# Patient Record
Sex: Female | Born: 1950 | Race: White | Hispanic: No
Health system: Western US, Academic
[De-identification: ages and names within clinical notes are randomized; demographics above are authoritative.]

## PROBLEM LIST (undated history)

## (undated) DIAGNOSIS — G43909 Migraine, unspecified, not intractable, without status migrainosus: Secondary | ICD-10-CM

## (undated) DIAGNOSIS — M84374A Stress fracture, right foot, initial encounter for fracture: Secondary | ICD-10-CM

## (undated) DIAGNOSIS — E079 Disorder of thyroid, unspecified: Secondary | ICD-10-CM

## (undated) DIAGNOSIS — K219 Gastro-esophageal reflux disease without esophagitis: Secondary | ICD-10-CM

## (undated) DIAGNOSIS — D049 Carcinoma in situ of skin, unspecified: Secondary | ICD-10-CM

## (undated) DIAGNOSIS — F32A Depression, unspecified: Secondary | ICD-10-CM

## (undated) DIAGNOSIS — Z8719 Personal history of other diseases of the digestive system: Secondary | ICD-10-CM

## (undated) DIAGNOSIS — F5101 Primary insomnia: Secondary | ICD-10-CM

## (undated) DIAGNOSIS — R109 Unspecified abdominal pain: Secondary | ICD-10-CM

## (undated) DIAGNOSIS — K5900 Constipation, unspecified: Secondary | ICD-10-CM

## (undated) DIAGNOSIS — F419 Anxiety disorder, unspecified: Secondary | ICD-10-CM

## (undated) DIAGNOSIS — R002 Palpitations: Secondary | ICD-10-CM

## (undated) DIAGNOSIS — N390 Urinary tract infection, site not specified: Secondary | ICD-10-CM

## (undated) DIAGNOSIS — M810 Age-related osteoporosis without current pathological fracture: Secondary | ICD-10-CM

## (undated) DIAGNOSIS — M1990 Unspecified osteoarthritis, unspecified site: Secondary | ICD-10-CM

## (undated) DIAGNOSIS — E349 Endocrine disorder, unspecified: Secondary | ICD-10-CM

## (undated) DIAGNOSIS — R519 Headache, unspecified: Secondary | ICD-10-CM

## (undated) DIAGNOSIS — J45909 Unspecified asthma, uncomplicated: Secondary | ICD-10-CM

## (undated) HISTORY — PX: SURGICAL HX OTHER: 99

## (undated) HISTORY — DX: Disorder of thyroid, unspecified: E07.9

## (undated) HISTORY — DX: Primary insomnia: F51.01

## (undated) HISTORY — PX: URETHRAL DILATION/DVIU: SHX5207

## (undated) HISTORY — DX: Endocrine disorder, unspecified: E34.9

## (undated) HISTORY — DX: Urinary tract infection, site not specified: N39.0

## (undated) HISTORY — DX: Depression, unspecified: F32.A

## (undated) HISTORY — DX: Stress fracture, right foot, initial encounter for fracture: M84.374A

## (undated) HISTORY — PX: PR UNLISTED PROCEDURE FOOT/TOES: 28899

## (undated) HISTORY — DX: Gastro-esophageal reflux disease without esophagitis: K21.9

## (undated) HISTORY — DX: Unspecified abdominal pain: R10.9

## (undated) HISTORY — DX: Carcinoma in situ of skin, unspecified: D04.9

## (undated) HISTORY — DX: Personal history of other diseases of the digestive system: Z87.19

## (undated) HISTORY — DX: Age-related osteoporosis without current pathological fracture: M81.0

## (undated) HISTORY — DX: Unspecified asthma, uncomplicated: J45.909

## (undated) HISTORY — DX: Headache, unspecified: R51.9

## (undated) HISTORY — DX: Constipation, unspecified: K59.00

## (undated) HISTORY — DX: Palpitations: R00.2

## (undated) HISTORY — DX: Unspecified osteoarthritis, unspecified site: M19.90

## (undated) HISTORY — PX: PR UNLISTED PROCEDURE LEG/ANKLE: 27899

## (undated) HISTORY — PX: PR UNLISTED PROCEDURE HANDS/FINGERS: 26989

## (undated) HISTORY — DX: Anxiety disorder, unspecified: F41.9

## (undated) MED ORDER — OMEPRAZOLE 20 MG OR CPDR
DELAYED_RELEASE_CAPSULE | ORAL | 0 refills | Status: AC
Start: 2022-04-28 — End: ?

## (undated) MED ORDER — ESTRADIOL 10 MCG VA TABS
10.0000 ug | ORAL_TABLET | VAGINAL | 0 refills | Status: AC
Start: 2022-02-23 — End: ?

## (undated) MED ORDER — ESTRADIOL 10 MCG VA TABS
10.0000 ug | ORAL_TABLET | VAGINAL | Status: AC
Start: 2022-03-13 — End: ?

## (undated) MED ORDER — ESTRADIOL 10 MCG VA TABS
10.0000 ug | ORAL_TABLET | VAGINAL | 0 refills | Status: AC
Start: 2022-01-05 — End: ?

## (undated) MED FILL — Zoledronic Acid IV Soln 5 MG/100ML: INTRAVENOUS | Qty: 100 | Status: AC

## (undated) MED FILL — Ceftriaxone Sodium in Dextrose Inj 20 MG/ML: INTRAVENOUS | Qty: 50 | Status: AC

---

## 2009-09-11 ENCOUNTER — Other Ambulatory Visit: Payer: Self-pay

## 2009-09-18 ENCOUNTER — Other Ambulatory Visit: Payer: Self-pay

## 2012-05-25 ENCOUNTER — Other Ambulatory Visit: Payer: Self-pay

## 2012-06-12 ENCOUNTER — Encounter (INDEPENDENT_AMBULATORY_CARE_PROVIDER_SITE_OTHER): Payer: BLUE CROSS/BLUE SHIELD

## 2012-06-18 ENCOUNTER — Telehealth (INDEPENDENT_AMBULATORY_CARE_PROVIDER_SITE_OTHER): Payer: Self-pay | Admitting: Foot & Ankle Surgery

## 2012-06-18 NOTE — Telephone Encounter (Signed)
Message copied by Jon Billings on Tue Jun 18, 2012 12:04 PM  ------       Message from: Kimber Relic       Created: Tue Jun 18, 2012 11:31 AM       Regarding: Upcoming surgery       Contact: [phone]         She's feeling a little better, but still wants the surgery on June 10. If you have additional questions you can call her, she was just told to call in and tell you a pain status.              Ok to leave message  ------

## 2012-06-18 NOTE — Telephone Encounter (Signed)
Message copied by Jon Billings on Tue Jun 18, 2012 12:05 PM  ------       Message from: Kimber Relic       Created: Tue Jun 18, 2012 11:31 AM       Regarding: Upcoming surgery       Contact: [phone]         She's feeling a little better, but still wants the surgery on June 10. If you have additional questions you can call her, she was just told to call in and tell you a pain status.              Ok to leave message  ------

## 2012-06-25 ENCOUNTER — Telehealth (INDEPENDENT_AMBULATORY_CARE_PROVIDER_SITE_OTHER): Payer: Self-pay | Admitting: Foot & Ankle Surgery

## 2012-06-25 NOTE — Telephone Encounter (Signed)
Message copied by Jon Billings on Tue Jun 25, 2012  2:29 PM  ------       Message from: Lance Morin       Created: Fri Jun 21, 2012  9:32 AM       Contact: [phone]         Pt would like your help to get a commode to use post operatively.              She also wanted to inform you re a boyle that she has had that is now going away.  ------

## 2012-06-25 NOTE — Telephone Encounter (Signed)
Pt went to primary doc to check on boil on groin, it is infected and she suspects staff, results will be back on Thursday  Discussion with Bertell Maria was that she wait on upcoming surgery till cleared from infectious control physician. She will call   On Thursday with update and we will do surgery when she is better

## 2012-06-28 ENCOUNTER — Telehealth (INDEPENDENT_AMBULATORY_CARE_PROVIDER_SITE_OTHER): Payer: Self-pay | Admitting: Foot & Ankle Surgery

## 2012-07-02 NOTE — Telephone Encounter (Signed)
She is now scheduled for sx on June 24th.

## 2012-07-08 ENCOUNTER — Encounter (INDEPENDENT_AMBULATORY_CARE_PROVIDER_SITE_OTHER): Payer: BLUE CROSS/BLUE SHIELD | Admitting: Foot & Ankle Surgery

## 2012-07-16 ENCOUNTER — Other Ambulatory Visit (INDEPENDENT_AMBULATORY_CARE_PROVIDER_SITE_OTHER): Payer: Self-pay | Admitting: Foot & Ankle Surgery

## 2012-07-17 LAB — PATHOLOGY, SURGICAL

## 2012-07-18 ENCOUNTER — Encounter (INDEPENDENT_AMBULATORY_CARE_PROVIDER_SITE_OTHER): Payer: BLUE CROSS/BLUE SHIELD | Admitting: Foot & Ankle Surgery

## 2012-07-22 ENCOUNTER — Ambulatory Visit (INDEPENDENT_AMBULATORY_CARE_PROVIDER_SITE_OTHER): Payer: BLUE CROSS/BLUE SHIELD | Admitting: Foot & Ankle Surgery

## 2012-07-22 VITALS — BP 102/61 | HR 66 | Ht 69.0 in | Wt 150.0 lb

## 2012-07-22 MED ORDER — DOCUSATE SODIUM 100 MG OR CAPS
ORAL_CAPSULE | ORAL | Status: DC
Start: 2012-07-22 — End: 2012-08-08

## 2012-07-22 MED ORDER — HYDROCODONE-ACETAMINOPHEN 5-325 MG OR TABS
ORAL_TABLET | ORAL | Status: DC
Start: 2012-07-22 — End: 2014-09-26

## 2012-07-22 MED ORDER — HYDROXYZINE HCL 25 MG OR TABS
ORAL_TABLET | ORAL | Status: DC
Start: 2012-07-22 — End: 2014-09-26

## 2012-07-22 NOTE — Progress Notes (Signed)
POST-OP VISIT NOTE    LATERALITY & PROCEDURE: left    POST-OP TIME: 6 days    SUBJECTIVE:      Patient Comments:    How is Patient Doing? Well    Pain:  1-3 out of 10   Swelling: Mild   Progressing As Expected: YES   Taking Pain Medications?  YES   WB Status:  NWB w/ posterior splint w/ crutches &  roller-aid       OBJECTIVE:     Patient Status: Feeling Well   Neurologic Status: Intact   Vascular Status: Intact   Dermatologic: Incisions healing well w/ no dehiscence   Edema: Mild   Hematoma: None   Joint ROM:  Good    Alignment: Good    Palpation: PTP at incision sites only      IMPRESSION:  -Post left foot surgery, progressing well.    PLAN:  -Applied sterile compression dressing  -Continue NWB  -Continue strategies to decrease swelling  -RTC in 1 1/2 weeks for suture removal & x-ray review  -Rx- Colace for constipation.

## 2012-07-24 ENCOUNTER — Other Ambulatory Visit (INDEPENDENT_AMBULATORY_CARE_PROVIDER_SITE_OTHER): Payer: Self-pay | Admitting: Foot & Ankle Surgery

## 2012-07-24 ENCOUNTER — Telehealth (INDEPENDENT_AMBULATORY_CARE_PROVIDER_SITE_OTHER): Payer: Self-pay | Admitting: Foot & Ankle Surgery

## 2012-07-24 MED ORDER — MS CONTIN 15 MG OR TBCR
EXTENDED_RELEASE_TABLET | ORAL | Status: DC
Start: 2012-07-24 — End: 2014-09-26

## 2012-07-24 NOTE — Telephone Encounter (Signed)
Patient called in wanting a refill on her medications but she has some issues with the current prescription. She was taking Morphine and Hydrocodone but was asked to discontinue Morphine and double up on Hydrocodone. She said that she is now nauseous and the medication is not lasting through the night. Please call her to discuss medication regimen and then issue a refill based on the medications prescribed.    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

## 2012-07-25 ENCOUNTER — Encounter (INDEPENDENT_AMBULATORY_CARE_PROVIDER_SITE_OTHER): Payer: BLUE CROSS/BLUE SHIELD | Admitting: Foot & Ankle Surgery

## 2012-07-29 ENCOUNTER — Telehealth (INDEPENDENT_AMBULATORY_CARE_PROVIDER_SITE_OTHER): Payer: Self-pay | Admitting: Foot & Ankle Surgery

## 2012-07-29 MED ORDER — HYDROXYZINE HCL 25 MG OR TABS
ORAL_TABLET | ORAL | Status: DC
Start: 2012-07-29 — End: 2014-09-26

## 2012-07-29 MED ORDER — HYDROCODONE-ACETAMINOPHEN 5-325 MG OR TABS
ORAL_TABLET | ORAL | Status: DC
Start: 2012-07-29 — End: 2014-09-26

## 2012-07-29 NOTE — Telephone Encounter (Signed)
Wants more norco and atarax, pain getting under control but still needing some every 6 hours.  Called both into her pharamcy in [place].

## 2012-07-29 NOTE — Telephone Encounter (Signed)
She is calling for a refill of their medication(s). She will run out tomorrow afternoon and needs these filled ASAP    Name of medication: Hydrocodone-Acetaminophen 5-325 MG Oral Tab  AND  HydrOXYzine HCl 25 MG Oral Tab    Pharmacy: [facility] in [place]  Pharmacy phone number: [phone]    *She also has questions about her wrap.

## 2012-07-31 ENCOUNTER — Encounter (INDEPENDENT_AMBULATORY_CARE_PROVIDER_SITE_OTHER): Payer: Self-pay | Admitting: Foot & Ankle Surgery

## 2012-08-01 ENCOUNTER — Ambulatory Visit (INDEPENDENT_AMBULATORY_CARE_PROVIDER_SITE_OTHER): Payer: BLUE CROSS/BLUE SHIELD | Admitting: Foot & Ankle Surgery

## 2012-08-01 VITALS — BP 93/48 | HR 61 | Ht 69.0 in | Wt 150.0 lb

## 2012-08-01 DIAGNOSIS — Z9889 Other specified postprocedural states: Secondary | ICD-10-CM | POA: Insufficient documentation

## 2012-08-01 NOTE — Progress Notes (Signed)
POST-OP VISIT NOTE    LATERALITY & PROCEDURE: left foot & ankle surgery    POST-OP TIME: 2 weeks    SUBJECTIVE:      Patient Comments:    How is Patient Doing? Well    Pain:  2 out of 10   Swelling: Mild   Progressing As Expected: YES   Taking Pain Medications?  YES   WB Status:  NWB w/ posterior splint w/ crutches &  roller-aid       OBJECTIVE:     Patient Status: Feeling Well   Neurologic Status: Intact   Vascular Status: Intact   Dermatologic: Incisions healing well w/ no dehiscence   Edema: Mild   Hematoma: None   Joint ROM:  Good    Alignment: Good    Palpation: minimal    X-ray Ankle 3+ Vw Left    08/01/2012  XRAYS ANKLE, LEFT, WB- AP, MORTISE- post surgical changes noted to medial malleolus post bone cyst excision w/ autogenous bone (allo- and auto-) packing, soft tissue swelling evident     X-ray Foot 3+ Vw Left    08/01/2012  XRAYS FOOT, NWB- AP, LAT, OBLIQUE- anatomic alignment, post surgical changes noted without complication, os peroneum is absent, soft tissue swelling evident     IMPRESSION:  -Post left foot & ankle surgery, progressing well.    PLAN:  -Reviewed x-rays w/ patient  -Removed partial sutures  -Applied sterile compression dressing w steri-strip reinforcement  -Continue NWB  -Continue strategies to decrease swelling  -RTC in 1 week for additional suture removal.

## 2012-08-02 ENCOUNTER — Telehealth (INDEPENDENT_AMBULATORY_CARE_PROVIDER_SITE_OTHER): Payer: Self-pay | Admitting: Foot & Ankle Surgery

## 2012-08-02 NOTE — Telephone Encounter (Signed)
Pt is concerned, she is 2 wks post op with Dr. Bertell Maria and has a low grade fever. Pls advise what she should do.

## 2012-08-02 NOTE — Telephone Encounter (Signed)
Pt called again, she took her temp again and it seemed fine now. No need to call. She is aware that she can reach the doctor via our answering service if her temp returns over the weekend.

## 2012-08-08 ENCOUNTER — Ambulatory Visit (INDEPENDENT_AMBULATORY_CARE_PROVIDER_SITE_OTHER): Payer: BLUE CROSS/BLUE SHIELD | Admitting: Foot & Ankle Surgery

## 2012-08-08 VITALS — BP 140/70 | HR 60 | Ht 69.0 in | Wt 150.0 lb

## 2012-08-08 MED ORDER — DOCUSATE SODIUM 100 MG OR CAPS
ORAL_CAPSULE | ORAL | Status: DC
Start: 2012-08-08 — End: 2014-09-26

## 2012-08-08 NOTE — Progress Notes (Signed)
POST-OP VISIT NOTE     LATERALITY & PROCEDURE: left foot & ankle surgery     POST-OP TIME: 3 weeks     SUBJECTIVE:   Patient Comments:   How is Patient Doing? Well   Pain: 0 out of 10   Swelling: Mild   Progressing As Expected: YES   Taking Pain Medications? YES   WB Status: NWB w/ posterior splint w/ crutches & roller-aid     OBJECTIVE:   Patient Status: Feeling Well   Neurologic Status: Intact   Vascular Status: Intact   Dermatologic: Incisions healing well w/ no dehiscence   Edema: Mild   Hematoma: None   Joint ROM: Good   Alignment: Good   Palpation: minimal    IMPRESSION:   -Post left foot & ankle surgery, progressing well.     PLAN:   -Removed remaining sutures   -Applied sterile compression dressing w steri-strip reinforcement   -Continue NWB   -Continue strategies to decrease swelling   -RTC in 3 weeks for FU.

## 2012-08-29 ENCOUNTER — Ambulatory Visit (INDEPENDENT_AMBULATORY_CARE_PROVIDER_SITE_OTHER): Payer: BLUE CROSS/BLUE SHIELD | Admitting: Foot & Ankle Surgery

## 2012-08-29 VITALS — BP 130/76 | HR 56 | Ht 69.0 in | Wt 150.0 lb

## 2012-08-29 NOTE — Progress Notes (Signed)
POST-OP VISIT NOTE   LATERALITY & PROCEDURE: left foot & ankle surgery   POST-OP TIME: 6 weeks       SUBJECTIVE:   Patient Comments: Doing "very well"  How is Patient Doing? Well   Pain: 0 out of 10   Swelling: Mild   Progressing As Expected: YES   Taking Pain Medications? YES   WB Status: NWB w/ posterior splint w/ crutches & roller-aid     OBJECTIVE:   Patient Status: Feeling Well   Neurologic Status: Intact   Vascular Status: Intact   Dermatologic: Incisions healing well w/ no dehiscence   Edema: Mild   Hematoma: None   Joint ROM: Good   Alignment: Good   Palpation: minimal     IMPRESSION:   -Post left foot & ankle surgery, progressing well.     PLAN:   -Removed remaining sutures   -Applied sterile compression dressing w steri-strip reinforcement   -Progress to SLW boot   -Continue strategies to decrease swelling  -Begin formal PT program   -RTC in 6 weeks for FU.

## 2012-08-30 ENCOUNTER — Encounter (INDEPENDENT_AMBULATORY_CARE_PROVIDER_SITE_OTHER): Payer: Self-pay | Admitting: Foot & Ankle Surgery

## 2012-09-02 ENCOUNTER — Telehealth (INDEPENDENT_AMBULATORY_CARE_PROVIDER_SITE_OTHER): Payer: Self-pay | Admitting: Foot & Ankle Surgery

## 2012-09-02 NOTE — Telephone Encounter (Signed)
Tried walking with boot after 6 week p/o, did well, few days later pain with walking in boot, recommended cutting way back in activity level,   Use walker mostly, ice, elevate, anti inflam,rest, try small steps, see how does in next few days, call here Friday, if not better.  Will get her in to see bouche

## 2012-09-02 NOTE — Telephone Encounter (Signed)
Cassie Contreras is a post op patient and was seen and given a walking boot on Thursday.  Everything was great up until Sunday morning.  She could barely take a step on Sunday.  Jasper started feeling sharp, stabbing pain in her ankle.  Please call and advise.

## 2012-09-09 ENCOUNTER — Telehealth (INDEPENDENT_AMBULATORY_CARE_PROVIDER_SITE_OTHER): Payer: Self-pay | Admitting: Foot & Ankle Surgery

## 2012-09-09 NOTE — Telephone Encounter (Signed)
Tovah had surgery on her left foot back in June; she is still wearing her black boot and it's causing her pain. She would like to know if it's ok to wear the blue boot. Her next appointment isn't until 9/22 and she would like to know if Dr. Bertell Maria wants her to come in sooner. Please give her a call.

## 2012-09-10 NOTE — Telephone Encounter (Signed)
Shin splints, achil sore, after walking on it, doing PT,  (could walk better first 3 days, then pain) told her not to walk in blue boot  Try black one with crutches , then athletic shoes, will keep [place] posted.   Maybe switch out black boot, could be a bad boot,  Pt working with her

## 2012-09-19 ENCOUNTER — Encounter (INDEPENDENT_AMBULATORY_CARE_PROVIDER_SITE_OTHER): Payer: Self-pay | Admitting: Foot & Ankle Surgery

## 2012-10-14 ENCOUNTER — Ambulatory Visit (INDEPENDENT_AMBULATORY_CARE_PROVIDER_SITE_OTHER): Payer: BLUE CROSS/BLUE SHIELD | Admitting: Foot & Ankle Surgery

## 2012-10-14 VITALS — BP 105/60 | Ht 69.0 in | Wt 150.0 lb

## 2012-10-14 NOTE — Progress Notes (Signed)
POST-OP VISIT NOTE    LATERALITY & PROCEDURE: left foot and ankle surgery    POST-OP TIME: 3 months    SUBJECTIVE:      Patient Comments: anterior ankle tightness, poorly localized foot pain and stiffness   How is Patient Doing? Well    Pain:  0-3 out of 10   Swelling: Mild   Progressing As Expected: YES   Taking Pain Medications?  NO   WB Status:  normal shoes, normal activities, tolerance for walking is 15 minutes    OBJECTIVE:     Patient Status: Feeling Well   Neurologic Status: Intact   Vascular Status: Intact   Dermatologic: Incisions healed well   Edema: Mild   Hematoma: None   Joint ROM:  Good    Alignment: Good    Palpation: no pain    XRAYS:     X-ray Ankle 3+ Vw Left    10/14/2012   XRAY ANKLE, WB- AP, MORTISE, LATERAL- anatomic alignment, post surgical changes noted medial malleolus with intact joint space, small cystic change noted on mortise view, no complicatons noted, soft tissue swelling noted, no anterior spurring    X-ray Foot 2 Vw Left    10/14/2012   XRAY FOOT, WB- AP, OBLIQUE- os peroneum note present, otherwise anatomic alignment without problems noted    IMPRESSION:  -Post left foot & ankle surgery, progressing well.  -Anterior ankle swelling, r/o ankle impingement  -Poorly localized FF/MF stiffness and discomfort, unknown etiology    PLAN:  -Continue PT for aggressive joint mobilization  -Continue strategies to decrease swelling  -RTC in 3 months for FU

## 2012-11-07 ENCOUNTER — Telehealth (INDEPENDENT_AMBULATORY_CARE_PROVIDER_SITE_OTHER): Payer: Self-pay | Admitting: Foot & Ankle Surgery

## 2012-11-07 NOTE — Telephone Encounter (Signed)
SUBJECT: General Message   REASON FOR REQUEST: Referral to PT     MESSAGE: Patient is requesting that a new referral be faxed to [facility].     Ph: [phone]  Fax: [phone]    Return Call:  No call back needed

## 2012-11-08 NOTE — Telephone Encounter (Signed)
Done

## 2012-12-02 ENCOUNTER — Ambulatory Visit (INDEPENDENT_AMBULATORY_CARE_PROVIDER_SITE_OTHER): Payer: BLUE CROSS/BLUE SHIELD | Admitting: Foot & Ankle Surgery

## 2012-12-02 VITALS — BP 124/67 | HR 59 | Ht 69.0 in | Wt 150.0 lb

## 2012-12-02 NOTE — Progress Notes (Signed)
[id number]

## 2012-12-03 NOTE — Progress Notes (Signed)
Cassie Contreras, Cassie Contreras          [id number]          12/02/2012        HISTORY OF PRESENT ILLNESS     This patient returns for followup concerning her left foot.  We had perform recent surgery on this patient's left ankle with good result, though she has some residual numbness on the lateral aspect of her foot.  Other than that, she has done well.  She has been gradually returning to athletic activities in a slow gradual manner.  She has been working with the physical therapist in that regard.  Five days ago, she was doing an exercise involving a leg press and during the exercise she had a sudden onset of pain involving her left 4th metatarsal.  After that exercise, she had difficulty walking and especially at toe off.  She has had pain, swelling and dysfunction, favoring her left side.  The physical therapist who was working with her felt she may have a stress fracture or stress reaction related to her left foot and she presents back now for followup.  In addition to that complaint, she is also getting some anterior ankle pain.  We have previously diagnosed her with an anterior ankle impingement but no previous injections have been rendered.    PHYSICAL EXAMINATION     MUSCULOSKELETAL:  Examination utilizing a skier's cross test, she is indeed limited on the left ankle versus the right related to the amount of dorsiflexion that she gets and is limited on the left side versus the right side.  No effusion or swelling noted.  Good stability of the ankle, pain on the mouth of the ankle involving anterior, central and lateral aspects of the ankle to direct palpation.  She has exquisite pain lateral diaphyseal area 4th metatarsal with minimal swelling.  Good range, good strength, especially of extensor tendon.  Unable to load her foot.  She walks with a significant antalgic gait, propulsive gait.    IMAGING     We did obtain screening films of her foot, and she does have a longitudinal oriented 1 cm periosteal reaction involving  the lateral diathesis proximally consistent with a healing stress fracture involving her 4th metatarsal of the left foot.  This is well seen on AP, oblique views of her foot.  On the lateral view, we can see post-surgical changes involving her tibial plafond without complication.  Her anterior ankle reveals no osseous spurring, despite the fact that she has symptoms of anterior ankle impingement.    IMPRESSION     The patient is status post left ankle surgery, progressing well with residual numbness along the course of the sural nerve.  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    PLAN     Patient education:  Discussed the nature of the problem and options concerning management.  Dispense the walking boot for her.  She can use the previous boot she had, it is a flat walker low to the ground and she should be able to function well.  That and an Ace wrap will be helpful for a 3 to 6 week period.  Proceed with the first of a series of injections for anterior ankle impingement utilizing 3 mL of 0.5% Marcaine, 1 mL of Kenalog 10.  She tolerated the injection well.  We will check her back in 2 weeks before she goes  to, I believe, [place] as she leaves the day after Thanksgiving.  So, it would be nice to get a second injection in her left ankle, as that would optimize her care.  We do anticipate a good result for her with her stress fracture as well.  Overall, the patient progressing well otherwise.

## 2012-12-09 ENCOUNTER — Encounter (INDEPENDENT_AMBULATORY_CARE_PROVIDER_SITE_OTHER): Payer: Self-pay | Admitting: Registered Nurse

## 2012-12-18 ENCOUNTER — Ambulatory Visit (INDEPENDENT_AMBULATORY_CARE_PROVIDER_SITE_OTHER): Payer: BLUE CROSS/BLUE SHIELD | Admitting: Foot & Ankle Surgery

## 2012-12-18 NOTE — Progress Notes (Signed)
Patient returns for 2nd injection left ankle.  First injection worked well providing good relief.  Proceeded with 2nd injection left ankle.    PROCEDURE:  Intra-Articular Ankle Injection    Laterality: left    After antsieptic prep and application of Ethyl Chloride Spray, an intra-articular ankle injection was administered using an anteromedial approach.  4cc's of 0.5% Marcaine was combined with 1cc (10 mg) of triamcinolone. The patient tolerated injection well and was informed about  possibility of post-injection discomfort or "flare" and given treatment recommendations including relative rest, ice and oral Tylenol (not to exceed 3 grams (3000mg s) per day  Discussed other potential side effects as well from this procedure and answered questions patient had.    IMPRESSION:  -Anterior ankle impingment, left  -Stress fx 4th, left  -Post ankle and foot surgery, left     PLAN:   -RTC in 3 months when return from [place]  -Use SLW as needed next 2-4 weeks for stress fx 4th

## 2013-01-29 ENCOUNTER — Ambulatory Visit (INDEPENDENT_AMBULATORY_CARE_PROVIDER_SITE_OTHER): Payer: BLUE CROSS/BLUE SHIELD | Admitting: Foot & Ankle Surgery

## 2013-01-29 DIAGNOSIS — M8448XD Pathological fracture, other site, subsequent encounter for fracture with routine healing: Secondary | ICD-10-CM

## 2013-01-29 DIAGNOSIS — M25872 Other specified joint disorders, left ankle and foot: Secondary | ICD-10-CM

## 2013-01-29 DIAGNOSIS — M7750 Other enthesopathy of unspecified foot: Secondary | ICD-10-CM

## 2013-01-29 DIAGNOSIS — Z9889 Other specified postprocedural states: Secondary | ICD-10-CM

## 2013-01-29 DIAGNOSIS — M84375D Stress fracture, left foot, subsequent encounter for fracture with routine healing: Secondary | ICD-10-CM

## 2013-01-29 NOTE — Progress Notes (Signed)
[id number]

## 2013-01-30 NOTE — Progress Notes (Signed)
Cassie Contreras, Reanne M          [id number]          01/29/2013      SUBJECTIVE     This patient presents back to the clinic for followup concerning her left foot and ankle.  She was last seen on 12/18/2012.  The patient at that time had anterior ankle impingement.  She had a stress fracture of her fourth metatarsal and she was post foot and ankle surgery, generally doing well.  The patient continues to relate issues with her left foot and ankle.  She states that the pain from her fourth metatarsal has resolved.  She has a poorly localized complaint of pain, stiffness involving her central mid foot region.  She states as if it feels like a constricting band surrounds her dorsal mediolateral midfoot.  She is without recent history of injury or trauma. Also relates a neurogenic symptom of tingling and numbness along the course of the superficial peroneal nerve branches and the sural nerve branches, and again she relates no history of injury or trauma.  She states these problems have been present since the surgical procedure. From her surgical procedure on her left ankle with  incisions medially on her ankle and laterally on her foot, she has done reasonably well.  She has some altered sensation along the course of the sural nerve in vicinity of the lateral incision, but the superficial peroneal nerve involvement is not related to any surgical incisional placement.  We did obtain an x-ray of her left foot at this time,  AP, oblique and lateral view, and there is no evidence of any pathology involving her fourth metatarsal at this time. Cursory exam of her left foot revealed no obvious pathology, has good alignment, well-healed incisions.  I did reproduce the tingling she gets along the course of the superficial peroneal nerve and also the sural nerve, but not sure as to the etiology of this problem. Certainly on close questioning, she denies any neurogenic symptoms of her hands or contralateral foot.    Her exam is relatively  normal at this time.    ASSESSMENT AND PLAN     At this point, we proceeded with an additional injection, this being the third injection, 4 mL of 0.5% Marcaine, 1 mL of Kenalog 10 into her left ankle, anteromedial approach.  She will continue with physical therapy and will return here in 4 to 6 weeks for followup.  We will continue to follow her and hopefully she will be able to progress to normal activities as she did prior to this apparent stress fracture she had with her fourth metatarsal.  We would anticipate a good result for her with conservative care.

## 2013-02-04 NOTE — Addendum Note (Signed)
Addended by: Reece AgarBOUCHE, RICHARD THOMAS on: 02/04/2013 02:20 PM     Modules accepted: Orders

## 2013-03-05 ENCOUNTER — Ambulatory Visit (INDEPENDENT_AMBULATORY_CARE_PROVIDER_SITE_OTHER): Payer: BLUE CROSS/BLUE SHIELD | Admitting: Foot & Ankle Surgery

## 2013-03-05 DIAGNOSIS — Z9889 Other specified postprocedural states: Secondary | ICD-10-CM

## 2013-03-05 DIAGNOSIS — M25872 Other specified joint disorders, left ankle and foot: Secondary | ICD-10-CM

## 2013-03-05 DIAGNOSIS — M7750 Other enthesopathy of unspecified foot: Secondary | ICD-10-CM

## 2013-03-05 NOTE — Progress Notes (Signed)
[id number]

## 2013-03-05 NOTE — Progress Notes (Signed)
Cassie Contreras, Keturah M          [id number]          03/05/2013      REASON FOR VISIT     This patient returns back to the clinic for followup concerning her left foot and ankle.    INTERVAL HISTORY     Overall, she is improved.  She has resorted to a gradual return to full activity, and this has been helpful for her.  This was recommended by her primary care physician, apparently.  The patient has done well with the previous injections for her anterior ankle and this part of her problem seems to be better. She can assume a skier's crouch position with minimal to no pain.  She is getting improvement as well from her apparent neurogenic symptoms from her intermediate dorsal cutaneous nerve of the superficial peroneal nerve.  Likely this was due to a compression episode postoperatively.  She is 7 months now status post left ankle surgery and continues to do well with that.  The patient still has residual discomfort involving her fourth metatarsal despite normal x-rays. This was the site of previous stress fracture and I feel that that should heal with a slow gradual return to full weightbearing activities.    IMPRESSION     Our impression at this point is resolving anterior ankle impingement.  She is 7 months status post left ankle surgery, progressing well.  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  Likely this could be a stress reaction.    PLAN     At this point, return to clinic in 3 months for followup.  She is to maintain a consistent progressive loading program and I feel the patient should do well.

## 2013-06-04 ENCOUNTER — Encounter (INDEPENDENT_AMBULATORY_CARE_PROVIDER_SITE_OTHER): Payer: BLUE CROSS/BLUE SHIELD | Admitting: Foot & Ankle Surgery

## 2013-06-05 ENCOUNTER — Ambulatory Visit (INDEPENDENT_AMBULATORY_CARE_PROVIDER_SITE_OTHER): Payer: BLUE CROSS/BLUE SHIELD | Admitting: Foot & Ankle Surgery

## 2013-06-05 DIAGNOSIS — M25872 Other specified joint disorders, left ankle and foot: Secondary | ICD-10-CM

## 2013-06-05 DIAGNOSIS — Z9889 Other specified postprocedural states: Secondary | ICD-10-CM

## 2013-06-05 DIAGNOSIS — M7750 Other enthesopathy of unspecified foot: Secondary | ICD-10-CM

## 2013-06-05 DIAGNOSIS — M25579 Pain in unspecified ankle and joints of unspecified foot: Secondary | ICD-10-CM

## 2013-06-05 DIAGNOSIS — M25572 Pain in left ankle and joints of left foot: Secondary | ICD-10-CM

## 2013-06-05 NOTE — Progress Notes (Signed)
[id number]

## 2013-06-05 NOTE — Progress Notes (Signed)
Cassie Contreras, Ayano M          [id number]          06/05/2013      HISTORY OF PRESENT ILLNESS     Patient returns to the clinic.  It has been about 3 months since I have seen her with her last visit being 03/05/2013.  At this point, the patient is doing very well concerning her left ankle.  The previous problems we encountered on 03/05/2013 have pretty much improved or resolved significantly, that being anterior ankle impingement.  Though she gets that, it is intermittent and mildly a problem now.  She is able to assume a crouch position with minimal to no pain involving her left ankle, and she is quite pleased with that.  She also had neuropraxia of the superficial peroneal nerve.  This is rarely a problem.  Sometimes with constricting shoe wear she can get some residual symptoms, but overall that is improving and just about nonexistent, and then, finally, she had a stress reaction status post stress fracture.  She had symptoms of her fourth metatarsal status post stress fracture, and this has resolved.  The patient walks 50 minutes every other day and is really without pain at this time or any complaint.    PHYSICAL EXAMINATION     GENERAL:  Patient in no acute distress.  NEUROVASCULAR:  Status grossly intact.  DERMATOLOGIC:  Well-healed incisions.  MUSCULOSKELETAL:  No effusions or swellings are noted.  She has excellent range of motion, excellent strength, adequate ankle joint dorsiflexion.  Able to assume a skier's crouch position without difficulty, and she has no ankle instability on provocative testing.  In the stance position, she has a perpendicular heel with well-maintained arch.    IMPRESSION     Our impression at this point is patient is progressing well status post left ankle surgery and is without complaint at this time.  She is status post neuropraxia superficial peroneal nerve, status post ankle impingement, and status post stress reaction fourth metatarsal.  She will return as necessary for followup, and  I did underscore the importance of consistency in her exercise program, especially the weightbearing exercise program.

## 2013-09-01 ENCOUNTER — Telehealth (INDEPENDENT_AMBULATORY_CARE_PROVIDER_SITE_OTHER): Payer: Self-pay | Admitting: Foot & Ankle Surgery

## 2013-09-01 NOTE — Telephone Encounter (Signed)
Cassie Contreras was seen last November for her L foot by Dr. Bertell MariaBouche.  She thinks she may have another stress fracture.  She was at a wedding and was on her feet all day.  She was wearing flats with no arch support.  Cassie Contreras is experiencing sharp pain in her L foot.  Please call her and advise.

## 2013-09-01 NOTE — Telephone Encounter (Signed)
Recommended pt go to ER, walk in, [place]iatrist [place]([facility]) or get back in boot and stay off of it until feeling better.

## 2013-09-17 ENCOUNTER — Telehealth (INDEPENDENT_AMBULATORY_CARE_PROVIDER_SITE_OTHER): Payer: Self-pay | Admitting: Foot & Ankle Surgery

## 2013-09-17 NOTE — Telephone Encounter (Signed)
Apt made

## 2013-09-17 NOTE — Telephone Encounter (Signed)
Patient had surgery with Dr. Bertell Maria on her left foot last year and is now in horrible pain.  Patient was wondering if she can be seen sooner than the end of September.  Please advise, thanks.

## 2013-09-23 ENCOUNTER — Telehealth (INDEPENDENT_AMBULATORY_CARE_PROVIDER_SITE_OTHER): Payer: Self-pay | Admitting: Foot & Ankle Surgery

## 2013-09-23 ENCOUNTER — Ambulatory Visit (INDEPENDENT_AMBULATORY_CARE_PROVIDER_SITE_OTHER): Payer: BLUE CROSS/BLUE SHIELD | Admitting: Foot & Ankle Surgery

## 2013-09-23 DIAGNOSIS — M792 Neuralgia and neuritis, unspecified: Secondary | ICD-10-CM

## 2013-09-23 DIAGNOSIS — IMO0002 Reserved for concepts with insufficient information to code with codable children: Secondary | ICD-10-CM

## 2013-09-23 NOTE — Telephone Encounter (Signed)
Patient called checking on her prescription for Tramadol.  Please call patient once prescription is ready to pick up.

## 2013-09-23 NOTE — Progress Notes (Signed)
[id number]

## 2013-09-24 ENCOUNTER — Encounter (INDEPENDENT_AMBULATORY_CARE_PROVIDER_SITE_OTHER): Payer: Self-pay

## 2013-09-24 MED ORDER — TRAMADOL HCL 50 MG OR TABS
50.0000 mg | ORAL_TABLET | Freq: Four times a day (QID) | ORAL | Status: DC | PRN
Start: 2013-09-24 — End: 2014-09-26

## 2013-09-24 NOTE — Progress Notes (Signed)
[facility] script to [facility], they will contact pt to dispense

## 2013-09-24 NOTE — Progress Notes (Signed)
Cassie Contreras, Cassie Contreras          [id number]          09/23/2013      HISTORY OF PRESENT ILLNESS     This patient is a 63 year old female who presents with her husband, who is well known to me.  She presents with persistent pain, left foot and ankle.  The patient states that since having her previous foot and ankle surgery approximately a year ago she has had some altered sensation to her feet that has been quite manageable.  Three weeks ago, she did attend a wedding and wore a pair of flat, poorly supportive shoes and from this episode, she developed a sudden onset of neurogenic symptoms of her left foot and ankle.  She describes tingling, burning numbing night pain and muscle spasms.  It affects the top of her foot and the anterior aspect of the ankle.  She had no specific history of injury or trauma other than her wearing these shoes.  Up to that point, she was walking 45 minutes per day without problem.  The patient did see Dr. Oswaldo Done on 09/02/2013, and a followup visit on 09/17/2013.  I do have Dr. Jacqualin Combes chart notes and these were thoroughly reviewed.  At the initial visit on 09/02/2013,  X-rays were taken and Dr. Oswaldo Done felt the patient had a stress reaction to her 3rd metatarsal, left foot.  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  Treatment was initiated for the patient including the use of a walking boot.  The patient has been utilizing the walking boot and has persistent continued neurogenic symptoms involving the distribution of the superficial peroneal nerve.  The patient states that other than that episode where she went to the wedding in these flat shoes and the fact that she has been wearing this walking boot with some constricting straps to cross the anterior ankle dorsum of foot, no other history of injury is noted.  The patient does relate chronic low back pain with apparently issues at the L4-L5 level where  she was told by one of her physicians that she had degenerative joint disease.  Again, she does relate some neurogenic symptoms since that her foot/ankle surgery a year ago on the left side, but this exacerbation of her neurogenic symptoms occurred 3 weeks ago without obvious history of injury or trauma.  She denies any other neurogenic symptoms of her body other than her chronic low back pain.  This history is poorly related by the patient and I am not sure if she had any specific diagnostic studies done but on initial questioning, there does not appear to have been any studies done such as an MRI or EMG nerve conduction study to evaluate her back.    The patient is anxious to wean out of the boot.  She was apparently told by Dr. Oswaldo Done that she could wean out if she felt okay.  She did try and wean out and had some exacerbation of her neurogenic symptoms most recently, so she got back in the boot.  She is questioning as to what course of action she should take at this time.    PHYSICAL EXAMINATION     GENERAL:  The patient in no acute distress.  VASCULAR:  Status grossly intact.  NEUROLOGIC:  She has altered sensation in the distribution of the sural nerve, medial and lateral intermediate branches; medial and intermediate dorsal  cutaneous nerve branches are altered.  She has intact saphenous, sural, and posterior tibial nerve distribution.  She has loss of S1 reflex on the left side preserved with the Achilles bilaterally and the right S1 reflex.  She does have atrophy of her left calf.  We measured her left calf circumference, left versus right, 6 cm and 12 cm; on the left side it is 6 cm, 35 cm circumference, 35 on the right as well; 12 cm below the popliteal crease, she was 33.5 cm on the left and 34.5 cm on the right.  So, there is a 1 cm difference left versus right, with the left being visually slightly atrophic at this level.  She has good range of motion, excellent strength with no obvious weakness  appreciated.  We were able to appreciate the medial dorsal cutaneous nerve which was quite large and a smaller intermediate dorsal cutaneous nerve and there were reproducible symptoms with palpation of the nerve with proximal and distal radiation about the area of the instep.  Also, she had exacerbation of these neurogenic symptoms in the dorsal metatarsophalangeal joint region over the 2nd and 3rd rays.  She has no obvious swelling on examination.  She is tender over the metatarsal shafts but no specific areas of pinpoint pain are noted.  The patient is able to load her heel with assistance from the other side and independent of the other side; she can even perform some light jumping with no reproduction of symptoms.    IMAGING     We did review the previous x-ray taken per Dr. Oswaldo Done, the most recent film, which reveals some wash out of the bones and no obvious osseous pathology is noted other than the loss of bone density.    IMPRESSION     The patient has the onset of neurogenic symptoms possibly aggravated by shoe wear or possibly use of a walking boot in the distribution of the superficial peroneal nerve branches, left foot only.  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  She has loss of S1 reflex and muscle atrophy to support that view.    PLAN     Patient education.  Discussed the nature of the problem and options concerning management.  Based on Dr. Jacqualin Combes recommendations, I would keep her in the boot for a week just to assure that this stress reaction is healing appropriately.  I cautioned her against overtightening the straps which could cause aggravation of her nerve symptoms, or neuropraxia so to speak, of the superficial peroneal nerve branches.  I did discuss the tincture of time without constriction should improve week to  week and month to month.  I discussed simple conservative measures.  Maybe a night splint would be beneficial to keep tension off of the nerves with a neutral dorsiflexed position.  She is to avoid any kind of constricting strap that cross the instep area and anterior aspect of her ankle.  The patient would be a candidate for tramadol to be taken as needed for discomfort and if she has continued neurogenic symptoms, we could try a course of gabapentin or Neurontin.  I would give this a good 3-4 weeks period of time and see if she improves.  If she does not improve and has persistent symptoms, I would see Gustavo Lah for evaluation of her back  to see if her foot symptoms in the L5 distribution are related to an L4-L5 radicular problem.  Should she have any problems, she will return earlier.  We would anticipate improvement with conservative measures and we will see her back appropriately.    TIME STATEMENT     40 minutes were spent face-to-face contact with patient, greater then 50% of which was spent counseling and coordinating care.

## 2013-10-04 ENCOUNTER — Telehealth (INDEPENDENT_AMBULATORY_CARE_PROVIDER_SITE_OTHER): Payer: Self-pay | Admitting: Foot & Ankle Surgery

## 2013-10-04 NOTE — Telephone Encounter (Signed)
Patient called and wanted to know if Dr. Bertell Maria could recommend a Physical Therapist that we normally refer to. I did suggest that we often refer to [facility]. She said she tried [facility] and didn't like it. She asked if he knew anywhere in [place] and if not she said she is willing to go to [facility].

## 2013-10-06 NOTE — Telephone Encounter (Signed)
Referred her to [place] , she took number and will call

## 2013-10-09 ENCOUNTER — Telehealth (INDEPENDENT_AMBULATORY_CARE_PROVIDER_SITE_OTHER): Payer: Self-pay | Admitting: Foot & Ankle Surgery

## 2013-10-09 NOTE — Telephone Encounter (Signed)
Pt called and would like to go forward with scheduling an EMG. Dr. Bertell Maria recommended this for the peripheral neuropathy in her feet. She indicates that this would be for her L Foot and also possibly for the R Foot. Additionally she has been experiencing numbness in her hands so she is not sure which limbs should be included in the EMG. Would Dr. Bertell Maria write an order for this?

## 2013-10-13 NOTE — Telephone Encounter (Signed)
Referred to dr April Manson for work up neurapathy, to discuss emg study

## 2013-10-14 ENCOUNTER — Telehealth (INDEPENDENT_AMBULATORY_CARE_PROVIDER_SITE_OTHER): Payer: Self-pay | Admitting: Foot & Ankle Surgery

## 2013-10-14 NOTE — Telephone Encounter (Signed)
Cassie Contreras has a ingrown toenail on her left foot. She noticed this morning that her toe is infected. She would like to know what to do. She informed me that Dr. Bertell Maria referred her to Dr. Neil Crouch to be checked for neuropathy, she has an appointment at the end of October. Please give patient a call, she would like to know if she has to come in or can something be prescribed. Please advise.

## 2013-10-14 NOTE — Telephone Encounter (Signed)
Left message for pt to call me back if not before 2pm today, tomorrow am

## 2013-10-20 ENCOUNTER — Ambulatory Visit (INDEPENDENT_AMBULATORY_CARE_PROVIDER_SITE_OTHER): Payer: BLUE CROSS/BLUE SHIELD | Admitting: Foot & Ankle Surgery

## 2013-10-20 DIAGNOSIS — M792 Neuralgia and neuritis, unspecified: Secondary | ICD-10-CM

## 2013-10-20 DIAGNOSIS — IMO0002 Reserved for concepts with insufficient information to code with codable children: Secondary | ICD-10-CM

## 2013-10-20 DIAGNOSIS — L600 Ingrowing nail: Secondary | ICD-10-CM | POA: Insufficient documentation

## 2013-10-20 NOTE — Progress Notes (Signed)
[id number]      PARTIAL MATRICECTOMY:     Digit Involved: Hallux      Laterality: LEFT     Nail Borders: lateral    A local anesthetic block was administered of involved hallux using a field block technique. A total of 4 cc's of 0.5% Marcaine was used for left hallux block.  After painting the hallux with Betadine solution, a penrose drain was applied circumferentially aroung base of hallux and this tourniquet was maintained in place for duration of procedure. The offending portion of nail was then removed in standard fashion using sterile instrumentation.  Matricectomy was then performed on the involved border/s using Negative Galvanic Current at a setting of 10 milliamperes for a duration of 5 minutes per border. The treated nail groove was then lavaged first with sterile 5% acetic acid solution for chemical neutralization followed by normal saline. A dressing was applied to hallux consisting of betadine-soaked adaptic, 4 x 4 gauze, 1" gauze and a Coban wrap. During dressing application the penrose drain tourniquet was released.& removed from involved halllux(ces).  Patient was given verbal & written post-operative instructions. Extra-strength Tylenol ( ) was recommended for discomfort but should not exceed 3 grams (3000 mgs) per day. A follow-up appointment was scheduled.Marland Kitchen

## 2013-10-20 NOTE — Progress Notes (Signed)
REWA, WEISSBERG          [id number]          10/20/2013        HISTORY OF PRESENT ILLNESS     This patient is a 63 year old female who returns back to the clinic for followup concerning multiple issues involving her lower extremities.  She states her neurogenic symptoms are stable, though the patient has had neurogenic symptoms now for 1 year.  On close questioning, she does relate neurogenic symptoms involving her hands and both of her lower extremities.  On the left side, she has neurogenic symptoms involving the anterolateral leg and dorsal lateral foot in the distribution of the common peroneal nerve.  She did take nitrofurantoin antibiotic a year ago for one year for a bladder issue she had and certainly there is cause and effect there related to nitrofurantoin causing neuropathic symptoms, but nonetheless, her neuropathic symptoms are now stable.  I did discuss with her previously specifics of peripheral neuropathy, etiologies, potential causes and treatment implications.  We do recommend that this patient see Dr. Lennox Grumbles for validation that this is happening to her and for treatment options.  Her main complaint on this visit is ingrown nail fibular border, left hallux, 63-month duration.  It has been a recurrent problem.  She has had purulence, most recently about 3 to 4 weeks ago emanate from the lateral nail border, left foot.  She is here for definitive treatment options related to this chronic recurrent problem.    PHYSICAL EXAMINATION     Related to her left toe, there is some slight redness, pain along the lateral nail border.  No evidence of purulence or drainage is noted.  She has an incurvated nail on the right side that is minimally symptomatic.  Main issue is lateral border, left hallux nail.    IMPRESSION     The patient has probable peripheral neuropathy, possibly aggravated or brought on by 1 year of nitrofurantoin about a year ago.  She has chronic recurrent ingrown nail fibular border, left  hallux.    PLAN     Discussed options concerning her hallux nail, fibular border.  Recommended partial matrixectomy.  Did perform that at this time.  Gave her recommendations related to her neuropathy.  Gave her some Metanx samples that she can consider as a supplement and possible treatment for her neuropathic symptoms.  Did recommend that she check this out online first and consider these samples as a trial treatment, as she may get symptomatic relief.  I will check her back in 4-6 weeks for followup concerning her nail procedure, which was performed without complication.  She will eventually see Dr. Lennox Grumbles for her neuropathic symptoms.

## 2013-11-26 ENCOUNTER — Encounter (INDEPENDENT_AMBULATORY_CARE_PROVIDER_SITE_OTHER): Payer: BLUE CROSS/BLUE SHIELD | Admitting: Foot & Ankle Surgery

## 2013-12-03 ENCOUNTER — Telehealth (INDEPENDENT_AMBULATORY_CARE_PROVIDER_SITE_OTHER): Payer: Self-pay | Admitting: Foot & Ankle Surgery

## 2013-12-03 NOTE — Telephone Encounter (Signed)
Cassie Contreras went and saw Cassie Contreras and was prescribed Gabapentin. She states that Cassie Contreras told her to call Cassie Contreras's office to ask about the side effects and dosage. She has become sensitive to light and sleepy. Please advise.

## 2013-12-03 NOTE — Telephone Encounter (Signed)
Side effects are normal , she is taking 100 mg, three times a day, she is to increase every couple of weeks till she can do 600/900 a day,  We will try to monitor this over the phone or change meds all together

## 2014-09-02 ENCOUNTER — Telehealth (INDEPENDENT_AMBULATORY_CARE_PROVIDER_SITE_OTHER): Payer: Self-pay | Admitting: Family Medicine

## 2014-09-02 NOTE — Telephone Encounter (Signed)
LVM with patient. CCR please route patient to [facility] when patient calls to schedule appointment.

## 2014-09-02 NOTE — Telephone Encounter (Signed)
(  TEXTING IS AN OPTION FOR [facility] CLINICS ONLY)  Is this a [facility] clinic? Yes. What is the mobile number we can use to get a hold of you via text? [phone]        RETURN CALL: Detailed message on voicemail only      SUBJECT:  Appointment Request     REASON FOR REQUEST/SYMPTOMS: Establish care/appointment   REFERRING PROVIDER: Juel Burrow In [facility]  REQUEST APPOINTMENT WITH: Clemmie Krill  REQUESTED DATE: Any day except Tuesdays, TIME: Please coordinate with patient  UNABLE TO APPOINTMENT BECAUSE: Patient stated Dr. Juel Burrow in [facility] spoke to Dr. [name] and arranged for her and husband to establish care with Dr. [name]. Per SOP, Dr. He only accepting new patients if family already established with him. Spouse has first appointment to establish on 9/3. Patient is looking to make an appointment with [facility] and establish care. CCR not authorized to establish patient with Hei. Patient is requesting a call back from the clinic to schedule.

## 2014-09-02 NOTE — Telephone Encounter (Signed)
Yes this is fine.

## 2014-09-02 NOTE — Telephone Encounter (Signed)
Routing to Dr. [name] Do you want to take on this pt? Please route to FD

## 2014-09-03 NOTE — Telephone Encounter (Signed)
Appointment made with Dr. [name]. Closing TE.

## 2014-09-26 ENCOUNTER — Encounter (INDEPENDENT_AMBULATORY_CARE_PROVIDER_SITE_OTHER): Payer: Self-pay | Admitting: Family Medicine

## 2014-09-26 ENCOUNTER — Ambulatory Visit (INDEPENDENT_AMBULATORY_CARE_PROVIDER_SITE_OTHER): Payer: PPO | Admitting: Family Medicine

## 2014-09-26 VITALS — BP 103/65 | HR 69 | Temp 97.6°F | Resp 14 | Ht 69.5 in | Wt 155.0 lb

## 2014-09-26 DIAGNOSIS — G5782 Other specified mononeuropathies of left lower limb: Secondary | ICD-10-CM

## 2014-09-26 DIAGNOSIS — M79671 Pain in right foot: Secondary | ICD-10-CM

## 2014-09-26 MED ORDER — LIDOCAINE 4 % EX PTCH
1.0000 | MEDICATED_PATCH | Freq: Every day | CUTANEOUS | Status: DC | PRN
Start: 2014-09-26 — End: 2015-04-15

## 2014-09-26 NOTE — Progress Notes (Signed)
Reason for visit: foot pain       Cervical screening/PAP:   Mammo: Last one at [facility]  Colon Screen: Had 5 years ago per pt  Have seen a specialist since your last visit: NO     HM Due:   Health Maintenance   Topic Date Due   . Hepatitis C Screen  01-21-1951   . HIV Screen  [birth year]   . Pap Smear  11/19/1970   . Cholesterol Test  11/19/1994   . Colon Cancer Screen w/ FOBT/FIT  11/19/1999   . Mammogram  11/19/1999   . Influenza Vaccine (1) 09/24/2014   . Tetanus Vaccine  03/24/2022   . Zoster Vaccine  Completed           No future appointments.

## 2014-09-26 NOTE — Patient Instructions (Signed)
"  Buddy tape" your right 1st and second toes

## 2014-09-29 ENCOUNTER — Encounter (INDEPENDENT_AMBULATORY_CARE_PROVIDER_SITE_OTHER): Payer: Self-pay | Admitting: Family Medicine

## 2014-09-29 DIAGNOSIS — G5782 Other specified mononeuropathies of left lower limb: Secondary | ICD-10-CM | POA: Insufficient documentation

## 2014-09-29 NOTE — Progress Notes (Signed)
ASSESSMENT/PLAN:  (M79.671) Right foot pain  (primary encounter diagnosis)  Plan: X-RAY FOOT 3+ VW RIGHT        Normal xray, may be sprain, adv'd buddy taping and stiffer soled shoes.  f/u prn no improvement.     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         END OF VISIT SUMMARY  PLEASE SEE BELOW FOR SUPPORTING DOCUMENTATION AND DETAILS.   -----------------------------------------------------------------------------------------------------------------    SUBJECTIVE:  Cassie Contreras is a 64 year old female presenting today for the following issue(s):    Increased RIGHT foot pain near the base of 1st toe.  No trauma or injury. Worse w standing/walking and o/w weight bearing.  No swelling.  Shoes w more cushion helps.    Also chronic LEFT lateral foot pain w a long history that ultimately concluded w dx of sural nerve injury from foot surgery based on EMG testing.  She's been taking gabapentin  at bedtime which helps and recalls that taking more made her drowsy.  Daytime symptoms not as bad but can occ have shooting pain.    ROS: no recent fever or rash or edema or echymosis    Past Medical History   Diagnosis Date   . Heart palpitations    . Metatarsal stress fracture of right foot      TX FROM PAPER CHART       Past Surgical History   Procedure Laterality Date   . Tonsilectomy       Childhood      Social History   Substance Use Topics   . Smoking status: Never Smoker    . Smokeless tobacco: Not on file   . Alcohol Use: 0.6 oz/week     1 Glasses of wine per week    No family history on file.  Patient Active Problem List    Diagnosis Date Noted   . 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     . S/P foot & ankle surgery, left [Z98.89] 08/01/2012   . 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 Left ankle pain [M25.572]      TX FROM PAPER CHART            OBJECTIVE:  GEN:  The patient is pleasant, alert, cooperative.  No acute distress.  EXTR: without overlying erythema, edema or echymosis of bilateral feet; healed surgical scars on LEFT; mild-mod tenderness in RIGHT 1st metatarsal joint w no tenderness distally or proximally and distal sensation intact.      RIGHT foot xray performed today personally reviewed by me reveals: no fx or dislocation     -----------------------------------------------------------------------------------------------------------------  ALSO SEE THE BEGINNING OF THIS NOTE FOR ASSESSMENT AND PLAN  -----------------------------------------------------------------------------------------------------------------

## 2014-11-03 ENCOUNTER — Other Ambulatory Visit: Payer: Self-pay

## 2015-04-15 ENCOUNTER — Encounter (INDEPENDENT_AMBULATORY_CARE_PROVIDER_SITE_OTHER): Payer: Self-pay | Admitting: Family Medicine

## 2015-04-15 ENCOUNTER — Ambulatory Visit (INDEPENDENT_AMBULATORY_CARE_PROVIDER_SITE_OTHER): Payer: PPO | Admitting: Family Medicine

## 2015-04-15 VITALS — BP 100/64 | HR 65 | Temp 97.8°F | Resp 20 | Wt 155.2 lb

## 2015-04-15 DIAGNOSIS — R000 Tachycardia, unspecified: Secondary | ICD-10-CM

## 2015-04-15 DIAGNOSIS — M5410 Radiculopathy, site unspecified: Secondary | ICD-10-CM

## 2015-04-15 DIAGNOSIS — N3001 Acute cystitis with hematuria: Secondary | ICD-10-CM

## 2015-04-15 DIAGNOSIS — R300 Dysuria: Secondary | ICD-10-CM

## 2015-04-15 LAB — PR U/A AUTO DIPSTICK ONLY, ONSITE
Bilirubin, Urine: NEGATIVE
Glucose, Urine: NEGATIVE mg/dL
Ketones, URN: NEGATIVE mg/dL
Nitrite, URN: NEGATIVE
Protein: NEGATIVE mg/dL
Specific Gravity, Urine: 1.015 (ref 1.005–1.030)
Urobilinogen, URN: 0.2 E.U./dL (ref 0.2–1.0)
pH, URN: 7 (ref 5.0–8.0)

## 2015-04-15 MED ORDER — NITROFURANTOIN MONOHYD MACRO 100 MG OR CAPS
100.0000 mg | ORAL_CAPSULE | Freq: Two times a day (BID) | ORAL | Status: AC
Start: 2015-04-15 — End: 2015-04-22

## 2015-04-15 NOTE — Progress Notes (Signed)
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  advised to track circumstances around recurrence, if any, to determine what she may avoid to minimize recurrence.  Consider PT if keeps recurring.    (R30.0) Dysuria; (N30.01) Acute cystitis with hematuria  Plan: U/A AUTO DIPSTICK ONLY, ONSITE, Nitrofurantoin         Monohyd Macro (MACROBID) 100 MG Oral Cap        Early sign of UTI. H/o recurrent UTI's in the past.  Given allergies will rx macrobid     (R00.0) Tachycardia  Plan: normal exam, perhaps her FitBit is not as accurate a measuring device.  Also advised to check manual pulse esp paying attention to regularity during exercise.  Also advised that metoprolol and tamsulosin may reduce BP more esp during or after exercise that may stimulate her pulse to increase but it's been rx'd by her previous physician so I advised her to enquire there.      END OF VISIT SUMMARY  PLEASE SEE BELOW FOR SUPPORTING DOCUMENTATION AND DETAILS.   -----------------------------------------------------------------------------------------------------------------    SUBJECTIVE:  Cassie Contreras is a 65 year old female presenting today for the following issue(s):    4-6 weeks of on-off RIGHT lateral leg stinging pain that are quick and can be intense described as "grab the ceiling".  But the past 3-4 days this has not recurred.  No trauma or injury. Seems to happen after a workout followed by standing at work.    Also w her FitBit she's noticed her HR goes up to 160-170 during workouts. She doesn't feel unusual SOB or intolerance or CP during workouts, recovery seems normal although sometimes after a particularly hard workout w trainer she feels like she needs a nap afterwards.   She's been put on metoprolol for palpitations.    Also having some vague symptoms of UTI lately, no fever.    ROS: no light-headedness or CP or N/V/D    Patient Active Problem  List    Diagnosis Date Noted   . 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     . S/P foot & ankle surgery, left [Z98.890] 08/01/2012   . 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  No acute distress.  CV: Regular rate and rhythm. S1 and S2 normal, no murmurs, clicks, gallops or rubs.   MUSCULOSKELETAL: no lumbar tenderness. Slr negative in spine position but her hamstrings are quite tight bilaterally.  DTR 2+ bilateral knees and ankles.      UA: 2+ LE, trace blood, neg nitrite.  See specific lab report for details.     -----------------------------------------------------------------------------------------------------------------  ALSO SEE THE BEGINNING OF THIS NOTE FOR ASSESSMENT AND PLAN  -----------------------------------------------------------------------------------------------------------------

## 2015-04-15 NOTE — Patient Instructions (Signed)
Take your pulse manually at various times of your exercise to check both rate and regularity    Keep monitoring the circumstances around the RIGHT thigh pain

## 2015-04-15 NOTE — Progress Notes (Signed)
Reason for visit: pt. Is here for the pain in right leg on side since a month ago.       Cervical screening/PAP:due  Mammo: done (virgenia mason , 09/16)  Colon Screen: done , 01/17, virgenia mason   Have seen a specialist since your last visit: NO     HM Due:   Health Maintenance   Topic Date Due   . Hepatitis C Screen  1950/07/15   . HIV Screen  1950/07/15   . Pap Smear  11/19/1970   . Cholesterol Test  11/19/1994   . Colon Cancer Screen w/ FOBT/FIT  11/19/1999   . Mammogram  11/19/1999   . DEXA Scan  11/19/2014   . Pneumococcal Vaccine (1 of 2 - PCV13) 11/19/2014   . Tetanus Vaccine  03/24/2022   . Zoster Vaccine  Completed   . Influenza Vaccine  Completed           Future Appointments  Date Time Provider Department Center   04/15/2015 8:30 AM Hei, Darlyn Chamberhomas Kou, MD [facility]

## 2015-04-21 ENCOUNTER — Encounter (INDEPENDENT_AMBULATORY_CARE_PROVIDER_SITE_OTHER): Payer: Medicare Other | Admitting: Family Medicine

## 2015-04-22 ENCOUNTER — Encounter (INDEPENDENT_AMBULATORY_CARE_PROVIDER_SITE_OTHER): Payer: Self-pay | Admitting: Family Medicine

## 2015-04-22 ENCOUNTER — Ambulatory Visit (INDEPENDENT_AMBULATORY_CARE_PROVIDER_SITE_OTHER): Payer: PPO | Admitting: Family Medicine

## 2015-04-22 VITALS — BP 97/61 | HR 60 | Temp 98.0°F | Resp 16 | Wt 155.0 lb

## 2015-04-22 DIAGNOSIS — M79672 Pain in left foot: Secondary | ICD-10-CM

## 2015-04-22 NOTE — Progress Notes (Signed)
ASSESSMENT/PLAN:  (N62.952(M79.672) Left foot pain  (primary encounter diagnosis)  Plan: XR FOOT 3 VW LEFT        No s/s of fx at this time.  Adv'd stiff soled shoes and relative guarding for next few days and resume exercises slowly afterwards.  f/u prn no improvement.        END OF VISIT SUMMARY  PLEASE SEE BELOW FOR SUPPORTING DOCUMENTATION AND DETAILS.   -----------------------------------------------------------------------------------------------------------------    SUBJECTIVE:  Cassie Contreras is a 65 year old female presenting today for the following issue(s):    2 days of sharp LEFT distal dorsal foot pain when wearing tighter shoes or w pressure.  No problems walking but afraid to raise off heels.  Been exercising more vigorously so concerned about stress fx, which she's reportedly had in the past.    ROS: no swelling/rash, has chronic LEFT lateral foot numbness    Patient Active Problem List    Diagnosis Date Noted   . 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     . S/P foot & ankle surgery, left [Z98.890] 08/01/2012   . 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  No acute distress.  EXTR: LEFT foot without overlying erythema, edema or echymosis, but positive point tenderness over 3rd MTP area. FROM.  Ankle mortis stable.    foot xray performed today personally reviewed by me reveals: no fx or dislocation     -----------------------------------------------------------------------------------------------------------------  ALSO SEE THE BEGINNING OF THIS NOTE FOR ASSESSMENT AND PLAN  -----------------------------------------------------------------------------------------------------------------

## 2015-04-22 NOTE — Progress Notes (Signed)
Reason for visit: L foot pain        Cervical screening/PAP:no  Mammo: no  Colon Screen: no  Have seen a specialist since your last visit: NO     HM Due:   Health Maintenance   Topic Date Due   . Hepatitis C Screen  1950-05-09   . HIV Screen  1950-05-09   . Pap Smear  11/19/1970   . Cholesterol Test  11/19/1994   . Colon Cancer Screen w/ FOBT/FIT  11/19/1999   . Mammogram  11/19/1999   . DEXA Scan  11/19/2014   . Pneumococcal Vaccine (1 of 2 - PCV13) 11/19/2014   . Tetanus Vaccine  03/24/2022   . Zoster Vaccine  Completed   . Influenza Vaccine  Completed           Future Appointments  Date Time Provider Department Center   04/22/2015 8:30 AM Hei, Darlyn Chamberhomas Kou, MD [facility]

## 2015-08-13 ENCOUNTER — Encounter (INDEPENDENT_AMBULATORY_CARE_PROVIDER_SITE_OTHER): Payer: Self-pay | Admitting: Family Medicine

## 2015-08-13 NOTE — Progress Notes (Signed)
Called and left voicemail for patient to schedule a medicare annual wellness visit.       CCR please help patient schedule an appointment for a medicare annual wellness visit.  Please put this in note when scheduled "MEDICARE AWV/HCC/Care Gap update"

## 2015-10-21 ENCOUNTER — Ambulatory Visit (INDEPENDENT_AMBULATORY_CARE_PROVIDER_SITE_OTHER): Payer: PPO | Admitting: Family Medicine

## 2015-11-10 ENCOUNTER — Ambulatory Visit (INDEPENDENT_AMBULATORY_CARE_PROVIDER_SITE_OTHER): Payer: PPO | Admitting: Family Medicine

## 2015-11-10 ENCOUNTER — Encounter (INDEPENDENT_AMBULATORY_CARE_PROVIDER_SITE_OTHER): Payer: Self-pay | Admitting: Family Medicine

## 2015-11-10 VITALS — BP 99/54 | HR 61 | Temp 96.3°F | Resp 16 | Ht 67.75 in | Wt 152.0 lb

## 2015-11-10 DIAGNOSIS — N390 Urinary tract infection, site not specified: Secondary | ICD-10-CM | POA: Insufficient documentation

## 2015-11-10 DIAGNOSIS — R002 Palpitations: Secondary | ICD-10-CM | POA: Insufficient documentation

## 2015-11-10 DIAGNOSIS — Z1239 Encounter for other screening for malignant neoplasm of breast: Secondary | ICD-10-CM

## 2015-11-10 DIAGNOSIS — N952 Postmenopausal atrophic vaginitis: Secondary | ICD-10-CM

## 2015-11-10 DIAGNOSIS — Z79890 Hormone replacement therapy (postmenopausal): Secondary | ICD-10-CM

## 2015-11-10 DIAGNOSIS — Z0000 Encounter for general adult medical examination without abnormal findings: Secondary | ICD-10-CM

## 2015-11-10 DIAGNOSIS — Z1231 Encounter for screening mammogram for malignant neoplasm of breast: Secondary | ICD-10-CM

## 2015-11-10 DIAGNOSIS — M8588 Other specified disorders of bone density and structure, other site: Secondary | ICD-10-CM | POA: Insufficient documentation

## 2015-11-10 DIAGNOSIS — Z6823 Body mass index (BMI) 23.0-23.9, adult: Secondary | ICD-10-CM

## 2015-11-10 LAB — LIPID PANEL
Cholesterol (LDL): 71 mg/dL (ref <130)
Cholesterol/HDL Ratio: 2.1
HDL Cholesterol: 75 mg/dL (ref >39)
Non-HDL Cholesterol: 83 mg/dL (ref 0–159)
Total Cholesterol: 158 mg/dL (ref <200)
Triglyceride: 58 mg/dL (ref <150)

## 2015-11-10 NOTE — Progress Notes (Signed)
Reason for visit: wellness        Cervical screening/PAP: due  Mammo: due  Colon Screen:  due  Have seen a specialist since your last visit: NO     HM Due:   Health Maintenance   Topic Date Due   . Hepatitis C Screen  15-Jun-1950   . HIV Screen  11/18/1964   . Pap Smear  11/19/1970   . Cholesterol Test  11/19/1994   . Colon Cancer Screen w/ FOBT/FIT  11/19/1999   . Mammogram  11/19/1999   . DEXA Scan  11/19/2014   . Pneumococcal Vaccine (1 of 2 - PCV13) 11/19/2014   . Influenza Vaccine (1) 10/24/2015   . Tetanus Vaccine  03/24/2022   . Zoster Vaccine  Completed           No future appointments.  c    WELCOME TO MEDICARE VISIT     Cassie Contreras presents for a Welcome to Medicare visit (Initial Preventive Physical Exam).     Rooming activities:  Visual acuity testing measured: YES     Visual Acuity Screening    Right eye Left eye Both eyes   Without correction:      With correction: 20/25 20/100 20/30      Height and weight measured: YES  PHQ-2 responses entered into screening section: YES  PHQ-9 given if PHQ-2 score is >0: YES       QUESTIONNAIRE REPONSES:   DIET/EXERCISE  Is your diet well-balanced/healthy including protein, fiber, vegetables, and fruits? YES   Do you exercise regularly? YES    Type of exercise aerobics   Frequency of exercise 5 times a week     ACTIVITIES OF DAILY LIVING  In your present state of health how much difficulty do you have with the following activities:       Preparing food and eating: 0 No impairment (the person has no problem)       Bathing yourself: 0 No impairment (the person has no problem)       Getting dressed: 0 No impairment (the person has no problem)       Using the toilet: 0 No impairment (the person has no problem)       Moving around from place to place: 0 No impairment (the person has no problem)    In the past year have you fallen or had a near fall? No  Do you feel safe in your home environment? YES    Do you find yourself having trouble hearing people speak?   No  Do you wear a hearing aid/device? No    Do you have a fire extinguisher in your home?  YES  Do you have a smoke detector?  YES    DEPRESSION SCREEN and PSYCHOSOCIAL HEALTH  PHQ-2 Total Score:   0  PHQ-9 Total Score:     3) Feelings that caused you distress or interfered with your ability to get along socially with family or friends  not at all  4) Feeling stress over health, finances, relationships, or work  Several days  5) Fatigue  several days  6) Body pain  not at all    CARDIAC RISK FACTORS:  Smoker: No  Obesity: No  Diabetic: No  Known heart disease: No  Family history of heart disease: Yes  Sedentary lifestyle: Yes  Hyperlipidemia: No    Patient's questionnaire responses copied into template above by Janett BillowManpreet Kaur, CMA

## 2015-11-11 ENCOUNTER — Ambulatory Visit (HOSPITAL_BASED_OUTPATIENT_CLINIC_OR_DEPARTMENT_OTHER): Admit: 2015-11-11 | Discharge: 2015-11-11 | Disposition: A | Discharge status: Home / Self Care | Payer: Self-pay

## 2015-11-11 LAB — HEPATITIS C AB WITH REFLEX PCR: Hepatitis C Antibody w/Rflx PCR: NONREACTIVE

## 2015-11-15 DIAGNOSIS — N952 Postmenopausal atrophic vaginitis: Secondary | ICD-10-CM | POA: Insufficient documentation

## 2015-11-15 DIAGNOSIS — Z79890 Hormone replacement therapy (postmenopausal): Secondary | ICD-10-CM | POA: Insufficient documentation

## 2015-11-15 NOTE — Progress Notes (Signed)
WELCOME TO MEDICARE VISIT (IPPE)    Cassie Contreras is a 65 year old female who presents for her Welcome to Medicare Visit.    INFORMATION GATHERING:  X  I have reviewed and confirmed the patient's ROS, past medical history, past surgical history, family history and social history in Epic     The information checked below is up to date at the end of this visit:   X  Measurement of Visual Acuity  X  Current medications and supplements (including vitamins and calcium)  X  Allergies  X  Patient's questionnaire including depression screening, functional ability, hearing, fall risk and safety; diet, physical activity, and health habits    The patient questionnaire completed for today's visit is:   X transcribed into this encounter  _ scanned into EHR (Media section of Epic)    Race and ethnicity (from Demographics): White Not Hispanic or Latino     PHQ-2:    PHQ-9:       EXAM:  Vitals: BP 99/54   Pulse 61   Temp 96.3 F (35.7 C) (Temporal)   Resp 16   Ht 5' 7.75" (1.721 m)   Wt 152 lb (68.9 kg)   SpO2 99%   BMI 23.28 kg/m    Body mass index is 23.28 kg/m.   GENERAL: no acute distress  GAIT: normal      HEENT: Lids, sclerae and conjunctivae clear. External ears and canals clear. TM's normal bilaterally. Nose normal without lesions or discharge. No sinus tenderness. Oropharynx normal.  NECK: supple without palpable adenopathy. No thyroid enlargement or discrete nodule.  LUNGS: The lungs are clear to auscultation.  CV: Regular rate and rhythm. S1 and S2 normal, no murmurs, clicks, gallops or rubs.     IN-OFFICE TESTING  Visual acuity:    Visual Acuity Screening    Right eye Left eye Both eyes   Without correction:      With correction: 20/25 20/100 20/30        ECG (if done): N/A    END OF LIFE PLANNING  End-of-life planning offered but not discussed   . I advised the patient of her ability to prepare an advance directive in case of future inability to make health care decisions due to illness or injury:  YES  . I am willing to follow her wishes as expressed in the advance directive: YES  . Other discussion:        ASSESSMENT:  Cassie Contreras was seen for her Welcome to [employer] Visit (IPPE).    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      For you, I recommend the screening & preventive measures marked with a check in the list below:    Immunizations  _ Pneumococcal vaccine  X Influenza vaccine  _ Hepatitis B vaccine  _ Tetanus vaccine (at community pharmacy)  _ Shingles vaccine (at community pharmacy)    Screening tests  _ EKG for screening (covered only [facility])  X Cardiovascular screening (lipid panel)  _ Diabetes screening (blood sugar)  X Hepatitis C screening  _ Prostate serum antigen (PSA)  _ Stool testing for colorectal cancer screening   _ Pap smear and pelvic exam    Imaging  _ AAA screening ultrasound  X Mammogram for screening  X Bone density measurement  _ Lung cancer screening CT scan    Referrals  _ Colonoscopy referral for colorectal cancer screening   _ Glaucoma screening (eye exam)  _ Nutrition counseling (covered for people with diabetes, kidney disease, or within 3 years of a kidney transplant)      Please plan to have a First Annual Wellness Visit in 1 year

## 2015-11-22 ENCOUNTER — Ambulatory Visit: Payer: PPO | Attending: Family Medicine

## 2015-11-22 DIAGNOSIS — Z1231 Encounter for screening mammogram for malignant neoplasm of breast: Secondary | ICD-10-CM | POA: Insufficient documentation

## 2015-12-20 ENCOUNTER — Ambulatory Visit: Payer: PPO | Attending: Family Medicine

## 2015-12-20 DIAGNOSIS — M8588 Other specified disorders of bone density and structure, other site: Secondary | ICD-10-CM | POA: Insufficient documentation

## 2016-03-15 ENCOUNTER — Telehealth (INDEPENDENT_AMBULATORY_CARE_PROVIDER_SITE_OTHER): Payer: Self-pay | Admitting: Family Medicine

## 2016-03-15 DIAGNOSIS — N761 Subacute and chronic vaginitis: Secondary | ICD-10-CM

## 2016-03-15 NOTE — Telephone Encounter (Signed)
**  Complete Incoming Call Section as well**    RETURN CALL: Detailed message on voicemail only      ([facility] ONLY) Texting is an option for this clinic. If you would like us to use this option, which mobile phone number should we text to if we are unable to reach you?    SUBJECT: General Message     REASON FOR REQUEST: Medication Refill    MESSAGE: Patient is calling to request a refill of the      VAGIFEM 10 MCG Vaginal Tab    [facility] the [id number] [address] [place] [phone]     Last OV 11/10/2015.   Medication and pharmacy pended.

## 2016-03-16 MED ORDER — VAGIFEM 10 MCG VA TABS
10.0000 ug | ORAL_TABLET | VAGINAL | 3 refills | Status: DC
Start: 2016-03-16 — End: 2016-05-10

## 2016-03-16 NOTE — Telephone Encounter (Signed)
Contacted patient to let know that medication refill request was approved and sent the Rx to the pharmacy.

## 2016-04-05 ENCOUNTER — Encounter (INDEPENDENT_AMBULATORY_CARE_PROVIDER_SITE_OTHER): Payer: Medicare HMO | Admitting: Family Medicine

## 2016-05-02 ENCOUNTER — Encounter (INDEPENDENT_AMBULATORY_CARE_PROVIDER_SITE_OTHER): Payer: Self-pay | Admitting: Family Medicine

## 2016-05-02 NOTE — Telephone Encounter (Signed)
Please see ecare from pt

## 2016-05-03 NOTE — Telephone Encounter (Signed)
Pt returned call and is scheduled for 4/18    Closing TE

## 2016-05-03 NOTE — Telephone Encounter (Addendum)
Left voice message for patient to call back to schedule appointment to discuss heart monitor. CCR/PSR: If patient calls back, please schedule.

## 2016-05-03 NOTE — Telephone Encounter (Signed)
Please call to schedule appointment for heart rate issues.

## 2016-05-10 ENCOUNTER — Ambulatory Visit (INDEPENDENT_AMBULATORY_CARE_PROVIDER_SITE_OTHER): Payer: Medicare HMO | Admitting: Family Medicine

## 2016-05-10 VITALS — BP 97/60 | HR 51 | Temp 95.2°F | Resp 16 | Wt 152.8 lb

## 2016-05-10 DIAGNOSIS — N761 Subacute and chronic vaginitis: Secondary | ICD-10-CM

## 2016-05-10 DIAGNOSIS — Z79890 Hormone replacement therapy (postmenopausal): Secondary | ICD-10-CM

## 2016-05-10 DIAGNOSIS — Z6823 Body mass index (BMI) 23.0-23.9, adult: Secondary | ICD-10-CM

## 2016-05-10 DIAGNOSIS — R002 Palpitations: Secondary | ICD-10-CM

## 2016-05-10 MED ORDER — PREMPRO 0.3-1.5 MG OR TABS
ORAL_TABLET | ORAL | 3 refills | Status: DC
Start: 2016-05-10 — End: 2016-09-20

## 2016-05-10 MED ORDER — VAGIFEM 10 MCG VA TABS
10.0000 ug | ORAL_TABLET | VAGINAL | 3 refills | Status: DC
Start: 2016-05-11 — End: 2016-09-20

## 2016-05-10 NOTE — Progress Notes (Signed)
Reason for visit: holter monitor     Mammo: no due  Colon Screen: no due  Have seen a specialist since your last visit: NO     HM Due:   Health Maintenance   Topic Date Due    Hepatitis C Screening  02/06/1964    HIV Screening  02/05/1978    Colorectal Cancer Screening (FOBT/FIT)  02/05/2013    Influenza Vaccine (1) 10/24/2015    Diabetes Screening  05/10/2018    Lipid Disorders Screening  12/08/2019    Tetanus Vaccine  12/05/2023           No future appointments.

## 2016-05-10 NOTE — Progress Notes (Signed)
ASSESSMENT/PLAN:  (R00.2) Palpitations  (primary encounter diagnosis)  Plan: TSH with Reflexive Free T4, CBC (HEMOGRAM),         REFERRAL TO CARDIAC STRESS TEST        Unclear etio at this time but will get screening labs along w treadmill stress test to see if able to capture rhythm w exercise trigger as below.     (N76.1) Subacute vaginitis  Plan: VAGIFEM 10 MCG Vaginal Tab        refill    (Z79.890) Hormone replacement therapy  Plan: PREMPRO 0.3-1.5 MG Oral Tab        Some rebound symptoms as below. Adv'd to go back up a little to 2/week to see if symptoms abate, then after 2-3 months re-try tapering back down again to see if able to taper off HRT given its risks.       END OF VISIT SUMMARY  PLEASE SEE BELOW FOR SUPPORTING DOCUMENTATION AND DETAILS.   -----------------------------------------------------------------------------------------------------------------    SUBJECTIVE:  Cassie Contreras is a 66 year old female presenting today for the following issue(s):    Periodic palpitations and brief chest pressure triggered by exercise/running on treadmill although not every time.  She's able to finish the workout although a few times she slowed down her treadmill, and symptoms resolve within a minute or less. No assoc'd SOB or light-headedness or actual chest painfulness for these episodes.  She still has occ at rest palpitations she describes as skipping a beat but she's had these for many years and been on metoprolol.    She also reports more night sweats and poor sleep, which engender incr anxiety and poor mood esp since decreased Prempro from 3/week to 1/week.    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  No acute distress.  LUNGS: The lungs are clear to auscultation.  CV: Regular rate and rhythm. S1 and S2 normal, no murmurs, clicks, gallops or rubs.   NECK: supple without palpable adenopathy. No thyroid enlargement or discrete nodule.     2015 CT neck:  IMPRESSION:     1. Unremarkable CT scan appearance of vocal cords without asymmetry or obvious mass lesion.  2. Complex hypervascular solid thyroid gland nodule, as described, measuring 3.1 x 2.2 x 3.9 cm; biopsy recommended, unless this is known to be stable.  3. Negative for enlarged cervical lymph nodes.  -----------------------------------------------------------------------------------------------------------------  ALSO SEE THE BEGINNING OF THIS NOTE FOR ASSESSMENT AND PLAN  -----------------------------------------------------------------------------------------------------------------

## 2016-05-11 LAB — CBC (HEMOGRAM)
Hematocrit: 38 % (ref 36–45)
Hemoglobin: 13 g/dL (ref 11.5–15.5)
MCH: 31.8 pg (ref 27.3–33.6)
MCHC: 33.9 g/dL (ref 32.2–36.5)
MCV: 94 fL (ref 81–98)
Platelet Count: 341 10*3/uL (ref 150–400)
RBC: 4.09 10*6/uL (ref 3.80–5.00)
RDW-CV: 12 % (ref 11.6–14.4)
WBC: 5.72 10*3/uL (ref 4.3–10.0)

## 2016-05-11 LAB — TSH WITH REFLEXIVE FREE T4: TSH with Reflexive Free T4: 0.954 u[IU]/mL (ref 0.400–5.000)

## 2016-05-24 ENCOUNTER — Ambulatory Visit (INDEPENDENT_AMBULATORY_CARE_PROVIDER_SITE_OTHER): Payer: Medicare HMO | Admitting: Family Medicine

## 2016-05-24 ENCOUNTER — Encounter (INDEPENDENT_AMBULATORY_CARE_PROVIDER_SITE_OTHER): Payer: Self-pay | Admitting: Family Medicine

## 2016-05-24 VITALS — BP 106/63 | HR 61 | Temp 98.7°F | Resp 16 | Wt 152.0 lb

## 2016-05-24 DIAGNOSIS — Z6823 Body mass index (BMI) 23.0-23.9, adult: Secondary | ICD-10-CM

## 2016-05-24 DIAGNOSIS — Z86008 Personal history of in-situ neoplasm of other site: Secondary | ICD-10-CM

## 2016-05-24 DIAGNOSIS — Z86007 Personal history of in-situ neoplasm of skin: Secondary | ICD-10-CM

## 2016-05-24 DIAGNOSIS — N390 Urinary tract infection, site not specified: Secondary | ICD-10-CM

## 2016-05-24 MED ORDER — TAMSULOSIN HCL 0.4 MG OR CAPS
0.4000 mg | ORAL_CAPSULE | Freq: Every evening | ORAL | 3 refills | Status: DC
Start: 2016-05-24 — End: 2017-06-01

## 2016-05-24 NOTE — Progress Notes (Signed)
ASSESSMENT/PLAN:  (Z86.008) History of squamous cell carcinoma in situ of skin  (primary encounter diagnosis)  Plan: REFERRAL TO DERMATOLOGY        See order detail. Also advised her that general skin screening can be performed by me or by [facility], however, we don't provide cosmetic services, so she'll consider her options.    (N39.0) Recurrent UTI  Plan: Tamsulosin HCl (FLOMAX) 0.4 MG Oral Cap        refill       END OF VISIT SUMMARY  PLEASE SEE BELOW FOR SUPPORTING DOCUMENTATION AND DETAILS.   -----------------------------------------------------------------------------------------------------------------    SUBJECTIVE:  Cassie Contreras is a 66 year old female presenting today for the following issue(s):    Here to discuss and request referral for [facility]. H/o seeing them before for squamous cell ca as well as general skin cancer screening.  Also they've performed some cosmetic tx's for her.    ROS: deferred      OBJECTIVE:  GEN:  The patient is pleasant, alert, cooperative.  No acute distress.  SKIN: small 1x68mm SK on sternum    -----------------------------------------------------------------------------------------------------------------  ALSO SEE THE BEGINNING OF THIS NOTE FOR ASSESSMENT AND PLAN  -----------------------------------------------------------------------------------------------------------------

## 2016-05-24 NOTE — Progress Notes (Signed)
Reason for visit: Referral   Mammo: no due  Colon Screen: no due  Have seen a specialist since your last visit: NO     HM Due:   Health Maintenance   Topic Date Due    Hepatitis C Screening  02/06/1964    HIV Screening  02/05/1978    Colorectal Cancer Screening (FOBT/FIT)  02/05/2013    Influenza Vaccine (1) 10/24/2015    Diabetes Screening  05/10/2018    Lipid Disorders Screening  12/08/2019    Tetanus Vaccine  12/05/2023           No future appointments.

## 2016-06-07 ENCOUNTER — Telehealth (INDEPENDENT_AMBULATORY_CARE_PROVIDER_SITE_OTHER): Payer: Self-pay | Admitting: Family Medicine

## 2016-06-07 DIAGNOSIS — E041 Nontoxic single thyroid nodule: Secondary | ICD-10-CM

## 2016-06-07 NOTE — Telephone Encounter (Signed)
**  Complete Incoming Call Section as well**    RETURN CALL: Detailed message on voicemail only      ([facility] ONLY) Texting is an option for this clinic. If you would like us to use this option, which mobile phone number should we text to if we are unable to reach you?     SUBJECT: General Message     REASON FOR REQUEST: Referral to Thyroid U/S     MESSAGE: Pt calling stating she needs a referral [place]. Pt has a nodule in her thyroid, and Dr.Mandana called pt last week and wanted pt to make an appt to do the U/S again.     Address of Clinic: [address] in [place]

## 2016-06-12 ENCOUNTER — Telehealth (INDEPENDENT_AMBULATORY_CARE_PROVIDER_SITE_OTHER): Payer: Self-pay | Admitting: Family Medicine

## 2016-06-12 NOTE — Telephone Encounter (Signed)
Dr Anner CreteMandana Ahmadian is a Endocrine Specialist in [place].  phone [phone]    Not enough information for referral coordinator to pend order.  Will send to clinic to find  more information. Patient may need PCP appointment.

## 2016-06-12 NOTE — Telephone Encounter (Signed)
**  Complete Incoming Call Section as well**    RETURN CALL: General message OK      ([facility] ONLY) Texting is an option for this clinic. If you would like us to use this option, which mobile phone number should we text to if we are unable to reach you? NO    SUBJECT: General Message     REASON FOR REQUEST: Referral for a Thyroid US    MESSAGE: Patient would like to request a referral for a Thyroid ultra sound. Patient stated that this issue has been discussed with Dr. Maylon CosHei before.

## 2016-06-12 NOTE — Telephone Encounter (Signed)
[facility]   [facility]  [address]  [place], [place] [id number]     Phone:[phone]   Fax:[phone]

## 2016-06-12 NOTE — Telephone Encounter (Signed)
OV from 05/10/2016:  2015 CT neck:  IMPRESSION:     1. Unremarkable CT scan appearance of vocal cords without asymmetry or obvious mass lesion.  2. Complex hypervascular solid thyroid gland nodule, as described, measuring 3.1 x 2.2 x 3.9 cm; biopsy recommended, unless this is known to be stable.  3. Negative for enlarged cervical lymph nodes.  --------------------------------------------------------------------------------------------------    Please approve/sign referral for thyroid ultrasound.

## 2016-06-14 NOTE — Telephone Encounter (Signed)
Faxed referral to Dr. Duanne LimerickAhmadian's office.

## 2016-06-14 NOTE — Telephone Encounter (Signed)
Pt called back and has been scheduled  Closing TE.

## 2016-06-14 NOTE — Telephone Encounter (Signed)
1st attempt - Left message requesting patient to call back.    CCR: If patient calls back, please relay information below. If unable, please take a message and forward the TE to the appropriate Clinical Staff Pool and verify if a detailed VM can be left. If no further action needed, please close encounter.

## 2016-06-14 NOTE — Telephone Encounter (Signed)
Please inform the patient that (I have ordered the thyroid [facility] she'd indicated in a previous call last week). If patient has any additional questions regarding this message please make a follow-up appointment to discuss in more detail.  Thanks.

## 2016-06-23 ENCOUNTER — Ambulatory Visit: Payer: Medicare HMO | Attending: Cardiovascular Disease | Admitting: Cardiovascular Disease

## 2016-06-23 DIAGNOSIS — I493 Ventricular premature depolarization: Secondary | ICD-10-CM

## 2016-06-23 DIAGNOSIS — R002 Palpitations: Secondary | ICD-10-CM | POA: Insufficient documentation

## 2016-06-23 NOTE — Progress Notes (Signed)
[facility]  Stress Test Report    Type of Stress Test: Treadmill    Cassie Contreras  DOB: 05/01/1950  Age:  66 year old  Referring Physician: Dr. Maylon CosHei  Date of Study: 06/23/16   Ordering Physician: Dr. Maylon CosHei    Indications: 66 year-old woman with palpitations during exercise    Resting EKG is normal.    Protocol: Bruce  Patient exercised for 6:30 min. which is 00:30 min. into stage 3 of the Bruce protocol.  Energy expenditure was 7.7 METS.  Heart rate increased from 78 to 157 which is 101% of PMHR.  BP changed from 119/70 at baseline to 160/62 at peak stress.    Results:  1.  Exercise capacity is normal compared to active women of similar age. FAI 0%  2.  Test was terminated because of fatigue.  3.  Exercise causes no chest pain.  4.  Resting blood pressure is normal and response to exercise is normal.  5.  Rhythm during stress test is normal. There are rare PVCs.  6.  After stress, ST segment response is normal.    [facility] Treadmill Score: +6.5  Cardiovascular Risk: low    Conclusions:  Maximal stress test negative for ischemia. Rare isolated PVCs, some of which reproduced the patient's symptoms of palpitations.      Thank you Dr. Maylon CosHei for allowing us to participate in the care of Cassie AstRita Mae Templer.  Please let us know if there is anything further we can do.  Please do not hesitate to call us any time.    Manson PasseyAlec J Holley Wirt, MD, [facility]  Clinical Associate Professor of Medicine  [facility]

## 2016-07-09 NOTE — Progress Notes (Signed)
Endocrine Clinic Consult Note    ID/Chief Complaint:  Cassie Contreras is a 66 year old female here for evaluation of thyroid nodule.     HPI:  Cassie Contreras was self-referred for evaluation of thyroid nodule. I did receive copies of the outside records, which I have reviewed.     This thyroid nodule was first noted on a CT back in 2015 and looked to be 3.1 X 2.2 X 3.9 cm located at isthmus and medial left lobe of thyroid gland crossing the midline. The CT was obtained for raspy voice X 1 year and left sided "neck lump" noted on palpation. She states she received a biopsy of her nodule 3 years ago shortly after her CT and was told that it was normal. We do not have pathology results. She since then has gotten a bedside U/S Q6 months since then (she thinks she has at least had 3 U/S) and we can see progress notes dating to as late as Dec 2016 showing stability of nodule size. She also had TFTs checked 3 years ago and thinks they are normal, although she has not had TFTs checked since then.     She lived in [place] and thinks there was some news of "radiation" in the area as a child. Father had thyroid surgery but it was not malignant (thinks he had nodules). She was diagnosed with hypothyroidism as a teen and took levothyroxine for one year.     Hoarseness is intermittent but not worse. No dysphagia. No SOB positionally. Otherwise at baseline of health with hypo/hyperthyroid symptoms.     Review of Systems:  In addition to what is noted in the HPI, the remainder of a complete review of systems is negative.     Past Medical History:  Patient Active Problem List   Diagnosis   . Os peroneum syndrome of left foot   . S/P foot & ankle surgery, left   . Neuritis of left sural nerve   . Recurrent UTI   . Osteopenia of spine   . Palpitations   . Atrophic vaginitis   . Hormone replacement therapy       Allergies:  Review of patient's allergies indicates:  Allergies   Allergen Reactions   . Erythromycin    . Ibuprofen      TX FROM  PAPER CHART     . Penicillins      TX FROM PAPER CHART     . Sulfa Antibiotics      TX FROM PAPER CHART         Medications:  Outpatient Medications Prior to Visit   Medication Sig Dispense Refill   . Metoprolol Succinate ER 25 MG Oral TABLET SR 24 HR      . PREMPRO 0.3-1.5 MG Oral Tab 1 tab twice a week 36 tablet 3   . Tamsulosin HCl (FLOMAX) 0.4 MG Oral Cap Take 1 capsule (0.4 mg) by mouth at bedtime. 90 capsule 3   . VAGIFEM 10 MCG Vaginal Tab Place 1 tablet (10 mcg) into the vagina 2 times a week. 24 tablet 3     No facility-administered medications prior to visit.        Family History:  Dad with thyroid surgery as per HPI.     Social History:  Social History     Social History   . Marital status: Married     Spouse name: N/A   . Number of children: N/A   . Years of education: N/A  Occupational History   . Not on file.     Social History Main Topics   . Smoking status: Never Smoker   . Smokeless tobacco: Never Used   . Alcohol use 0.6 oz/week     1 Glasses of wine per week   . Drug use: No   . Sexual activity: Yes     Partners: Male     Other Topics Concern   . Not on file     Social History Narrative   . No narrative on file       Physical Exam:  VS: BP 105/61   Pulse 52   Wt 155 lb (70.3 kg)   BMI 23.74 kg/m    Physical Exam  GEN: pleasant, cooperative in no apparent distress  EYES: normal extra-ocular movements, no lid lag, no stare  ENMT: grossly palpable thyroid nodule w/o cervical LN appreciated   RESP: No wheeze, breathing unlabored  CVS:  rate: normal , rhythm: regular   ABDO: +BS  CNS: normal gait  SKIN: No dryness  PSYCH:  normal affect, AOx3    Data Review:  As per HPI/interval history    Assessment and Plan:  #Thyroid nodule:   -We will strive to obtain pathology results from prior; if indeed benign, no need to re-biopsy given consistency in nodule size over the last several years.   -We performed complete diagnostic thyroid U/S today and noted the aforementioned isoechoic heterogenous  thyroid nodule measuring 3.5 cm on exam today w/o suspicious features such as microcalcifications/taller than wide/irregular borders.   -Ok to RTC in 12 months for repeat U/S, if no gross changes in size/characteristics, ok to repeat another U/S in 2-3 years, and if still stable, then stop.   -We would like to check TPO antibodies and TSH today however and will f/u with pt re this, given her hx of reported hypothyroidism in the past.     Pt seen and discussed with Linus Orn  Endocrinology Fellow  [facility]

## 2016-07-10 ENCOUNTER — Encounter (HOSPITAL_BASED_OUTPATIENT_CLINIC_OR_DEPARTMENT_OTHER): Payer: Self-pay | Admitting: Endocrinology

## 2016-07-10 ENCOUNTER — Ambulatory Visit: Payer: Medicare HMO | Attending: "Endocrinology | Admitting: Endocrinology

## 2016-07-10 VITALS — BP 105/61 | HR 52 | Wt 155.0 lb

## 2016-07-10 DIAGNOSIS — Z6823 Body mass index (BMI) 23.0-23.9, adult: Secondary | ICD-10-CM

## 2016-07-10 DIAGNOSIS — E041 Nontoxic single thyroid nodule: Secondary | ICD-10-CM | POA: Insufficient documentation

## 2016-07-10 LAB — THYROID STIMULATING HORMONE: Thyroid Stimulating Hormone: 1.514 u[IU]/mL (ref 0.400–5.000)

## 2016-07-10 LAB — ANTI THYROID PEROXIDASE: Anti Thyroid Peroxidase: 28.6 [IU]/mL — ABNORMAL HIGH (ref 0.0–8.9)

## 2016-07-10 NOTE — Progress Notes (Signed)
I saw and evaluated the patient and agree with Dr.Wu's note.  Date of service: 07/10/16  Ultrasound images have been uploaded to [email or web address]

## 2016-07-12 NOTE — Progress Notes (Signed)
Positive TPO Ab consistent with Hashimoto's thyroiditis, which goes along with US findings despite normal TSH. No need for sooner follow up.

## 2016-07-13 ENCOUNTER — Encounter (HOSPITAL_BASED_OUTPATIENT_CLINIC_OR_DEPARTMENT_OTHER): Payer: Self-pay | Admitting: Endocrinology

## 2016-07-17 ENCOUNTER — Telehealth (INDEPENDENT_AMBULATORY_CARE_PROVIDER_SITE_OTHER): Payer: Self-pay | Admitting: Family Medicine

## 2016-07-17 NOTE — Telephone Encounter (Signed)
Received PA request for PREMPRO 0.3-1.5 MG Oral Tab.     [facility]   Phone # [phone]   Fax # [phone]       Reason for PA: PA Required        Insurance Name: [employer] Part D    Phone # [phone]   [id number]   Member ID # [id number]

## 2016-07-17 NOTE — Telephone Encounter (Signed)
Demographics submitted via Navinet.  Will check for clinical questions within the next 24 hours.

## 2016-07-17 NOTE — Telephone Encounter (Signed)
PA submitted via  navinet.  Will check for response in 4 business days.  If no response at that time will call insurance to check status.      Navinet Key: [id number]

## 2016-07-28 ENCOUNTER — Telehealth (INDEPENDENT_AMBULATORY_CARE_PROVIDER_SITE_OTHER): Payer: Self-pay | Admitting: Family Medicine

## 2016-07-28 DIAGNOSIS — Z711 Person with feared health complaint in whom no diagnosis is made: Secondary | ICD-10-CM

## 2016-07-28 NOTE — Telephone Encounter (Signed)
Referral pended.  Provider to complete and sign if approved.  Thanks.

## 2016-07-28 NOTE — Telephone Encounter (Signed)
This should be sent to referral pool to tee up the referral before routing to provider    Cassie CraneMenaka Henson Fraticelli, MD

## 2016-07-28 NOTE — Telephone Encounter (Signed)
Arnetha GulaStephen Lee Warf  Thomas Kou Hei, MD 40 minutes ago (9:01 AM)         This a referral to [place] Sigmund HazelSato [place] [facility] [place] for eye exam for both   Jeannett SeniorStephen & Tomasa RandRita Hanford         Patient's husband send a ecare msg requesting a referral to [facility] [place] for eye exam and care with Dr Sigmund HazelSato. Please advise.

## 2016-08-02 NOTE — Telephone Encounter (Signed)
Approval Received  Insurance: [employer] [place]    Medication: PREMPRO 0.3-1.5 MG Oral Tab   Quantity / Frequency: 36 tablets/1 tab twice a week    Dates Approved: 07/18/2016   Prior Auth. Number:      Additional Notes: PA approved  Advised the pharmacy of the PA approval     Excerpt of Approval

## 2016-08-09 NOTE — Telephone Encounter (Signed)
Sent patient ecare message letting know that Dr. Sigmund HazelSato is out of network with insurance. Waiting for patient to respond with alternative facility.

## 2016-08-14 ENCOUNTER — Ambulatory Visit (INDEPENDENT_AMBULATORY_CARE_PROVIDER_SITE_OTHER): Payer: Medicare HMO | Admitting: Internal Medicine

## 2016-08-14 VITALS — BP 138/74 | HR 54 | Temp 97.6°F | Resp 14 | Wt 151.0 lb

## 2016-08-14 DIAGNOSIS — Z6823 Body mass index (BMI) 23.0-23.9, adult: Secondary | ICD-10-CM

## 2016-08-14 DIAGNOSIS — M545 Low back pain, unspecified: Secondary | ICD-10-CM

## 2016-08-14 MED ORDER — HYDROCODONE-ACETAMINOPHEN 5-325 MG OR TABS
1.0000 | ORAL_TABLET | Freq: Four times a day (QID) | ORAL | 0 refills | Status: DC | PRN
Start: 2016-08-14 — End: 2016-09-02

## 2016-08-14 NOTE — Progress Notes (Signed)
Reason for visit:  Pt injured her tailbone from exercising        Cervical screening/PAP:  Mammo:   Colon Screen:   Have seen a specialist since your last visit: NO     HM Due:   Health Maintenance   Topic Date Due   . Influenza Vaccine (1) 10/23/2016   . Pneumococcal Vaccine (2 of 2 - PPSV23) 11/06/2016   . Medicare Annual Wellness Visit  11/09/2016   . Depression Screening (PHQ-2)  05/24/2017   . Breast Cancer Screening  11/21/2017   . Lipid Disorders Screening  11/09/2020   . Tetanus Vaccine  03/24/2022   . Colorectal Cancer Screening (Colonoscopy)  01/23/2025   . Zoster Vaccine  Completed   . Osteoporosis Screening  Completed   . Hepatitis C Screening  Completed           Future Appointments  Date Time Provider Department Center   08/14/2016 9:00 AM Dy, Sallye LatEdward Sy, MD [facility]

## 2016-08-14 NOTE — Progress Notes (Signed)
Patient comes in today for low back pain  Patient reports she was working with her exercise trainer and was pulling the straps  And pulling toward self then the strap was not secure properly according to patient and patient fell backward landing on her tailbone.  Location: coccygeal area  Onset: sudden  Quality: sharp pain but last 2 days better.  Duration: 3 days  Timing: constant   Severity: moderate  Alleviating/Exacerbating: sitting on hard surfacse and stretching can trigger pain. Sitting in regular chair, car without problem.  Associated Sx: Patient denies weakness, numbness.    ROS:   Constitutional: Negative    Cardiovascular: Negative    Respiratory: Negative    Gastrointestinal: Negative   Endocrine: Negative    Genitourinary: Negative   Musculoskeletal: As noted in HPI above   Neurological: Negative      Past Medical History    Diagnosis   . Os peroneum syndrome of left foot   . S/P foot & ankle surgery, left   . Neuritis of left sural nerve   . Recurrent UTI   . Osteopenia of spine   . Palpitations   . Atrophic vaginitis   . Hormone replacement therapy       Current Outpatient Prescriptions:   Marland Kitchen.  Metoprolol Succinate ER 25 MG Oral TABLET SR 24 HR, , Disp: , Rfl:   .  PREMPRO 0.3-1.5 MG Oral Tab, 1 tab twice a week, Disp: 36 tablet, Rfl: 3  .  Tamsulosin HCl (FLOMAX) 0.4 MG Oral Cap, Take 1 capsule (0.4 mg) by mouth at bedtime., Disp: 90 capsule, Rfl: 3  .  VAGIFEM 10 MCG Vaginal Tab, Place 1 tablet (10 mcg) into the vagina 2 times a week., Disp: 24 tablet, Rfl: 3    Physical Examination:  BP 138/74   Pulse 54   Temp 97.6 F (36.4 C) (Temporal)   Resp 14   Wt 151 lb (68.5 kg)   SpO2 100%   BMI 23.13 kg/m    General: Awake, alert, not in apparent distress   Lungs: Clear to auscultation  Heart: RRR, S1/2 normal, no S3/4   Abdomen: soft, non-distended, non-tender   Extremities: No edema  Neuro: A/A/Ox3, motor 5/5, sensation intact DTR's 2+, no abnormal reflexes noted. Gait normal.    MA Kristie  present during back exam   Back: No spine tenderness, no spasm, no rash, no crepitation, no ecchymosis or swelling.     ASSESSMENT and PLAN:   (M54.5) Acute low back pain without sciatica, unspecified back pain laterality  (primary encounter diagnosis)  Comment: Discussed with patient in length, questions answered, patient verbalized understanding.    Limitations of imaging discussed.  Plan: Hydrocodone-Acetaminophen 5-325 MG Oral Tab, XR        SACRUM COCCYX 2+ VW  Discussed medication dosage, usage, goals of therapy, and side effects. ptrep  Do not drive or operate machinery while on medication.   For severe pain use only.  Follow up prn if worse or no resolution of symptoms in 1 week   Call if signs and symptoms discussed occurs or if with problem or questions.   Patient verbalized understanding and agreed.

## 2016-08-29 ENCOUNTER — Encounter (INDEPENDENT_AMBULATORY_CARE_PROVIDER_SITE_OTHER): Payer: Self-pay | Admitting: Family Medicine

## 2016-08-29 NOTE — Telephone Encounter (Signed)
Sent ecare message informing PCP out august 13th for two weeks.

## 2016-08-29 NOTE — Telephone Encounter (Signed)
Please send ecare message to offer patient an appointment with any provider

## 2016-08-30 NOTE — Telephone Encounter (Signed)
Sent eCare message to schedule with first available provider for patient's time/date preference.

## 2016-08-31 NOTE — Telephone Encounter (Signed)
Pt responded- FYI

## 2016-08-31 NOTE — Telephone Encounter (Signed)
Scheduled patient with Valentino NoseJennifer Kuehl, ARNP, on 09/07/2016 at 8:00 am.    Responded to eCare.

## 2016-09-01 NOTE — Telephone Encounter (Signed)
Responded to patient's eCare to schedule sooner.

## 2016-09-02 ENCOUNTER — Other Ambulatory Visit (INDEPENDENT_AMBULATORY_CARE_PROVIDER_SITE_OTHER): Payer: Medicare HMO

## 2016-09-02 ENCOUNTER — Ambulatory Visit (INDEPENDENT_AMBULATORY_CARE_PROVIDER_SITE_OTHER): Payer: Medicare HMO | Admitting: Family Practice

## 2016-09-02 VITALS — BP 138/79 | HR 71 | Temp 98.1°F | Resp 16 | Wt 151.5 lb

## 2016-09-02 DIAGNOSIS — R059 Cough, unspecified: Secondary | ICD-10-CM

## 2016-09-02 DIAGNOSIS — R05 Cough: Secondary | ICD-10-CM

## 2016-09-02 DIAGNOSIS — J029 Acute pharyngitis, unspecified: Secondary | ICD-10-CM

## 2016-09-02 DIAGNOSIS — Z6823 Body mass index (BMI) 23.0-23.9, adult: Secondary | ICD-10-CM

## 2016-09-02 DIAGNOSIS — J329 Chronic sinusitis, unspecified: Secondary | ICD-10-CM

## 2016-09-02 MED ORDER — BENZONATATE 200 MG OR CAPS
200.0000 mg | ORAL_CAPSULE | Freq: Three times a day (TID) | ORAL | 0 refills | Status: DC | PRN
Start: 2016-09-02 — End: 2016-09-20

## 2016-09-02 MED ORDER — GUAIFENESIN-CODEINE 100-10 MG/5ML OR SOLN
10.0000 mL | Freq: Every evening | ORAL | 0 refills | Status: DC
Start: 2016-09-02 — End: 2016-09-20

## 2016-09-02 NOTE — Progress Notes (Signed)
Reason for visit: cough with sore throat     Have seen a specialist since your last visit: NO     HM Due:   Health Maintenance   Topic Date Due    Hepatitis C Screening  02/06/1964    HIV Screening  02/05/1978    Colorectal Cancer Screening (FOBT/FIT)  02/05/2013    Influenza Vaccine (1) 10/24/2015    Diabetes Screening  05/10/2018    Lipid Disorders Screening  12/08/2019    Tetanus Vaccine  12/05/2023           No future appointments.

## 2016-09-02 NOTE — Progress Notes (Signed)
Cassie Contreras is a 66 year old female presenting for illness:    Reports cough, sore throat, fever, fatigue which started 08/26/16 while [place]. She saw the cruiseship MD on 08/28/16 and was prescribed Clindamycin 300mg  4 times daily for 7 days. Patient has been taking this medication as prescribed and has two full days remaining of the medication. She is taking the abx with food, denies nausea, vomiting, diarrhea.  Fever as high as 24F. Reports history of bronchitis.  Patient notes about 50% overall improvement to her symptoms over the first three days since starting Clindamycin but she denies improvement since then.  Patient states that she did have strep throat swab done on the cruise ship (rapid and culture were negative) but she did not have a CXR.    Sore throat: YES  Treatment up to this point: Clindamycin 300mg  QID  Sick Contacts: multiple on cruise ship  Recent Travel: yes - [place] cruise  History of Allergies or Asthma: Denies  Smoking: Denies    ROS:  Constitutional:  Reports fever, chills.    Eyes: Denies changes in vision, eye pain, or eye discharge.    Ears:  Denies ear pain, ear discharge  Nose:  Admits to clear rhinorrhea, sinus pain  Throat:  Denies difficulty swallowing.    Cardiovascular: Denies chest pain, palpitations, dyspnea on exertion  Respiratory: Admits to coughing, no wheezing, shortness of breath, tachypnea  Gastrointestinal: Denies abdominal pain, nausea, vomiting, and diarrhea     Patient Active Problem List   Diagnosis    Os peroneum syndrome of left foot    S/P foot & ankle surgery, left    Neuritis of left sural nerve    Recurrent UTI    Osteopenia of spine    Palpitations    Atrophic vaginitis    Hormone replacement therapy       Review of patient's allergies indicates:  Allergies   Allergen Reactions    Erythromycin     Ibuprofen      TX FROM PAPER CHART      Penicillins      TX FROM PAPER CHART      Sulfa Antibiotics      TX FROM PAPER CHART              Outpatient Medications Prior to Visit   Medication Sig Dispense Refill    Hydrocodone-Acetaminophen 5-325 MG Oral Tab Take 1 tablet by mouth every 6 hours as needed for pain. For pain. Not to exceed 8 tablets per day. (Patient not taking: Reported on 09/02/2016) 10 tablet 0    Metoprolol Succinate ER 25 MG Oral TABLET SR 24 HR       PREMPRO 0.3-1.5 MG Oral Tab 1 tab twice a week 36 tablet 3    Tamsulosin HCl (FLOMAX) 0.4 MG Oral Cap Take 1 capsule (0.4 mg) by mouth at bedtime. 90 capsule 3    VAGIFEM 10 MCG Vaginal Tab Place 1 tablet (10 mcg) into the vagina 2 times a week. 24 tablet 3     No facility-administered medications prior to visit.        OBJECTIVE:   BP 138/79    Pulse 71    Temp 98.1 F (36.7 C) (Temporal)    Resp 16    Wt 151 lb 8 oz (68.7 kg)    SpO2 98%    BMI 23.21 kg/m    General appearance: healthy, alert, in mild distress  Eyes: clear,  no significant conjunctival injection.   Ears: Canals normal. R TM - normal, L TM - normal.   Nose: normal   Sinuses: No sinus tenderness   Oropharynx: normal   Neck: normal, supple, nontender to palpation and no adenopathy   Lungs: clear to auscultation   Cardiac Auscultation: Regular rate and rhythym.  S1 and S2 are normal.  No murmurs, gallops or rubs.      A/P:  1. Cough  2. Sinusitis  3. Sore Throat  Xray not available at clinic today but patient is scheduled for chest [facility] at [facility] for imaging today. She is vigorously coughing, reports plateau of symptom improvement since starting Clindamycin so will collect imaging to r/o pneumonia.  - We discussed side effects and benefits of cough suppressants and encouraged her to rest, increase fluid intake. She may take Tylenol 1g twice daily as needed for headache, fever. Continue Clindamycin as prescribed for now and follow-up in 5-7 days after completing the antibiotic if symptoms are worse or not improved.  - Guaifenesin-Codeine (CHERATUSSIN AC) 100-10 MG/5ML Oral Solution; Take 10 mL by mouth at  bedtime.  Dispense: 118 mL; Refill: 0  - Benzonatate 200 MG Oral Cap; Take 1 capsule (200 mg) by mouth 3 times a day as needed.  Dispense: 21 capsule; Refill: 0  - XR CHEST 2 VW; Future    2.  Discussed having pt push fluids, steam, nasal saline irrigation, use of OTC products to alleviate symptoms, ibuprofen prn pain/fevers, rest.  3. Dx, Tx, risks and alternatives discussed with patient and questions answered. Return to clinic if symptoms increase, persist, or worsen.

## 2016-09-04 ENCOUNTER — Telehealth (INDEPENDENT_AMBULATORY_CARE_PROVIDER_SITE_OTHER): Payer: Self-pay | Admitting: Family Practice

## 2016-09-04 NOTE — Telephone Encounter (Signed)
Called and relayed provider message. Patient confirmed understanding. Nothing further needed, closing TE.

## 2016-09-04 NOTE — Telephone Encounter (Signed)
Please call patient and let her know that her chest xray from Saturday is normal. No rib fracture, no evidence of pneumonia. Please continue the plan we discussed at her appointment.    Thank you

## 2016-09-07 ENCOUNTER — Encounter (INDEPENDENT_AMBULATORY_CARE_PROVIDER_SITE_OTHER): Payer: Medicare HMO | Admitting: Family

## 2016-09-09 ENCOUNTER — Other Ambulatory Visit (INDEPENDENT_AMBULATORY_CARE_PROVIDER_SITE_OTHER): Payer: Self-pay | Admitting: Family Practice

## 2016-09-09 DIAGNOSIS — R059 Cough, unspecified: Secondary | ICD-10-CM

## 2016-09-10 NOTE — Telephone Encounter (Signed)
(  TEXTING IS AN OPTION FOR [facility] CLINICS ONLY)  Is this a [facility] clinic? Yes - patient uses [facility]      RETURN CALL: Detailed voicemail ok per patient      SUBJECT:  Refill Request    MEDICATION(S): Cough syrup with codeine  NEEDED BY: Monday   PRESCRIBING PROVIDER: Dr. [name]  PHARMACY NAME AND LOCATION: [facility] (in original ecare message)  PHARMACY PHONE: see ecare msg  PHARMACY FAX NUMBER: see ecare msg  ADDITIONAL INFORMATION: Patient informed of standard 48-72 hour refill timeframe, but she states that she has enough cough medicine to last today, but needs a refill tomorrow. Please call the patient to discuss on Monday 8/20. Thank you.

## 2016-09-11 ENCOUNTER — Emergency Department
Admission: EM | Admit: 2016-09-11 | Discharge: 2016-09-12 | Disposition: A | Discharge status: Home / Self Care | Payer: Medicare HMO | Attending: Emergency Medicine | Admitting: Emergency Medicine

## 2016-09-11 ENCOUNTER — Ambulatory Visit: Payer: Self-pay

## 2016-09-11 ENCOUNTER — Other Ambulatory Visit: Payer: Self-pay | Admitting: Emergency Medicine

## 2016-09-11 DIAGNOSIS — R05 Cough: Secondary | ICD-10-CM | POA: Insufficient documentation

## 2016-09-11 DIAGNOSIS — R509 Fever, unspecified: Secondary | ICD-10-CM | POA: Insufficient documentation

## 2016-09-11 LAB — URINALYSIS WITH REFLEX CULTURE
Bacteria, URN: NONE SEEN
Bilirubin (Qual), URN: NEGATIVE
Epith Cells_Renal/Trans,URN: NEGATIVE /HPF
Glucose Qual, URN: NEGATIVE mg/dL
Leukocyte Esterase, URN: NEGATIVE
Nitrite, URN: NEGATIVE
Occult Blood, URN: NEGATIVE
Protein (Alb Semiquant), URN: NEGATIVE mg/dL
RBC, URN: NEGATIVE /HPF
Specific Gravity, URN: 1.013 g/mL (ref 1.002–1.027)
WBC, URN: NEGATIVE /HPF

## 2016-09-11 LAB — CBC, DIFF
% Basophils: 0 %
% Eosinophils: 1 %
% Immature Granulocytes: 0 %
% Lymphocytes: 11 %
% Monocytes: 9 %
% Neutrophils: 79 %
% Nucleated RBC: 0 %
Absolute Eosinophil Count: 0.1 10*3/uL (ref 0.00–0.50)
Absolute Lymphocyte Count: 1.26 10*3/uL (ref 1.00–4.80)
Basophils: 0.04 10*3/uL (ref 0.00–0.20)
Hematocrit: 34 % — ABNORMAL LOW (ref 36–45)
Hemoglobin: 11.8 g/dL (ref 11.5–15.5)
Immature Granulocytes: 0.05 10*3/uL (ref 0.00–0.05)
MCH: 31.3 pg (ref 27.3–33.6)
MCHC: 34.4 g/dL (ref 32.2–36.5)
MCV: 91 fL (ref 81–98)
Monocytes: 1.05 10*3/uL — ABNORMAL HIGH (ref 0.00–0.80)
Neutrophils: 8.62 10*3/uL — ABNORMAL HIGH (ref 1.80–7.00)
Nucleated RBC: 0 10*3/uL
Platelet Count: 344 10*3/uL (ref 150–400)
RBC: 3.77 10*6/uL — ABNORMAL LOW (ref 3.80–5.00)
RDW-CV: 12.2 % (ref 11.6–14.4)
WBC: 11.12 10*3/uL — ABNORMAL HIGH (ref 4.3–10.0)

## 2016-09-11 LAB — COMPREHENSIVE METABOLIC PANEL
ALT (GPT): 43 U/L — ABNORMAL HIGH (ref 7–33)
AST (GOT): 57 U/L — ABNORMAL HIGH (ref 9–38)
Albumin: 3.9 g/dL (ref 3.5–5.2)
Alkaline Phosphatase (Total): 103 U/L (ref 38–172)
Anion Gap: 11 (ref 4–12)
Bilirubin (Total): 0.4 mg/dL (ref 0.2–1.3)
Calcium: 8.9 mg/dL (ref 8.9–10.2)
Carbon Dioxide, Total: 20 meq/L — ABNORMAL LOW (ref 22–32)
Chloride: 96 meq/L — ABNORMAL LOW (ref 98–108)
Creatinine: 0.71 mg/dL (ref 0.38–1.02)
GFR, Calc, African American: >60 mL/min/{1.73_m2} (ref >59)
GFR, Calc, European American: >60 mL/min/{1.73_m2} (ref >59)
Glucose: 107 mg/dL (ref 62–125)
Potassium: 4.2 meq/L (ref 3.6–5.2)
Protein (Total): 6.9 g/dL (ref 6.0–8.2)
Sodium: 127 meq/L — ABNORMAL LOW (ref 135–145)
Urea Nitrogen: 20 mg/dL (ref 8–21)

## 2016-09-11 LAB — L LACTATE, VENOUS PLASMA (TO U/H LAB > 30 MIN): L Lactate (Indirect), VEN: 0.6 mmol/L (ref 0.5–2.2)

## 2016-09-11 NOTE — Telephone Encounter (Signed)
Reason for Disposition  • Message left on identified answering machine.    Protocols used: NO CONTACT OR DUPLICATE CONTACT CALL-ADULT-AH

## 2016-09-11 NOTE — Telephone Encounter (Signed)
No Contact Made.  Message about fever.

## 2016-09-11 NOTE — Telephone Encounter (Signed)
Regarding: [place] pt- got sick on cruise, pt was on antibiotics, has been off antibiotics for a week but fever has   ----- Message from Virgilio Belling sent at 09/11/2016  6:56 PM PDT -----  [place] pt- got sick on cruise, pt was on antibiotics, has been off antibiotics for a week but fever has gone back up currently 101.4

## 2016-09-12 ENCOUNTER — Encounter (INDEPENDENT_AMBULATORY_CARE_PROVIDER_SITE_OTHER): Payer: Medicare HMO | Admitting: Family Medicine

## 2016-09-12 ENCOUNTER — Telehealth (INDEPENDENT_AMBULATORY_CARE_PROVIDER_SITE_OTHER): Payer: Self-pay | Admitting: Family Medicine

## 2016-09-12 DIAGNOSIS — R059 Cough, unspecified: Secondary | ICD-10-CM

## 2016-09-12 MED ORDER — GUAIFENESIN-CODEINE 100-10 MG/5ML OR SYRP
5.0000 mL | ORAL_SOLUTION | Freq: Four times a day (QID) | ORAL | 1 refills | Status: DC | PRN
Start: 2016-09-12 — End: 2016-09-20

## 2016-09-12 NOTE — Telephone Encounter (Signed)
Updated with Urgent Care provider as home clinic in [place] was closed when TE was routed for request.    Contacted spouse Cassie Contreras and informed him medication has been sent to requested pharmacy as needed.     Rx faxed to [facility] in [place].     No further action is needed. Closing TE

## 2016-09-12 NOTE — Telephone Encounter (Signed)
Routing to PCP. Please advise. This medication was not prescribed at this clinic however pt wants a different dose.

## 2016-09-12 NOTE — Telephone Encounter (Signed)
Chart reviewed. Rx for Guaifenesin-cod 100-10mg /94ml faxed to pts pharmacy.  PSR contacted pt to inform re rx change.    [facility] had rxed  222-7.5mg /28ml which pharmacy does not carry).

## 2016-09-12 NOTE — Telephone Encounter (Signed)
RETURN CALL: Yes     ([facility] ONLY) Texting is an option for this clinic. If you would like [place] to use this option, which mobile phone number should we text to if we are unable to reach you?     SUBJECT: Received a call from Call Center Rep stating they had a spouse on line Jeannett Senior calling for his spouse Kaleshia. He hung up before he could be warm transferred to clinic.    Gentri was seen at ER and were there about 6 hours. She was given RX for Codeine and they have attempted to take to a few pharmacy's but they are requesting either a lower or higher dosage as they do not carry dose they were given. I was not able to get much information from call center rep regarding exact dose. Spouse Jeannett Senior would like for provider to call pharmacy as soon as possible to approve either dosage as she is in need of it. Call Center confirmed he would like it to be sent to [facility] in [facility]. Since this is a Controlled RX he might need to pick up new RX or provider can call in to pharmacy as needed.       Routing High Priority  to PCP Clemmie Krill in [place] to advise!

## 2016-09-13 ENCOUNTER — Encounter (INDEPENDENT_AMBULATORY_CARE_PROVIDER_SITE_OTHER): Payer: Medicare HMO | Admitting: Family Practice

## 2016-09-15 ENCOUNTER — Encounter (INDEPENDENT_AMBULATORY_CARE_PROVIDER_SITE_OTHER): Payer: Self-pay | Admitting: Family Medicine

## 2016-09-17 NOTE — Telephone Encounter (Signed)
Per chart review, patient went to ED and follow-up on cough has been addressed. Closing TE. Declined controlled substance refill at this time.

## 2016-09-18 ENCOUNTER — Telehealth (INDEPENDENT_AMBULATORY_CARE_PROVIDER_SITE_OTHER): Payer: Self-pay | Admitting: Family Medicine

## 2016-09-18 DIAGNOSIS — Z79890 Hormone replacement therapy (postmenopausal): Secondary | ICD-10-CM

## 2016-09-18 DIAGNOSIS — N761 Subacute and chronic vaginitis: Secondary | ICD-10-CM

## 2016-09-18 DIAGNOSIS — R059 Cough, unspecified: Secondary | ICD-10-CM

## 2016-09-18 NOTE — Telephone Encounter (Signed)
**  Complete Incoming Call Section as well**    RETURN CALL: General message OK      ([facility] ONLY) Texting is an option for this clinic. If you would like [place] to use this option, which mobile phone number should we text to if we are unable to reach you? N/A    SUBJECT: General Message     REASON FOR REQUEST: Medication Management    MESSAGE: Pt called about rx (VAGIFEM 10 MCG Vaginal Tab). States she is to take 3 times a week and states pharmacy needs new rx with new sig sent so insurance will cover. Per pt's chart- only taking 2 times a week. Please advise, thank you. Pharmacy loaded if needed.

## 2016-09-18 NOTE — Telephone Encounter (Signed)
Covering Dr [name], chart reviewed.  medlist per Dr [name] did states use twice a week.  Will defer to PCP to change dose if appropriate.

## 2016-09-20 ENCOUNTER — Telehealth (INDEPENDENT_AMBULATORY_CARE_PROVIDER_SITE_OTHER): Payer: Self-pay | Admitting: Family Medicine

## 2016-09-20 DIAGNOSIS — R059 Cough, unspecified: Secondary | ICD-10-CM

## 2016-09-20 MED ORDER — PREMPRO 0.3-1.5 MG OR TABS
ORAL_TABLET | ORAL | 3 refills | Status: DC
Start: 2016-09-20 — End: 2017-04-24

## 2016-09-20 MED ORDER — GUAIFENESIN-CODEINE 100-10 MG/5ML OR SYRP
5.0000 mL | ORAL_SOLUTION | Freq: Four times a day (QID) | ORAL | 0 refills | Status: DC | PRN
Start: 2016-09-20 — End: 2016-10-10

## 2016-09-20 MED ORDER — VAGIFEM 10 MCG VA TABS
10.0000 ug | ORAL_TABLET | VAGINAL | 3 refills | Status: DC
Start: 2016-09-20 — End: 2017-04-09

## 2016-09-20 NOTE — Telephone Encounter (Signed)
Dr. Janine OresKei,     [facility], bronchitis, and ear infection and is having coughing fits.   She would like a refill from you if possible. [facility]odiene cough medicicine  Pharmacy and medication is loaded.

## 2016-09-20 NOTE — Telephone Encounter (Signed)
Pt's RX has been faxed and pt informed.

## 2016-09-20 NOTE — Telephone Encounter (Signed)
LMTC backline to help clarify our discussion re this back in April

## 2016-09-20 NOTE — Telephone Encounter (Signed)
Pt called back tried to transfer but provider in a room. Call pt back at your earliest.  Pt has no time constraints.

## 2016-09-20 NOTE — Telephone Encounter (Signed)
Pt states she spoke with Dr. [name] verbally awhile ago that she  was to take 3 times a week starting in the fall. Did she mis understand? Routing for advisement.

## 2016-09-20 NOTE — Telephone Encounter (Signed)
Yes should be twice a week

## 2016-09-20 NOTE — Telephone Encounter (Signed)
S/w patient, clarified prempro taper along w incr of vagifem to three times weekly so rx re-written.  Also would like refill of cough syrup, cough is better but not resolved.  Reviewed s/s of worsening. f/u prn no improvement.

## 2016-10-07 ENCOUNTER — Telehealth (INDEPENDENT_AMBULATORY_CARE_PROVIDER_SITE_OTHER): Payer: Self-pay | Admitting: Family Medicine

## 2016-10-07 NOTE — Telephone Encounter (Signed)
Spoke with patient; scheduled for 10/18/16 at 2:30pm.

## 2016-10-07 NOTE — Telephone Encounter (Signed)
(  TEXTING IS AN OPTION FOR [facility] CLINICS ONLY)  Is this a [facility] clinic? Yes. What is the mobile number we can use to get a hold of you via text? [phone]    RETURN CALL: Patient can be reached at  [phone]. If unavailable, please leave a detailed voice message. Text message okay as well.     SUBJECT:  General Message     REASON FOR REQUEST: Request for sooner appointment     MESSAGE: Patient is requesting to see [name], MD sooner than what [facility] currently offered which is currently 10/26/16. Patient declined to schedule appointment, declined the wait list and declined to see another provider other than Dr. [name] as she states Dr. [name] has been the provider since her symptoms of upper respiratory / bronchitis started. Patient states she is currently not available on any Thursday between 1:00pm - 2:30pm but any other day with no preference on time patient is available. Please call patient back with the return call information listed above. Thank you!      ADDITIONAL INFORMATION: [facility] attempted to transfer call to clinic at #[phone] but Care Team was currently not available to take the call. Thank you!

## 2016-10-10 ENCOUNTER — Telehealth (INDEPENDENT_AMBULATORY_CARE_PROVIDER_SITE_OTHER): Payer: Self-pay | Admitting: Family Medicine

## 2016-10-10 DIAGNOSIS — R059 Cough, unspecified: Secondary | ICD-10-CM

## 2016-10-10 NOTE — Telephone Encounter (Signed)
**  Complete Incoming Call Section as well**    RETURN CALL: Detailed message on voicemail only      ([facility] ONLY) Texting is an option for this clinic. If you would like [place] to use this option, which mobile phone number should we text to if we are unable to reach you? n/a    SUBJECT: General Message     REASON FOR REQUEST: Refill Request    MESSAGE: Patient requesting short refill of codeine cough medicine until her appointment on 9/26. Med and pharmacy pended. Routing to provider for review.

## 2016-10-11 MED ORDER — GUAIFENESIN-CODEINE 100-10 MG/5ML OR SYRP
5.0000 mL | ORAL_SOLUTION | Freq: Four times a day (QID) | ORAL | 0 refills | Status: DC | PRN
Start: 2016-10-11 — End: 2016-10-20

## 2016-10-11 NOTE — Telephone Encounter (Signed)
Pt informed that her RX has been faxed to her pharmacy and confirmation received. Closing TE.

## 2016-10-18 ENCOUNTER — Encounter (INDEPENDENT_AMBULATORY_CARE_PROVIDER_SITE_OTHER): Payer: Medicare HMO | Admitting: Family Medicine

## 2016-10-20 ENCOUNTER — Encounter (INDEPENDENT_AMBULATORY_CARE_PROVIDER_SITE_OTHER): Payer: Self-pay | Admitting: Family Medicine

## 2016-10-20 ENCOUNTER — Telehealth (INDEPENDENT_AMBULATORY_CARE_PROVIDER_SITE_OTHER): Payer: Self-pay | Admitting: Family Medicine

## 2016-10-20 ENCOUNTER — Encounter (INDEPENDENT_AMBULATORY_CARE_PROVIDER_SITE_OTHER): Payer: Medicare HMO | Admitting: Family Medicine

## 2016-10-20 ENCOUNTER — Ambulatory Visit (INDEPENDENT_AMBULATORY_CARE_PROVIDER_SITE_OTHER): Payer: Medicare HMO | Admitting: Family Medicine

## 2016-10-20 VITALS — BP 131/74 | HR 54 | Temp 97.7°F | Resp 15 | Ht 68.0 in | Wt 152.4 lb

## 2016-10-20 DIAGNOSIS — R059 Cough, unspecified: Secondary | ICD-10-CM

## 2016-10-20 DIAGNOSIS — Z6823 Body mass index (BMI) 23.0-23.9, adult: Secondary | ICD-10-CM

## 2016-10-20 DIAGNOSIS — Z1239 Encounter for other screening for malignant neoplasm of breast: Secondary | ICD-10-CM

## 2016-10-20 DIAGNOSIS — R05 Cough: Secondary | ICD-10-CM

## 2016-10-20 DIAGNOSIS — Z0000 Encounter for general adult medical examination without abnormal findings: Secondary | ICD-10-CM

## 2016-10-20 DIAGNOSIS — Z880 Allergy status to penicillin: Secondary | ICD-10-CM

## 2016-10-20 DIAGNOSIS — Z1231 Encounter for screening mammogram for malignant neoplasm of breast: Secondary | ICD-10-CM

## 2016-10-20 MED ORDER — BUDESONIDE 180 MCG/ACT IN AEPB
1.0000 | INHALATION_SPRAY | Freq: Two times a day (BID) | RESPIRATORY_TRACT | 1 refills | Status: DC
Start: 2016-10-20 — End: 2017-02-09

## 2016-10-20 MED ORDER — GUAIFENESIN-CODEINE 100-10 MG/5ML OR SYRP
5.0000 mL | ORAL_SOLUTION | ORAL | 0 refills | Status: DC | PRN
Start: 2016-10-20 — End: 2016-11-04

## 2016-10-20 NOTE — Progress Notes (Signed)
SUBJECTIVE:  Cough for 7 weeks.    Patient presents with URI. Symptoms include   Cough and was on cruise to [place].  She saw the ships doctor and diagnosed right ear infection and put on 7 days of clindamycin.  She saw Cassie Contreras, ARNP 09/02/16 and given cough medication.  Then [facility] developed fever [facility]  Chest x-ray negative but wbc 11K and mild elevation of transaminases.    She was sent home with cough medication.   She notes nasal clogging and had been green/yellow and now white.  She is coughing with post nasal drip and feels thick.  Caught this on the ship.  Used flonase initially.  I asked if she has coughing fits/paroxysmal and she said yes can cough 20-30 times in a row.    History of allergies. Today sneezed so wondered if allergies.    Nonsmoker    Patient Active Problem List   Diagnosis    Os peroneum syndrome of left foot    S/P foot & ankle surgery, left    Neuritis of left sural nerve    Recurrent UTI    Osteopenia of spine    Palpitations    Atrophic vaginitis    Hormone replacement therapy           OBJECTIVE:  BP 131/74    Pulse 54    Temp 97.7 F (36.5 C) (Temporal)    Resp 15    Ht  (1.727 m)    Wt 152 lb 6.4 oz (69.1 kg)    SpO2 99%    BMI 23.17 kg/m    General appearance: healthy,alert / coherent  Ears: R TM - normal, L TM - normal  Nose: normal  Oropharynx: normal  Neck: Supple, nontender, no adenopathy  Lungs: Bilateral breath sounds clear and equal,No wheezes, rhonchi, or rales  Heart: Regular rate and rhythm,no murmurs, rubs, or gallops    ASSESSMENT:  Cassie Contreras was seen today for cough.    Diagnoses and all orders for this visit:    Cough likely post viral   Rule out pertussis  -     REFERRAL TO ALLERGY/IMMUNOLOGY for cough and also she was told she has a penicillin allergy but would like to confirm  -     Add Budesonide 180 MCG/ACT Inhalation AEROSOL POWDER, BREATH ACTIVATED; Inhale 1 puff by mouth every 12 hours. Rinse  mouth after use  -     Refill Guaifenesin-Codeine 100-10 MG/5ML Oral Syrup; Take 5 mL by mouth every 4 hours as needed for cough.  -     PCR FOR PERTUSSIS taken nasopharyngeal today with PPE precautions    Penicillin allergy  -     REFERRAL TO ALLERGY/IMMUNOLOGY    Follow up 2 weeks for recheck and flu shot.  ---------------------------------------------------------------------------------------------------------------------------------------------------------    MEDICARE ANNUAL WELLNESS VISIT  -  Pt due for exam, ok to have < 365 days as [employer] and agrees to complete it while here.    Cassie Contreras is a 66 year old female who presents for a first Annual Wellness Visit.    INFORMATION GATHERING:  I have reviewed and updated as necessary the patient's past medical history and social history in Epic.     The information checked in the list below is up to date at the end of this visit:     Current medications and supplements (including vitamins and calcium)    Allergies    Health Risk Assessment form completed by or with the patient for this visit, including Current Providers; Functional ability, safety, fall risk, & hearing loss; diet, physical activity, and health habits; Depression screening; Patient report of cognitive function via self-report & report of any concerns raised by others    The patient's Health Risk Assessment form for today's visit is:    transcribed into this encounter    Race and ethnicity (from Demographics): White Not Hispanic or Latino    Current providers and suppliers:   - See Care Team Section   - See also EHR Encounters for [facility] Providers involved in care   - Other providers and suppliers outside [facility]: none   Patient Care Team:  Cassie Contreras, Cassie Chamber, MD as PCP - General (Family Practice)        PHQ-2: 0  PHQ-9 (if PHQ-2 score >0):      EXAM:  BP 131/74    Pulse 54    Temp 97.7 F (36.5 C) (Temporal)    Resp 15    Ht  (1.727 m)    Wt 152 lb  6.4 oz (69.1 kg)    SpO2 99%    BMI 23.17 kg/m   Body mass index is 23.17 kg/m.   GENERAL: HCFAGENERAL: healthy, alert, no distress  GAIT: normal      COGNITION: normal , assessed by direct observation  Rest of exam as above  Breasts are symmetric.  No dominant, discrete, fixed  or suspicious masses are noted.   No skin or nipple changes or axillary nodes. Mammogram - is due and ordered today.      ASSESSMENT:  Cassie Contreras was seen for an [employer] Visit, including identification of risk factors & conditions that may affect her health and function in the future.    COUNSELING:    Based on todays evaluation, I recommend the following ways to improve your health or functioning: rest for now    You have the following risk factors and/or medical conditions for which there are recommended ways (included in the list) to help you stay as healthy as possible:  1. Eat healthy, exercise    Health maintenance     Screening for breast cancer  -     MAM MAMMOGRAPHY SCREEN W/TOMO BILATERALLY ordered    Routine general medical examination at a health care facility- return for flu shot when improved      Here are measures that our medical record shows may be recommended for you for screening & prevention, and when each is recommended:   Health Maintenance   Topic Date Due    Zoster Vaccine (2 of 3) 12/28/2010    Influenza Vaccine (1) 10/23/2016    Medicare Annual Wellness Visit  11/09/2016    Pneumococcal Vaccine (2 of 2 - PPSV23) 11/06/2016    Depression Screening (PHQ-2)  05/24/2017    Breast Cancer Screening  11/21/2017    Lipid Disorders Screening  11/09/2020    Tetanus Vaccine  03/24/2022    Colorectal Cancer Screening (Colonoscopy)  01/23/2025    Osteoporosis Screening  Completed    Hepatitis C Screening  Completed     Please plan to have a Subsequent Annual Wellness Visit next year.    Cassie Mccallum, MD

## 2016-10-20 NOTE — Patient Instructions (Addendum)
1.  Add flonase 1  Spray/nostril twice a day    2.  Add pulmicort  1 puff by mouth twice a day and rinse mouth after use.        Patient Education     Discharge Instructions: Using an Inhaler  Your healthcare provider prescribed medicine that you will breathe in (inhale) by using an inhaler or metered-dose inhaler (MDI). An inhaler is a pressurized sprayer that gives you a measured amount of medicine. A spacer (holding chamber) may also be used. Spacers make it easier to inhale medicine correctly. Using the inhaler the right way is important so that the medicine gets to your lungs to help you breathe better. MDIs are a common type of inhaler, and these instructions refer to this type of inhaler. Other type of inhalers are also available. These include dry powder inhalers (DPIs). Check with your healthcare provider if you aren't sure which type of inhaler you are to use when you get home.  Home care  MDI without spacer   Remove the cap andshake the inhaler.   Take a deep breath and breathe out (exhale) all the way.   Place the inhaler in your mouth. Close your lips around it.   As you breathe in deeply, press down on the inhaler to release the medicine. Hold your breath for a count of 10, or as long as you can comfortably. Then slowly breathe out.        MDI with spacer   Remove the caps from the inhaler and spacer and shake the inhaler.   Take a deep breath and breathe out (exhale) all the way. Put the spacer between your teeth and close your lips tightly around the spacer.   Spray 1 puff into the spacer by pressing down on the inhaler. Then slowly breathe in as deeply as you can. If you breathe in too quickly, you may hear a whistling sound in the spacer.   Take the spacer out of your mouth. Hold your breath for a count of 10, or as long as you can comfortably. Then slowly breathe out.    Follow-up with your provider  As soon as you can, make all of yourfollow-up appointments as directed by our  staff.  When to call 911  Call 911 right away if you have:   Shortness of breath that does not get better after taking your quick-relief medicine   Trouble walking and talking because of shortness of breath   Blue lips or fingernails   Severe wheezing   Peak flow readings less than 50% of your personal best   Date Last Reviewed: 10/24/2014   2000-2017 [employer]. [address], [place], [place] [address]. All rights reserved. This information is not intended as a substitute for professional medical care. Always follow your healthcare professional's instructions.

## 2016-10-20 NOTE — Telephone Encounter (Signed)
Patient needing two week follow up appointment. Patient could not make appointment times with PCP on 11/02/2016 and asked to schedule with Dr. Leanora Ivanoff. Scheduled 11/03/2016 at 10:20 am.    Routing to Dr. Leanora Ivanoff, 10:20 am is a hold slot. May we use slot?    PSR: please confirm with patient if Dr. Leanora Ivanoff approves hold time.

## 2016-10-20 NOTE — Progress Notes (Signed)
Reason for visit: Cough follow up   Mammo: UTD  Colon Screen: UTD  Have seen a specialist since your last visit: NO     HM Due:   Health Maintenance   Topic Date Due    Zoster Vaccine (2 of 3) 12/28/2010    Medicare Annual Wellness Visit  11/09/2016    Influenza Vaccine (1) 10/23/2016    Pneumococcal Vaccine (2 of 2 - PPSV23) 11/06/2016    Depression Screening (PHQ-2)  05/24/2017    Breast Cancer Screening  11/21/2017    Lipid Disorders Screening  11/09/2020    Tetanus Vaccine  03/24/2022    Colorectal Cancer Screening (Colonoscopy)  01/23/2025    Osteoporosis Screening  Completed    Hepatitis C Screening  Completed           No future appointments.      ANNUAL WELLNESS VISIT     Cassie Contreras presents for a subsequent Annual Wellness Visit.    Rooming activities:  Height and weight measured: YES  PHQ-2 responses entered into screening section: YES  PHQ-9 given if PHQ-2 score is >0: YES         HEALTH RISK ASSESSMENT:     Current providers and suppliers  _ See Care Team Section  _ See also EHR Encounters for [facility] Providers involved in care  _ Other providers and suppliers outside [facility]:      SELF ASSESSMENT OF HEALTH:  How do you rate your overall health in the past 4 weeks?   excellent  Can you manage your health problems?  YES  Due to any health problems, do you need the help of another person with your personal care needs such as eating, bathing,  dressing or getting around the house?  NO      DEPRESSION SCREEN and PSYCHOSOCIAL HEALTH:    PHQ-2 Total Score:         Have your feelings caused you distress or interfered with your ability to get along socially with family or friends? not at all   In the past 2 weeks, have you felt stress over health, finances, relationships or work?  not at all  Do you often get the emotional support you need? nearly every day  In the past 2 weeks, how much body pain do you have? not at all  In the past 2 weeks, how much fatigue do you have? several days         HEALTH AND HABITS:  How much alcohol do you drink weekly?  less than 1 (don't drink every week)  Is your diet well-balanced/healthy, including protein, fiber, vegetables, and fruits? YES   Do you exercise regularly? YES         Type of exercise aerobics and weightlifting         Frequency of exercise 3-5 times  Do you always use your seat belt in the car? YES  How would you describe the condition of your mouth and teeth ? including false teeth or dentures?  excellent  Are you sexually active? YES  Do you find yourself having trouble hearing people speak?  No  Do you wear a hearing aid/device? No  Do you have a fire extinguisher in your home?  YES  Do you have a smoke detector?  YES        ACTIVITIES OF DAILY LIVING:  In your present state of health how much difficulty do you have with the following activities:  Preparing food and eating: 0 No impairment (the person has no problem)       Bathing yourself: 0 No impairment (the person has no problem)       Getting dressed: 0 No impairment (the person has no problem)       Using the toilet: 0 No impairment (the person has no problem)       Moving around from place to place: 0 No impairment (the person has no problem)    In the past year have you fallen or had a near fall? No  Do you feel safe in your home environment? YES     INSTRUMENTAL ACTIVITIES OF DAILY LIVING:  In your present state of health how much difficulty do you have with the following activities?       Shopping: 0 No impairment (the person has no problem)       Using the telephone: 0 No impairment (the person has no problem)       Housekeeping: 0 No impairment (the person has no problem)       Laundry: 0 No impairment (the person has no problem)       Driving or using transportation (bus, taxi): 0 No impairment (the person has no problem)       Handling your own finances: 0 No impairment (the person has no problem)       Managing your own medications: 0 No impairment (the person has no problem)      SIGNS OF COGNITIVE IMPAIRMENT:   Patient report? No  Concerns raised by family members, friends, caretakers or others? No     CARDIAC RISK FACTORS:  Smoker: No  Obesity: No  Diabetic: No  Known heart disease: No  Family history of heart disease: No  Sedentary lifestyle: No  Hyperlipidemia: No     Patients questionnaire responses copied into template above by Lynnda Child

## 2016-10-20 NOTE — Telephone Encounter (Signed)
Yes, please schedule.

## 2016-10-23 ENCOUNTER — Encounter (INDEPENDENT_AMBULATORY_CARE_PROVIDER_SITE_OTHER): Payer: Self-pay | Admitting: Family Medicine

## 2016-10-23 DIAGNOSIS — J018 Other acute sinusitis: Secondary | ICD-10-CM

## 2016-10-23 LAB — PCR FOR PERTUSSIS: PCR FOR PERTUSSIS: NEGATIVE

## 2016-10-23 MED ORDER — DOXYCYCLINE MONOHYDRATE 100 MG OR CAPS
100.0000 mg | ORAL_CAPSULE | Freq: Two times a day (BID) | ORAL | 0 refills | Status: DC
Start: 2016-10-23 — End: 2016-10-24

## 2016-10-23 NOTE — Telephone Encounter (Signed)
Routing to provider, please see ecare message.

## 2016-10-23 NOTE — Telephone Encounter (Signed)
replied

## 2016-10-24 MED ORDER — DOXYCYCLINE MONOHYDRATE 100 MG OR CAPS
100.0000 mg | ORAL_CAPSULE | Freq: Two times a day (BID) | ORAL | 0 refills | Status: DC
Start: 2016-10-24 — End: 2016-11-04

## 2016-10-24 NOTE — Addendum Note (Signed)
Addended by: Liane Comber MEI on: 10/24/2016 11:48 AM     Modules accepted: Orders

## 2016-11-03 ENCOUNTER — Ambulatory Visit (INDEPENDENT_AMBULATORY_CARE_PROVIDER_SITE_OTHER): Payer: Medicare HMO | Admitting: Family Medicine

## 2016-11-03 VITALS — BP 115/70 | HR 50 | Temp 97.2°F | Resp 12 | Wt 151.0 lb

## 2016-11-03 DIAGNOSIS — Z23 Encounter for immunization: Secondary | ICD-10-CM

## 2016-11-03 DIAGNOSIS — E063 Autoimmune thyroiditis: Secondary | ICD-10-CM

## 2016-11-03 DIAGNOSIS — R05 Cough: Secondary | ICD-10-CM

## 2016-11-03 DIAGNOSIS — J383 Other diseases of vocal cords: Secondary | ICD-10-CM

## 2016-11-03 DIAGNOSIS — R059 Cough, unspecified: Secondary | ICD-10-CM

## 2016-11-03 NOTE — Progress Notes (Signed)
Reason for visit:  Follow up on respiratory        Cervical screening/PAP:  Mammo:   Colon Screen:   Have seen a specialist since your last visit: NO     HM Due:   Health Maintenance   Topic Date Due   • Zoster Vaccine (2 of 3) 12/28/2010   • Influenza Vaccine (1) 10/23/2016   • Pneumococcal Vaccine (2 of 2 - PPSV23) 11/06/2016   • Medicare Annual Wellness Visit  10/20/2017   • Depression Screening (PHQ-2)  10/22/2017   • Breast Cancer Screening  11/21/2017   • Lipid Disorders Screening  11/09/2020   • Tetanus Vaccine  03/24/2022   • Colorectal Cancer Screening (Colonoscopy)  01/23/2025   • Osteoporosis Screening  Completed   • Hepatitis C Screening  Completed           Future Appointments  Date Time Provider Department Center   11/03/2016 10:20 AM Yung, Pamela Mei, MD [facility]   11/29/2016 1:40 PM [facility]   12/19/2016 12:30 PM Ayars, Andrew Garrison, MD [facility]     Vaccine Screening Questions    Interpreter: No    1. Are you allergic to Latex? NO    2.  Have you had a serious reaction or an allergic reaction to a vaccine?  NO    3.  Currently have a moderate or severe illness, including fever?  NO    4.  Ever had a seizure or any neurological problem associated with a vaccine? (DTaP/TDaP/DTP pertinent) NO    5.  Is patient receiving any live vaccinations today? (Varicella-Chickenpox, MMR-Measles/Mumps/Rubella, Zoster-Shingles, Flumist, Yellow Fever) NOTE: oral rotavirus is exempt  NO    If YES to any of the questions above - Do NOT give vaccine.  Consult with RN or provider in clinic.  (#5 can be YES if all Live vaccine questions are answered NO)    If NO to all questions above - Patient may receive vaccine.    6.  Do you need to receive the Flu vaccine today? YES - Additional Flu Questions  Flu Vaccine Screening Questions:    Ever had a serious allergic reaction to eggs?  NO    Ever had Guillain-Barre syndrome associated with a vaccine? NO    Less than 6 months old? NO    If YES to any of the Flu  questions above - NO Flu Vaccine to be given.  Patient may consult provider as needed.    If NO to all questions above - Patient may receive Flu Shot (IM)    If between 6 months and 8 years of age, was flu vaccine received last year?  YES  If NO to above question:  • Children who are receiving influenza vaccine for the first time - administer 2 doses of the current influenza vaccine (separated by at least 4 weeks).          All patients are encouraged to wait 15 minutes before leaving after receiving any vaccine.    VIS given 11/03/2016 by  M , MA.

## 2016-11-03 NOTE — Patient Instructions (Addendum)
Wean budesonide inhaler to one puff in evenings for a week then stop if you feel ok.    Check your tsh lab in 5 months.        It was a pleasure to see you in clinic today.                If you are not yet signed up for eCare, please see the below eCare section at the end of this document for how to enroll and to get your access codes.  It's easy to sign up at home today.    eCare  enrollment will allow you access to the below benefits   You can make appointments online   View test results / Lab Results   Request prescription renewals   Obtain a copy of our After Visit Summary (an electronic copy of this document)     Your Test Results:  If labs were ordered today the results are expected to be available via eCare in about 5 days. If you have an active eCare account, this is how we will notify you of your results.     If you do not have an eCare account then your test results will be mailed to you within about 14 days after your tests are completed. If your physician needs to change your care based on your results or is concerned, you should expect a phone call or eCare message from your provider.    If you have any questions about your test results please schedule an appointment with your provider, so that we may review them with you in greater detail.    **If it has been more than 2 weeks and you have not received your test results please send our office a message via eCare.    Medication Refills: If you need a prescription refilled, please contact your pharmacy 1 week before your current supply will run out to request the refill.  Contacting your pharmacy is the fastest and safest way to obtain a medication refill.  The pharmacy will notify our office.  Please note, that a minimum of 48 to 72 hours is needed to refill a medication,  Please call your pharmacy early to allow enough time to refill before you anticipate running out.  For faster medication refills, you can also schedule an appointment with your  provider.    We know you have a choice in where you receive your healthcare and we sincerely thank you for trusting [facility] with your health.  Patient Education     Asthma (Adult)  Asthma is a disease where the medium and small air passages within the lung go into spasm and restrict the flow of air. Inflammation and swelling of the airways cause further blockage. During an acute asthma attack, these factors cause trouble breathing, wheezing, cough and chest tightness.    An asthma attack can be triggered by many things. Common triggers include infections such as the common cold, bronchitis, and pneumonia. Irritants such as smoke or pollutants in the air, very cold air, smoke, perfumes, emotional upset, and exercise can also trigger an attack. Inmany adults with asthma, allergies todust, mold, pollen and animal dander can cause an asthma attack. Skipping doses of daily asthma medicine can also bring on an asthma attack.  Asthma can be controlled using theproper medicines prescribed by your healthcare provider and avoiding exposure to known triggers including allergens and irritants.  Home care   Take prescribed medicine exactly at  the times advised. If you need medicine such as from a hand held inhaler or aerosol breathing machine more than every 4 hours, contact your healthcare provider or seek immediate medical attention. If prescribed an antibiotic or prednisone, take all of the medicine as prescribed, even if you are feeling better after a few days.   Don't smoke. Avoid being exposed to the smoke of others.   Some people with asthma have worsening of their symptoms when they take aspirin and non-steroidal or fever-reducing medicines like ibuprofen and naproxen. Talk to your healthcare provider if you think this may apply to you.  Follow-up care  Follow up with your healthcare provider, or as advised. Always bring all of your current medicines to any appointments with your  healthcare provider. Also bring a complete list of medicines eventhose not taken for asthma. If you don't already have one, talk to your healthcare provider about developing your own "Asthma Action Plan."  A pneumococcal (pneumonia)vaccine and yearly flu shot (every fall) are recommended. Ask your doctor about this.  When to seek medical advice  Call your healthcare provider right away if any of these occur:   Increased wheezing or shortness of breath   Need to use your inhalers more often than usual without relief   Fever of 100.68F (38C) or higher, or as directed by your healthcare provider   Coughing up lots of dark-colored or bloody sputum (mucus)   Chest pain with each breath   If you use a peak flow meter as part of an Asthma Action Plan, and you are still in the yellow zone (50% to 80%) 15 minutes after using inhaler medicine.  Call 911  Call 911 if any of the following occur   Trouble walking or talking because of shortness of breath   If you use a peak flow meter as part of an Asthma Action Plan andyou are still in the red zone (less than 50%) 15 minutes after using inhaler medicine   Lips or fingernails turning gray or blue  Date Last Reviewed: 05/24/2015   2000-2017 [employer], [place]. [address], [place], [place] [address]. All rights reserved. This information is not intended as a substitute for professional medical care. Always follow your healthcare professional's instructions.

## 2016-11-04 NOTE — Progress Notes (Signed)
SUBJECTIVE:  Cassie Contreras is a 66 year old female here to follow up cough after her trip to the [place].  See 10/20/16 visit for details.    Chest x-ray negative 10/20/16 and pertussis pcr negative.  Likely bronchospasm and I ordered budesonide and doxycycline.  Cough is better and she notes she loses her voice in the cold air at times.  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  There have been times where she lost her voice and someone else had to take over during a meeting.  She has done speech therapy before.  She would like a flu shot.    Social History   Substance Use Topics    Smoking status: Never Smoker    Smokeless tobacco: Never Used    Alcohol use 0.6 oz/week     1 Glasses of wine per week      Review of Systems  No fever      OBJECTIVE:  BP 115/70    Pulse 50    Temp 97.2 F (36.2 C)    Resp 12    Wt 151 lb (68.5 kg)    SpO2 100%    BMI 22.96 kg/m     General appearance: healthy,alert / coherent  Ears: R TM - normal, L TM - normal  Nose: normal  Oropharynx: normal  Neck: Supple, nontender, no adenopathy  Lungs: Bilateral breath sounds clear and equal,No wheezes, rhonchi, or rales  Heart: Regular rate and rhythm,no murmurs, rubs, or gallops    ASSESSMENT:  Cassie Contreras was seen today for other.    Diagnoses and all orders for this visit:    Cough   She would like to wean budesonide as symptoms are improved.  Ok to reduce to 1 puff daily and if ok, stop after a week.   Use as needed if symptoms return.  See allergist.    Hashimoto's thyroiditis- reviewed notes of Dr. Jacqualine Mau.  Tsh has been normal and suggested to test 1-2 times yearly.  Next tsh ordered - recheck in December or January ( was normal June 2018)  -     THYROID STIMULATING HORMONE; Future    Vocal cord dysfunction- mostly likely but I would like to have her vocal cords evaluated   Discussed certain factors could be a trigger ( cold) or may need to go back for speech therapy but will have her see ENT  -     REFERRAL TO OTO-HEAD NECK SURGERY      Need  for prophylactic vaccination and inoculation against influenza  -     influenza high dose vaccine trivalent PF (adult greater than 41yrs) 0.5 mL IM - 16109

## 2016-11-20 ENCOUNTER — Encounter (INDEPENDENT_AMBULATORY_CARE_PROVIDER_SITE_OTHER): Payer: Self-pay | Admitting: Allergy/Immunology

## 2016-11-29 ENCOUNTER — Ambulatory Visit: Payer: Medicare HMO | Attending: Family Medicine

## 2016-11-29 DIAGNOSIS — Z1231 Encounter for screening mammogram for malignant neoplasm of breast: Secondary | ICD-10-CM | POA: Insufficient documentation

## 2016-12-18 NOTE — Progress Notes (Deleted)
Referring provider: Dr. Leanora IvanoffYung    CC: We were kindly asked to evaluate Cassie SinkRita regarding penicillin allergy.    HPI: Patient is a very pleasant     Outpatient Medications Prior to Visit   Medication Sig Dispense Refill    Budesonide 180 MCG/ACT Inhalation AEROSOL POWDER, BREATH ACTIVATED Inhale 1 puff by mouth every 12 hours. Rinse mouth after use 1 Inhaler 1    Metoprolol Succinate ER 25 MG Oral TABLET SR 24 HR       PREMPRO 0.3-1.5 MG Oral Tab 1 tab once a week 36 tablet 3    Tamsulosin HCl (FLOMAX) 0.4 MG Oral Cap Take 1 capsule (0.4 mg) by mouth at bedtime. 90 capsule 3    VAGIFEM 10 MCG Vaginal Tab Place 1 tablet (10 mcg) into the vagina 3 times a week. 36 tablet 3     No facility-administered medications prior to visit.        Past Medical History:   Diagnosis Date    Heart palpitations     Metatarsal stress fracture of right foot     TX FROM PAPER CHART      Squamous cell carcinoma in situ of skin        Social History     Social History    Marital status: Married     Spouse name: N/A    Number of children: N/A    Years of education: N/A     Occupational History    Not on file.     Social History Main Topics    Smoking status: Never Smoker    Smokeless tobacco: Never Used    Alcohol use 0.6 oz/week     1 Glasses of wine per week    Drug use: No    Sexual activity: Yes     Partners: Male     Other Topics Concern    Not on file     Social History Narrative    No narrative on file           Family history: ***    Review of patient's allergies indicates:  Allergies   Allergen Reactions    Erythromycin Other     Strange smells but no rash or trouble breathing.    Ibuprofen      TX FROM PAPER CHART      Penicillins      TX FROM PAPER CHART      Sulfa Antibiotics      TX FROM PAPER CHART         A complete ROS was performed and negative other than per HPI and as follows- ***.    Physical Exam:  There were no vitals taken for this visit.  Gen: Alert and Oriented NAD***   HEENT: PERRL, nasal mucosa  non-erythematous, non-boggy***   CV: RRR, normal S1 and S2***  Resp: CTA, no wheezes or crackles***  Ext: No c/c/e***  Skin: No rashes on upper extremities or face***  Psych: Normal mood and affect***    PrePen Skin Testing    Testing was performed to evaluate for poissible IgE mediated hypersensitivity to PrePen and penicillin G.    Skin Testing  Interpretation of Skin Test Results:  0: No reaction  +: Erythema ?  ++: Erythema ? mm with wheal < 3 mm  +++: Wheal 3-6 mm  ++++: Wheal > 6 mm or pseudopod     Histamine (1mg /ml):  ++++  Saline:    0    PrePen:  0  Pen G:    0    Intradermal Testing (duplicates with Prepen and Penicillin G)  Interpretation of Intradermal Test Results:  0: 2 mm or less  +: 3-4 mm wheal with erythema  ++: 4-8 mm wheal with erythema  +++: >8 mm wheal with erythema  ++++: Wheal with pseudopods    Histamine (0.1mg /ml):  ++++  Saline:    0    PrePen:   0  0  Pen G:    0    0    Interpretation:     Oral Challenge Procedure: Amoxicillin    He was given amoxicillin 250mg /575ml in incremental doses:    Time:  Dose:  ***:   Amoxicillin 0.5 ml (25 mg) at and monitored for 15 minutes without a reaction.    ***:   Amoxicillin 4.5 ml (225 mg) and monitored for over 1 hour.    In total he was monitored for *** minutes after his initial dose and did not develop a reaction.    Monitored for one hour after the final dose, did not develop any wheezing, chest tightness, shortness of breath or diffuse rash.  Asymptomatic at discharge.    Impression of oral challenge- No evidence for IgE mediated hypersensitivity.           IMPRESSION/RECOMMENDATIONS    1. Adverse drug reactions- Penicillin- No reaction on Prepen or penicillin G testing today and tolerated amoxicillin challenge.  This rules out an IgE mediated hypersensitivity reaction.  The patient does not have a history of a severe, delayed rash.  Discussed that it is possible to develop a delayed reaction but these are often not reproducible and often do not  preclude future use.  If *** does develop a rash to send me a picture to further clarify the reaction.   Recommendations:   -- Will take penicillin off *** allergy list  -- Instructed patient to contact me if *** develops a delayed rash.         Thank you for the consultation on this very pleasant patient, please to not hesitate to contact me with questions- Pager: [phone], Email: [email or web address].    This note was constructed using Print production plannervoice recognition technology.

## 2016-12-19 ENCOUNTER — Encounter (INDEPENDENT_AMBULATORY_CARE_PROVIDER_SITE_OTHER): Payer: Medicare HMO | Admitting: Allergy/Immunology

## 2016-12-25 NOTE — Progress Notes (Signed)
Referring provider: Dr. Leanora IvanoffYung    CC: We were kindly asked to evaluate Cassie Contreras regarding history of penicillin allergy.    HPI: Patient is a very pleasant 66 year old female presenting for initial evaluation of penicillin allergy.  Her reaction occurred when she was a child.  She was told by her parents that she had a penicillin allergy but she does not know any details regarding the reaction.  She has talked to her sister, who also has a penicillin allergy, and she does not know any details either.  She was recently on a cruise and developed a severe upper respiratory infection requiring antibiotics.  She feels like it would've been nice to be able to take a penicillin medication at this time.  She is here today because she wants to know whether she is truly allergic.  For several months after this episode she had a persistent cough.  Her cough has subsequently resolved.     Outpatient Medications Prior to Visit   Medication Sig Dispense Refill    Budesonide 180 MCG/ACT Inhalation AEROSOL POWDER, BREATH ACTIVATED Inhale 1 puff by mouth every 12 hours. Rinse mouth after use 1 Inhaler 1    Metoprolol Succinate ER 25 MG Oral TABLET SR 24 HR       PREMPRO 0.3-1.5 MG Oral Tab 1 tab once a week 36 tablet 3    Tamsulosin HCl (FLOMAX) 0.4 MG Oral Cap Take 1 capsule (0.4 mg) by mouth at bedtime. 90 capsule 3    VAGIFEM 10 MCG Vaginal Tab Place 1 tablet (10 mcg) into the vagina 3 times a week. 36 tablet 3     No facility-administered medications prior to visit.        Past Medical History:   Diagnosis Date    Heart palpitations     Metatarsal stress fracture of right foot     TX FROM PAPER CHART      Squamous cell carcinoma in situ of skin        Social History     Social History    Marital status: Married     Spouse name: N/A    Number of children: N/A    Years of education: N/A     Occupational History    Not on file.     Social History Main Topics    Smoking status: Never Smoker    Smokeless tobacco: Never Used     Alcohol use 0.6 oz/week     1 Glasses of wine per week    Drug use: No    Sexual activity: Yes     Partners: Male     Other Topics Concern    Not on file     Social History Narrative    No narrative on file   She lives in [place], no pets, quit smoking 30 years ago.    Family history: Father with allergic rhinitis    Review of patient's allergies indicates:  Allergies   Allergen Reactions    Erythromycin Other     Strange smells but no rash or trouble breathing.    Ibuprofen      TX FROM PAPER CHART      Penicillins      TX FROM PAPER CHART      Sulfa Antibiotics      TX FROM PAPER CHART       A complete ROS was performed and negative other than per HPI and as follows- Fatigue/trouble sleeping, wears glasses/contacts, numbness/tingling, headaches, recent cough/cold,  heartburn/indigestion, constipation, joint/back pain, heat/cold intolerance, anxiety.    Physical Exam:  BP 103/57    Pulse 60    Ht 5\' 8"  (1.727 m)    Wt 152 lb (68.9 kg)    SpO2 98%    BMI 23.11 kg/m   Gen: Alert and Oriented NAD   HEENT: PERRL, no swelling of posterior oropharynx   CV: RRR, normal S1 and S2  Resp: CTA, no wheezes or crackles  Ext: No c/c/e  Skin: No rashes on upper extremities or face  Psych: Normal mood and affect    PrePen Skin Testing    Testing was performed to evaluate for poissible IgE mediated hypersensitivity to PrePen and penicillin G.    Skin Testing  Interpretation of Skin Test Results:  0: No reaction  +: Erythema ?  ++: Erythema ? mm with wheal < 3 mm  +++: Wheal 3-6 mm  ++++: Wheal > 6 mm or pseudopod     Histamine (1mg /ml):  ++++  Saline:    0    PrePen:   0  Pen G:    0    Intradermal Testing (duplicates with Prepen and Penicillin G)  Interpretation of Intradermal Test Results:  0: 2 mm or less  +: 3-4 mm wheal with erythema  ++: 4-8 mm wheal with erythema  +++: >8 mm wheal with erythema  ++++: Wheal with pseudopods    Histamine (0.1mg /ml): ++++  Saline:    0    PrePen:   0  0  Pen G:    0     0    Interpretation: Negative to Pre-Pen and penicillin G      Oral Challenge Procedure: Amoxicillin    He was given amoxicillin 250mg /775ml in incremental doses:    Time:  Dose:  9:23:   Amoxicillin 0.5 ml (25 mg) at and monitored for 15 minutes without a reaction.    9:43:   Amoxicillin 4.5 ml (225 mg) and monitored for over 1 hour.    In total he was monitored for 80 minutes after her initial dose and did not develop a reaction.    Monitored for one hour after the final dose, did not develop any wheezing, chest tightness, shortness of breath or diffuse rash.  Asymptomatic at discharge.    Impression of oral challenge- No evidence for IgE mediated hypersensitivity.               IMPRESSION/RECOMMENDATIONS    1. Adverse drug reactions- Penicillin- her reaction took place as a child and her parents never told her what her specific reaction was.  She recently had a severe upper respiratory infection with a persistent cough that has subsequently resolved.  She was on a cruise when she developed the symptoms and at that time ideally she would have been started on a penicillin medication.  She would like to know whether she is truly allergic to penicillin.  There was no reaction on Prepen or penicillin G testing today and tolerated amoxicillin challenge.  This rules out an IgE mediated hypersensitivity reaction.  We discussed that it is possible to develop a delayed reaction but these are often not reproducible and often do not preclude future use.  If she does develop a rash to send me a picture to further clarify the reaction.   Recommendations:   -- Will take penicillin off her allergy list  -- Instructed patient to contact me if she develops a delayed rash.     Thank  you for the consultation on this very pleasant patient, please to not hesitate to contact me with questions- Pager: [phone], Email: [email or web address].    This note was constructed using Public affairs consultant.

## 2016-12-26 ENCOUNTER — Encounter (INDEPENDENT_AMBULATORY_CARE_PROVIDER_SITE_OTHER): Payer: Self-pay | Admitting: Allergy/Immunology

## 2016-12-26 ENCOUNTER — Ambulatory Visit: Payer: Medicare HMO | Attending: Family Medicine | Admitting: Allergy/Immunology

## 2016-12-26 VITALS — BP 103/57 | HR 60 | Ht 68.0 in | Wt 152.0 lb

## 2016-12-26 DIAGNOSIS — Z880 Allergy status to penicillin: Secondary | ICD-10-CM

## 2016-12-26 NOTE — Patient Instructions (Addendum)
PrePen Skin Testing    Testing was performed to evaluate for poissible IgE mediated hypersensitivity to PrePen and penicillin G.    Skin Testing  Interpretation of Skin Test Results:  0: No reaction  +: Erythema ?  ++: Erythema ? mm with wheal < 3 mm  +++: Wheal 3-6 mm  ++++: Wheal > 6 mm or pseudopod     Histamine (1mg /ml):  ++++  Saline:    0    PrePen:   0  Pen G:    0    Intradermal Testing (duplicates with Prepen and Penicillin G)  Interpretation of Intradermal Test Results:  0: 2 mm or less  +: 3-4 mm wheal with erythema  ++: 4-8 mm wheal with erythema  +++: >8 mm wheal with erythema  ++++: Wheal with pseudopods    Histamine (0.1mg /ml): ++++  Saline:    0    PrePen:   0  0  Pen G:    0    0    Interpretation: Negative to Pre-Pen and penicillin G      Your testing was negative today and you tolerated amoxicillin.    This rules out a severe mediated reaction.    While it is rare, delayed rashes can occur.  Contact me if you develop any delayed symptoms.    I am going to take penicillin off of your allergy list.    It was a pleasure meeting you.    Cassie Contreras    Drew Timmi Devora, MD  [email or web address]  Allergy and Immunology  [facility]     This note was constructed using Print production plannervoice recognition technology.

## 2017-02-02 ENCOUNTER — Telehealth (INDEPENDENT_AMBULATORY_CARE_PROVIDER_SITE_OTHER): Payer: Self-pay | Admitting: Family Medicine

## 2017-02-02 ENCOUNTER — Ambulatory Visit (INDEPENDENT_AMBULATORY_CARE_PROVIDER_SITE_OTHER): Payer: Self-pay | Admitting: Family Medicine

## 2017-02-02 NOTE — Telephone Encounter (Signed)
Pt called right back and wanted to know if she could just drop off a lab sample as well.

## 2017-02-02 NOTE — Telephone Encounter (Signed)
Please call pt and explain that she has to be seen in clinic--ideally in urgent care today or at the latest tomorrow in clinic

## 2017-02-02 NOTE — Telephone Encounter (Signed)
"  I started having burning when I urinate last night when I got up in the middle of the night to pee" ""the burning has increased throughout the day" "I used to get these a lot many years ago"      Reason for Disposition   Age > 50 years    Additional Information   Negative: Shock suspected (e.g., cold/pale/clammy skin, too weak to stand)   Negative: Sounds like a life-threatening emergency to the triager   Negative: Unable to urinate (or only a few drops) and bladder feels very full   Negative: Patient sounds very sick or weak to the triager   Negative: Severe pain with urination   Negative: Fever > 100.5 F (38.1 C)   Negative: Side (flank) or lower back pain present   Negative: Taking antibiotic > 24 hours for UTI and fever persists   Negative: Taking antibiotic > 3 days for UTI and painful urination not improved   Negative: Unusual vaginal discharge   Negative: > 2 UTIs in last year   Negative: Patient is worried about sexually transmitted disease (STD)    Protocols used: URINATION PAIN - FEMALE-ADULT-[place]

## 2017-02-02 NOTE — Telephone Encounter (Signed)
**  Complete Incoming Call Section as well**    RETURN CALL: General message OK      SUBJECT: General Message     REASON FOR REQUEST: UTI    MESSAGE: pt calling, feels like she is getting a bladder infection. No openings at clinic today or tomorrow, pt declines UC. Routing to Nurse Triage to advise on patient symptoms.  Please call pt at [phone] to advise.

## 2017-02-02 NOTE — Telephone Encounter (Signed)
Pt is at the UC. Closing te.

## 2017-02-02 NOTE — Telephone Encounter (Signed)
"  I started having burning when I urinate last night when I got up in the middle of the night to pee" ""the burning has increased throughout the day" "I used to get these a lot many years ago"    Unable to appoint in clinic   Declined Urgent Care  Requesting to drop off a urine sample today

## 2017-02-08 ENCOUNTER — Encounter (INDEPENDENT_AMBULATORY_CARE_PROVIDER_SITE_OTHER): Payer: Self-pay | Admitting: Family Medicine

## 2017-02-08 NOTE — Telephone Encounter (Signed)
Called patient and scheduled for tomorrow, 02/09/2017, 1:20 pm with Dr. Rande Bruntastet.

## 2017-02-09 ENCOUNTER — Ambulatory Visit (INDEPENDENT_AMBULATORY_CARE_PROVIDER_SITE_OTHER): Payer: Medicare HMO | Admitting: Registered Nurse

## 2017-02-09 ENCOUNTER — Encounter (INDEPENDENT_AMBULATORY_CARE_PROVIDER_SITE_OTHER): Payer: Medicare HMO | Admitting: Family Medicine

## 2017-02-09 VITALS — BP 110/60 | HR 54 | Temp 96.6°F | Resp 16 | Wt 152.8 lb

## 2017-02-09 DIAGNOSIS — R300 Dysuria: Secondary | ICD-10-CM

## 2017-02-09 DIAGNOSIS — Z6823 Body mass index (BMI) 23.0-23.9, adult: Secondary | ICD-10-CM

## 2017-02-09 DIAGNOSIS — N390 Urinary tract infection, site not specified: Secondary | ICD-10-CM

## 2017-02-09 DIAGNOSIS — B379 Candidiasis, unspecified: Secondary | ICD-10-CM

## 2017-02-09 DIAGNOSIS — R350 Frequency of micturition: Secondary | ICD-10-CM

## 2017-02-09 LAB — PR U/A AUTO DIPSTICK ONLY, ONSITE
Bilirubin, Urine: NEGATIVE
Glucose, Urine: NEGATIVE mg/dL
Ketones, URN: NEGATIVE mg/dL
Leukocytes: NEGATIVE
Nitrite, URN: NEGATIVE
Occult Blood, URN: NEGATIVE
Protein: NEGATIVE mg/dL
Specific Gravity, Urine: 1.015 (ref 1.005–1.030)
Urobilinogen, URN: 0.2 E.U./dL (ref 0.2–1.0)
pH, URN: 6 (ref 5.0–8.0)

## 2017-02-09 MED ORDER — PHENAZOPYRIDINE HCL 200 MG OR TABS
200.0000 mg | ORAL_TABLET | Freq: Two times a day (BID) | ORAL | 0 refills | Status: DC
Start: 2017-02-09 — End: 2017-06-20

## 2017-02-09 MED ORDER — FLUCONAZOLE 150 MG OR TABS
150.0000 mg | ORAL_TABLET | Freq: Once | ORAL | 0 refills | Status: AC
Start: 2017-02-09 — End: 2017-02-09

## 2017-02-09 NOTE — Progress Notes (Signed)
Reason for visit: Follow up on bladder infection     Have seen a specialist since your last visit: NO     HM Due:   Health Maintenance   Topic Date Due   • Hepatitis C Screening  02/06/1964   • HIV Screening  02/05/1978   • Colorectal Cancer Screening (FOBT/FIT)  02/05/2013   • Influenza Vaccine (1) 10/24/2015   • Diabetes Screening  05/10/2018   • Lipid Disorders Screening  12/08/2019   • Tetanus Vaccine  12/05/2023           No future appointments.

## 2017-02-09 NOTE — Progress Notes (Signed)
Patient Referred By: No ref. provider found  Patient's PCP: Burns SpainHei, Thomas Kou, MD     Subjective:  Patient is a 67 year old female, here to discuss Other (Follow up on bladder infection )    The following portions of the patient's history were reviewed with the patient and updated as appropriate: problem list, current medications and allergies.    HPI   [facility] and was diagnosed with a UTI and treated with Macrobid 100 mg BID x 7 days. Her last dose was today. Her symptoms have improved but patient feels like they have not entirely resolved. She still complains of discomfort (not pain) with urination and feeling of incomplete emptying. 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 She hasn't had a UTI for the last 2-3 years until this recent UTI.       Review of Systems   Constitutional: Negative for chills, fatigue and fever.   Respiratory: Negative for chest tightness, shortness of breath and wheezing.    Cardiovascular: Negative for chest pain and palpitations.   Genitourinary: Negative for difficulty urinating, flank pain, hematuria and pelvic pain.         Objective:  Physical Exam   Constitutional: Vital signs are normal. She appears well-developed and well-nourished. She does not appear ill. No distress.   Cardiovascular: Normal rate, regular rhythm and normal heart sounds.    Pulmonary/Chest: Effort normal and breath sounds normal.   Abdominal: There is no tenderness. There is no CVA tenderness.        Assessment and Plan:     Recurrent UTI  Frequency of urination  Dysuria  -     U/A AUTO DIPSTICK ONLY, ONSITE  -     Urine Culture  -     REFERRAL TO UROLOGY  -     Phenazopyridine HCl (PYRIDIUM) 200 MG Oral Tab; Take 1 tablet (200 mg) by mouth 2 times a day after meals. For urinary discomfort.  U/A came back negative for  infection. Sent for urine culture, awaiting results. Because patient continues to have symptoms after 7 day course of macrobid, referred to urogynecology to discuss other possible causes of symptoms. Pyridium for urinary discomfort.     Return to clinic if symptoms worsen or fail to improve.      Jessica Priestavid W Arrion Burruel, ARNP

## 2017-02-11 LAB — URINE C/S: Culture: 7000

## 2017-02-12 ENCOUNTER — Encounter (INDEPENDENT_AMBULATORY_CARE_PROVIDER_SITE_OTHER): Payer: Self-pay | Admitting: Family Medicine

## 2017-02-12 DIAGNOSIS — L709 Acne, unspecified: Secondary | ICD-10-CM

## 2017-02-12 NOTE — Telephone Encounter (Signed)
Please see ecare message and advise      Pharmacy loaded medication not on med list

## 2017-02-15 ENCOUNTER — Telehealth (HOSPITAL_BASED_OUTPATIENT_CLINIC_OR_DEPARTMENT_OTHER): Payer: Self-pay

## 2017-02-15 MED ORDER — TRETINOIN 0.05 % EX CREA
TOPICAL_CREAM | Freq: Every evening | CUTANEOUS | 2 refills | Status: DC
Start: 2017-02-15 — End: 2017-06-20

## 2017-02-15 NOTE — Telephone Encounter (Signed)
Spoke to pt advised we have her record from [facility]

## 2017-03-07 ENCOUNTER — Encounter (INDEPENDENT_AMBULATORY_CARE_PROVIDER_SITE_OTHER): Payer: Self-pay | Admitting: Family Medicine

## 2017-03-07 DIAGNOSIS — R002 Palpitations: Secondary | ICD-10-CM

## 2017-03-07 DIAGNOSIS — F419 Anxiety disorder, unspecified: Secondary | ICD-10-CM

## 2017-03-07 MED ORDER — CLONAZEPAM 0.5 MG OR TABS
0.5000 mg | ORAL_TABLET | Freq: Every day | ORAL | 0 refills | Status: DC | PRN
Start: 2017-03-07 — End: 2018-03-22

## 2017-03-07 NOTE — Telephone Encounter (Signed)
Metoprolol still pending.    Clonazepam has been faxed.

## 2017-03-07 NOTE — Telephone Encounter (Signed)
Pt is asking for a refill of Metoprolol Succinate 25mg  --take once daily by mouth  Clonazepam 0.5 mg--take one table as needed for anxiety or sleep-- 20 tablets (It takes me over one year to use the perscription of 20 tablets)

## 2017-03-08 MED ORDER — METOPROLOL SUCCINATE ER 25 MG OR TB24
25.0000 mg | EXTENDED_RELEASE_TABLET | Freq: Every day | ORAL | 3 refills | Status: DC
Start: 2017-03-08 — End: 2017-06-20

## 2017-03-10 ENCOUNTER — Encounter (INDEPENDENT_AMBULATORY_CARE_PROVIDER_SITE_OTHER): Payer: Self-pay | Admitting: Family Medicine

## 2017-03-19 ENCOUNTER — Encounter (INDEPENDENT_AMBULATORY_CARE_PROVIDER_SITE_OTHER): Payer: Self-pay | Admitting: Family Medicine

## 2017-03-20 ENCOUNTER — Telehealth (INDEPENDENT_AMBULATORY_CARE_PROVIDER_SITE_OTHER): Payer: Self-pay | Admitting: Family Medicine

## 2017-03-20 NOTE — Telephone Encounter (Signed)
Will defer to PCP as I am unsure.  She had an ultrasound of her thyroid with her endocrinologist, so let's wait for Dr. Maylon CosHei to return to address this.  Please let her know he will return 2/28.  Thanks!

## 2017-03-20 NOTE — Telephone Encounter (Signed)
**  Complete Incoming Call Section as well**    RETURN CALL: General message OK      SUBJECT: General Message     REASON FOR REQUEST: Ultrasound     MESSAGE: Pt states that she received an ecare message to call the clinic to schedule an US. Only US order I see is from 05/2016. No mention of this in most recent OV either? Routing to provider for advisement on this?

## 2017-03-21 NOTE — Telephone Encounter (Signed)
Pleaes call patient to clarify because we don't have record of this other than an ultrasound for her thyroid in May 2018 which we also have not received a report for.  [facility]

## 2017-03-21 NOTE — Telephone Encounter (Signed)
Pt states that she didn't want to go to [facility]. States that she got results back for a [facility]? Do not see anything in pt's chart but asked pt to send us information of where she had this done so that we could request records. Nothing further needed at this time. FYI to provider. Closing

## 2017-03-22 ENCOUNTER — Telehealth (HOSPITAL_BASED_OUTPATIENT_CLINIC_OR_DEPARTMENT_OTHER): Payer: Self-pay | Admitting: Endocrinology

## 2017-03-22 DIAGNOSIS — E041 Nontoxic single thyroid nodule: Secondary | ICD-10-CM

## 2017-03-22 DIAGNOSIS — M8588 Other specified disorders of bone density and structure, other site: Secondary | ICD-10-CM

## 2017-03-22 NOTE — Telephone Encounter (Signed)
(  TEXTING IS AN OPTION FOR [facility] CLINICS ONLY)  Is this a [facility] clinic? No      RETURN CALL: OK to leave detailed message with anyone that answers      SUBJECT:  Orders Request      VERBAL OR FAXED ORDER REQUEST: call patient  ORDERING PROVIDER: Tylee  ORDERS ARE FOR: Other blood test for increased antibodies  ADDITIONAL INFORMATION: call patient

## 2017-03-22 NOTE — Telephone Encounter (Signed)
[facility]. Labs ordered for 05/07/17 visit.

## 2017-03-22 NOTE — Telephone Encounter (Signed)
[facility] for [facility]/15 at 1000.

## 2017-03-23 ENCOUNTER — Ambulatory Visit: Payer: Medicare HMO | Attending: Family Medicine

## 2017-03-23 ENCOUNTER — Encounter (INDEPENDENT_AMBULATORY_CARE_PROVIDER_SITE_OTHER): Payer: Self-pay | Admitting: "Endocrinology

## 2017-03-23 DIAGNOSIS — M8588 Other specified disorders of bone density and structure, other site: Secondary | ICD-10-CM | POA: Insufficient documentation

## 2017-03-23 DIAGNOSIS — E041 Nontoxic single thyroid nodule: Secondary | ICD-10-CM | POA: Insufficient documentation

## 2017-03-23 LAB — COMPREHENSIVE METABOLIC PANEL
ALT (GPT): 35 U/L — ABNORMAL HIGH (ref 7–33)
AST (GOT): 37 U/L (ref 9–38)
Albumin: 4.2 g/dL (ref 3.5–5.2)
Alkaline Phosphatase (Total): 77 U/L (ref 38–172)
Anion Gap: 6 (ref 4–12)
Bilirubin (Total): 0.6 mg/dL (ref 0.2–1.3)
Calcium: 9 mg/dL (ref 8.9–10.2)
Carbon Dioxide, Total: 29 meq/L (ref 22–32)
Chloride: 100 meq/L (ref 98–108)
Creatinine: 0.8 mg/dL (ref 0.38–1.02)
GFR, Calc, African American: >60 mL/min/{1.73_m2} (ref >59)
GFR, Calc, European American: >60 mL/min/{1.73_m2} (ref >59)
Glucose: 77 mg/dL (ref 62–125)
Potassium: 4.8 meq/L (ref 3.6–5.2)
Protein (Total): 6.5 g/dL (ref 6.0–8.2)
Sodium: 135 meq/L (ref 135–145)
Urea Nitrogen: 18 mg/dL (ref 8–21)

## 2017-03-23 LAB — T3: Triiodothyronine (T3): 83 ng/dL (ref 73–178)

## 2017-03-23 LAB — THYROID STIMULATING HORMONE: Thyroid Stimulating Hormone: 1.16 u[IU]/mL (ref 0.400–5.000)

## 2017-03-23 LAB — T4, FREE: Thyroxine (Free): 1 ng/dL (ref 0.6–1.2)

## 2017-03-24 ENCOUNTER — Telehealth (INDEPENDENT_AMBULATORY_CARE_PROVIDER_SITE_OTHER): Payer: Self-pay | Admitting: Family Medicine

## 2017-03-24 NOTE — Telephone Encounter (Signed)
Routing to clinical staff pls ck on this next wk

## 2017-03-24 NOTE — Telephone Encounter (Signed)
See eCare. Please call if no response next week

## 2017-03-26 LAB — ANTI THYROID PEROXIDASE: Anti Thyroid Peroxidase: 41.2 [IU]/mL — ABNORMAL HIGH (ref 0.0–8.9)

## 2017-03-26 NOTE — Telephone Encounter (Signed)
Patient replied to provider

## 2017-03-27 LAB — VITAMIN D (25 HYDROXY)
Vit D (25_Hydroxy) Total: 30.6 ng/mL (ref 20.1–50.0)
Vitamin D2 (25_Hydroxy): <1 ng/mL
Vitamin D3 (25_Hydroxy): 30.6 ng/mL

## 2017-03-27 NOTE — Telephone Encounter (Signed)
Courtesy response sent.  Will leave for PCP to address since he's familiar with case.

## 2017-04-03 LAB — N-TELOPEPTIDE W/ CREATININE, URINE
Creatinine/24H, Urine: UNDETERMINED mg/(24.h) (ref 700–1800)
Creatinine/Unit, Urine: 55 mg/dL
N Telopeptide Corrected for Creatinine: 30 nmol{BCE}/mmol{creat}

## 2017-04-03 NOTE — Telephone Encounter (Addendum)
Dr. Jacqualine Mauylee message said "I did review these, and there is essentially no change between the two studies. The nodule is stable in size."  Encouraged to [facility].  I updated patient and will leave e-care message in PCP box for review.

## 2017-04-03 NOTE — Progress Notes (Signed)
UROLOGY OUTPATIENT CONSULTATION:     REFERING PHYSICIAN    Jessica Priest, ARNP  [address], [address]  [place], [place] [address]    PCP  Vinnie Langton, MD  [phone] [facility] [address]  [place], [place] [address]    REASON FOR REFERRAL/CHIEF COMPLAINT  Chief Complaint   Patient presents with    Urinary Tract Infection     Recurrent UTI       HISTORY OF PRESENT ILLNESS  Cassie Contreras is a 67 year old female referred to me by Clarene Duke ARNP for urinary incontinence and recurrent UTI.     The patient has had chronic bladder infections since she was 68 years old s/p "cutting of my urethra" (? urethral meatotomy?) by a urologist at the time. In her mid 30s, she began having more chronic bladder infections with regular urethral dilations once every 5 years with improved force of stream and ? decreased utis. She was subsequently seen by Dr. Daisy Floro [facility]) where she was placed on a daily dose of Macrobid to prevent UTI for a year followed by intermittent treatment with her PCP with uti rate of about 2-3/year. She has continued to have 2-3 UTIs a year. UTI symptoms include dysuria, hematuria, and lower abdominal bloating/heaviness with infections. She continues to use oral (though this is being tapered) and vaginal estrogen.     Recently she has had intermittent lower abdominal heaviness and bloating with episode of sharp pain to the right lower abdomen with a palpable intermittent"lump". She thinks this was deeper than under the skin and wonders if this was her bowel. This pain was different than regular menstrual cramps. She has IBS with diarrhea and constipation. She states she is more likely to get uti when she has diarrheal episodes. She does not currently manage her IBS, but takes Miralax with constipation, usually once every 3 months. She has tried taking Metamucil in the past without benefit.     She has also had urinary incontinence since 2005. She had a urodynamics study done by Dr. Daisy Floro, who  recommended a sling. She deferred surgery at the time and was placed on Detrol and Flomax, which helped. After her symptoms improved, she was taken off the Detrol but has continued to take Flomax. She preemptively voids every few hours, but is able to hold her urine for a few hours. She reports nocturia once a night but does not leak at night. She notes  urine leakage with coughing , sneezing and sometimes with walking. Wears mini pad daily. With colds will have to change this 4-5/day, ,other wise once/day.She also leaks sometimes with intercourse during intercourse with/without orgasm.  She uses 1-4 panty liners a day, depending on the day. She has some hesitancy but consistent flow (thinks slower than most women) once she starts going. Constipation usually worsens her leakage. Her urinary incontinence is worse with coffee intake. She notes that her leakage is worse with walking and coughing. She has never had physical therapy in the past but has attempted Kegel exercises [facility]. She is aware of complications with surgical treatment and would like to avoid if possible at this time.     Past Medical Hx:    Past Medical History:   Diagnosis Date    Abdominal pain     Anxiety     Arthritis     Asthma     Constipation     Depression     GERD (  gastroesophageal reflux disease)     Headache     Heart palpitations     History of irritable bowel syndrome     Hormone disorder     Metatarsal stress fracture of right foot     TX FROM PAPER CHART      Primary insomnia     Squamous cell carcinoma in situ of skin     Thyroid disease     UTI (urinary tract infection)      Patient Active Problem List   Diagnosis    Os peroneum syndrome of left foot    S/P foot & ankle surgery, left    Neuritis of left sural nerve    Recurrent UTI    Osteopenia of spine    Palpitations    Atrophic vaginitis    Hormone replacement therapy    Thyroid nodule       Past Surgical Hx:    Past Surgical History:    Procedure Laterality Date    Tonsilectomy      Childhood    URETHRAL DILATION/DVIU     Foot surgery 2014     Family and Social Hx:    Cassie Contreras , family history is not on file.. She  reports that she has never smoked. She has never used smokeless tobacco. She reports that she drinks about 0.6 oz of alcohol per week. She reports that she does not use drugs..    Social History     Social History Narrative    Not on file       Active Meds:    Outpatient Medications Marked as Taking for the 04/04/17 encounter (Office Visit) with Elon Spanner, MD   Medication Sig Dispense Refill    Acetaminophen (TYLENOL EXTRA STRENGTH) 500 MG Oral Tab Take 500 mg by mouth every 4 hours as needed.      Bimatoprost (LATISSE) 0.03 % External Solution Apply 1 application topically. Apply nightly to upper eyelid at base of eyelashes with applicator. Blot excess solution. Repeat for opposite eyelid using new applicator.      ClonazePAM 0.5 MG Oral Tab Take 1 tablet (0.5 mg) by mouth daily as needed (anxiety). 20 tablet 0    Ibuprofen-Diphenhydramine Cit (ADVIL PM OR) Take 200 mg by mouth.      Metoprolol Succinate ER 25 MG Oral TABLET SR 24 HR Take 1 tablet (25 mg) by mouth daily. 90 tablet 3    Phenazopyridine HCl (PYRIDIUM) 200 MG Oral Tab Take 1 tablet (200 mg) by mouth 2 times a day after meals. For urinary discomfort. 10 tablet 0    PREMPRO 0.3-1.5 MG Oral Tab 1 tab once a week 36 tablet 3    Tamsulosin HCl (FLOMAX) 0.4 MG Oral Cap Take 1 capsule (0.4 mg) by mouth at bedtime. 90 capsule 3    Tretinoin 0.05 % External Cream Apply to affected area on face at bedtime. 20 g 2    VAGIFEM 10 MCG Vaginal Tab Place 1 tablet (10 mcg) into the vagina 3 times a week. 36 tablet 3       Allergies:    Allergies as of 04/04/2017 - Reviewed 04/04/2017   Allergen Reaction Noted    Erythromycin Other 07/22/2012    Ibuprofen  07/19/2012    Sulfa antibiotics  07/19/2012       Review of Systems:    Constitutional: Positive for  headache   Eyes: Positive for blurred vision.   Ears, Nose, Mouth, Throat: Negative  Cardiovascular: Positive for varicose veins.   Respiratory: Negative    Gastrointestinal: Positive for nausea, abdominal pain, and indigestion.   Genitourinary: Positive for hematuria, dysuria, urinary frequency, nocturia, incontinence, slow stream, and bladder pain    Musculoskeletal: Positive for joint pain and back pain.   Skin: Negative    Neurological: Positive for tremors, dizzy spells, numbness and tingling to the left foot.   Psychiatric: Negative    Endocrine: Negative    Hematologic/Lymphatic: Negative   Allergic/Immunologic: Positive for drug allergies.    OBJECTIVE:  Physical Exam:    BP 114/61    Pulse 52    Ht 5\' 8"  (1.727 m)    Wt 152 lb (68.9 kg)    BMI 23.11 kg/m   General: alert, no distress, healthy, well nourished  Skin: Skin color, texture, turgor normal. No rashes or concerning lesions.  Head: Normocephalic. No masses or lesions.  Eyes: Lids/periorbital skin normal, conjunctivae anicteric, EOMI.  Ears: Normal external pinnae.  Oropharynx: Moist mucous membranes.  Lungs: Symmetric chest expansion, unlabored breathing on room air.  Heart: No lower extremity edema bilaterally, extremities warm, well perfused.  Abd: Soft, non-tender, non-distended, no masses appreciated  Back: Normal range of motion  Pelvic: nl ext gen, vaginal ph 4.5, very mild urethral/AVWmobility, ut/cx/PVW well supported. No masses, tenderness fluctuance. Adnexa clear/nt, pt with some coaching can contract pelvic floor, but weak and poorly sustained. NRST without masses, some perineal weakness and shortening.  Neurological: Alert and oriented x 3, muscle tone and strength normal, symmetric.    Labs, medical images, tracings, or specimens reviewed today with the patient include:  Labs Reviewed today with the patient include:  Results for orders placed or performed in visit on 03/22/17   Thyroid Stimulating Hormone   Result Value Ref Range     Thyroid Stimulating Hormone 1.160 0.400 - 5.000 u[IU]/mL   Free T4   Result Value Ref Range    Thyroxine (Free) 1.0 0.6 - 1.2 ng/dL   T3   Result Value Ref Range    Triiodothyronine (T3) 83 73 - 178 ng/dL   ANTI THYROID PEROXIDASE   Result Value Ref Range    Anti Thyroid Peroxidase 41.2 (H) 0.0 - 8.9 [IU]/mL   Vitamin D (25 Hydroxy)   Result Value Ref Range    Vitamin D2 (25_Hydroxy) <1.0 ng/mL    Vitamin D3 (25_Hydroxy) 30.6 ng/mL    Vit D (25_Hydroxy) Total 30.6 20.1 - 50.0 ng/mL    Vit D (25_Hydroxy) Interp Normal:            20.1-50.0 ng/mL    N-TELOPEPTIDE, URINE   Result Value Ref Range    Creatinine Interval, URN Random h    Creatinine Total Volume, URN Random mL    Creatinine/Unit, URN 55 mg/dL    GNFAOZHYQM/57QCreatinine/24H, URN Unable to calculate value on a random specimen. 700 - 1800 mg/(24.h)    N Telopeptide Corrected for Creatinine 30 nmol[BCE]/mmol[creat]   Comprehensive Metabolic Panel   Result Value Ref Range    Sodium 135 135 - 145 meq/L    Potassium 4.8 3.6 - 5.2 meq/L    Chloride 100 98 - 108 meq/L    Carbon Dioxide, Total 29 22 - 32 meq/L    Anion Gap 6 4 - 12    Glucose 77 62 - 125 mg/dL    Urea Nitrogen 18 8 - 21 mg/dL    Creatinine 4.690.80 6.290.38 - 1.02 mg/dL    Protein (Total) 6.5 6.0 - 8.2 g/dL  Albumin 4.2 3.5 - 5.2 g/dL    Bilirubin (Total) 0.6 0.2 - 1.3 mg/dL    Calcium 9.0 8.9 - 16.1 mg/dL    AST (GOT) 37 9 - 38 U/L    Alkaline Phosphatase (Total) 77 38 - 172 U/L    ALT (GPT) 35 (H) 7 - 33 U/L    GFR, Calc, European American >60 >59 mL/min/[1.73_m2]    GFR, Calc, African American >60 >59 mL/min/[1.73_m2]    GFR, Information       Calculated GFR in mL/min/1.73 m2 by MDRD equation.  Inaccurate with changing renal function.  See http://depts.ThisTune.it.html       UA AUTO DIPSTICK ONLY 02/09/17  Component Value Ref Range & Units Status Lab   Color, Urine YELLOW   Final FACTORIA   Clarity, URN CLEAR   Final FACTORIA   Glucose, Urine NEG  NEG mg/dL Final FACTORIA   Bilirubin,  Urine NEG  NEG Final FACTORIA   Ketones, URN NEG  NEG mg/dL Final FACTORIA   Specific Gravity, Urine 1.015  1.005 - 1.030 Final FACTORIA   Occult Blood, URN NEG  NEG Final FACTORIA   pH, URN 6.0  5.0 - 8.0 Final FACTORIA   Protein NEG  NEG-TRACE mg/dL Final FACTORIA   Urobilinogen, URN 0.2  0.2 - 1.0 E.U./dL Final FACTORIA   Nitrite, URN NEG  NEG Final FACTORIA   Leukocytes NEG  NEG Final FACTORIA     URINE CULTURE 02/11/17  Component Lab   Special Requests [place]   None    Culture [place]   7000   Col/mL   Mixed flora (>= 3 colony types)      Pt had no urge to void today so no voiding studies were done. 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     We discussed that stress incontinence is related to a weak outlet, which can be treated with a sling as recommended by Dr. Daisy Floro in the past as well as PFM strengthening, devices, medications, bulking agents. She has been treated with Detrol in the past with benefit and I explained this is usually prescribed for urge incontinence. Given her symptoms, we discussed that she may have a mixture of stress and urge incontinence, as well as spontaneous leakage. However, treatment for urinary incontinence is a quality of life issue and will be dependent on what treatments she is amenable with. She would like to defer any surgery for now, as she is worried about complications. Although her new intermittent abdominal bloating and RLQ mass/discomfort is likely her bowels-will proceed with pelvic ultrasound to further evaluate ovaries.    -  Continue vaginal estrace cream   -  Restart Detrol  LA 4mg  PRN  -  Start pelvic floor exercises at physical therapy   -  Consider uroflow study for next appointment as she does not need to void during today's visit   -   Pelvic ultrasound  -  RTC in 3-4  months or sooner prn     Thank Cassie Contreras very much for referring  Cassie Contreras to our [place]/Urology/Urogynecology Clinic    Marylou Mccoy, MD  Associate Professor  Department of Urology  [facility]    04/04/2017 @ 8:52 AM - I, Nuala Alpha, Medical Scribe acted as a Neurosurgeon and  documented the service/procedure performed to the best of my knowledge in the presence of Marylou Mccoy, MD who will provide the final review and authentication.  Signed: Nuala Alpha, Medical Scribe

## 2017-04-04 ENCOUNTER — Encounter (HOSPITAL_BASED_OUTPATIENT_CLINIC_OR_DEPARTMENT_OTHER): Payer: Self-pay | Admitting: Urology

## 2017-04-04 ENCOUNTER — Ambulatory Visit: Payer: Medicare HMO | Attending: Urology | Admitting: Urology

## 2017-04-04 VITALS — BP 114/61 | HR 52 | Ht 68.0 in | Wt 152.0 lb

## 2017-04-04 DIAGNOSIS — R140 Abdominal distension (gaseous): Secondary | ICD-10-CM | POA: Insufficient documentation

## 2017-04-04 DIAGNOSIS — M6281 Muscle weakness (generalized): Secondary | ICD-10-CM | POA: Insufficient documentation

## 2017-04-04 DIAGNOSIS — Z6823 Body mass index (BMI) 23.0-23.9, adult: Secondary | ICD-10-CM

## 2017-04-04 DIAGNOSIS — R1903 Right lower quadrant abdominal swelling, mass and lump: Secondary | ICD-10-CM | POA: Insufficient documentation

## 2017-04-04 DIAGNOSIS — N3946 Mixed incontinence: Secondary | ICD-10-CM | POA: Insufficient documentation

## 2017-04-04 MED ORDER — TOLTERODINE TARTRATE ER 4 MG OR CP24
EXTENDED_RELEASE_CAPSULE | ORAL | 3 refills | Status: DC
Start: 2017-04-04 — End: 2017-06-20

## 2017-04-04 NOTE — Progress Notes (Signed)
Loleta RoseI, Lowell Makara L Raisa Ditto, MD, personally performed the services described in this documentation, as scribed by Lupe Carneyebekah, You  in my presence, and it is both accurate and complete.

## 2017-04-04 NOTE — Patient Instructions (Signed)
Consider trial of Poise Impressa device-see handout  See uti handout, PT referral list (use as needed)

## 2017-04-05 ENCOUNTER — Ambulatory Visit: Payer: Medicare HMO | Attending: Otolaryngology | Admitting: Speech-Language Pathologist

## 2017-04-05 ENCOUNTER — Ambulatory Visit: Payer: Medicare HMO | Attending: Otolaryngology | Admitting: Otolaryngology

## 2017-04-05 ENCOUNTER — Encounter (HOSPITAL_BASED_OUTPATIENT_CLINIC_OR_DEPARTMENT_OTHER): Payer: Self-pay | Admitting: Otolaryngology

## 2017-04-05 ENCOUNTER — Telehealth (HOSPITAL_BASED_OUTPATIENT_CLINIC_OR_DEPARTMENT_OTHER): Payer: Self-pay | Admitting: Otolaryngology

## 2017-04-05 VITALS — BP 110/68 | HR 58 | Wt 151.0 lb

## 2017-04-05 DIAGNOSIS — Z87891 Personal history of nicotine dependence: Secondary | ICD-10-CM

## 2017-04-05 DIAGNOSIS — R490 Dysphonia: Secondary | ICD-10-CM | POA: Insufficient documentation

## 2017-04-05 DIAGNOSIS — K219 Gastro-esophageal reflux disease without esophagitis: Secondary | ICD-10-CM

## 2017-04-05 DIAGNOSIS — N8189 Other female genital prolapse: Secondary | ICD-10-CM | POA: Insufficient documentation

## 2017-04-05 DIAGNOSIS — J383 Other diseases of vocal cords: Secondary | ICD-10-CM

## 2017-04-05 DIAGNOSIS — N3946 Mixed incontinence: Secondary | ICD-10-CM | POA: Insufficient documentation

## 2017-04-05 MED ORDER — OMEPRAZOLE 40 MG OR CPDR
40.0000 mg | DELAYED_RELEASE_CAPSULE | Freq: Every day | ORAL | 3 refills | Status: DC
Start: 2017-04-05 — End: 2017-06-20

## 2017-04-05 NOTE — Progress Notes (Signed)
[place] [facility] [place]  NEW PATIENT LARYNGOLOGY CLINIC VISIT    Primary Care Provider: [place], Darlyn Chamber, MD  Referring Physician: Oneida Alar, MD     CC:  Hoarseness    History of Present Illness:      Cassie Contreras is 59F with pmhx of thyroid nodules (s/p FNA, benign) was referred for persistent hoarseness. Patient first noticed a difference in her voice in 2015 while she was taking voice lessons. At the time, she felt that her voice was straining. She has worked with SLP in the past with success. She comes in today because she continues to experience hoarseness. 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 Her voice is unpredictable, breathy, strained and fatigues by the end of the day. More trouble with her voice when she has not used it - for example, early mornings.     She endorses a cough which she attributes to a recent URI in which she was placed on a steroid inhaler since August. Notes that she was intermittent problems with swallowing (2x in the last month) - food and large pills. The food (always solids) gets stuck in her chest . It eventually passes with time and water. Her symptoms has been exacerbated with abx. She does not restrict her food choices, no recent weight loss. No recent PNA. No SOB. Treated with reflux (intermitently, currently not on any, last time was 3 years ago). Feels burning sensation in her esophagus 2-3x/wk, takes TUMs.     Laryngology Questionnaire Scores:    Eating Assessment Tool (EAT): 10/40   Reflux Symptom Index (RSI): 17/45   Voice Handicap Index (VHI): 19//40   Dyspnea Index (DI): NA   Modified [place] Cough Questionnaire [facility]): NA   Singing Voice Handicap Index (SVHI): 18/40    Allergies:  She is allergic to erythromycin; ibuprofen; and sulfa antibiotics.     Past Surgeries:   She  has a past surgical history that includes Tonsilectomy and Urethral Dilation/DVIU.     Past Medical History:  Past Medical History:    Diagnosis Date    Abdominal pain     Anxiety     Arthritis     Asthma     Constipation     Depression     GERD (gastroesophageal reflux disease)     Headache     Heart palpitations     History of irritable bowel syndrome     Hormone disorder     Metatarsal stress fracture of right foot     TX FROM PAPER CHART      Primary insomnia     Squamous cell carcinoma in situ of skin     Thyroid disease     UTI (urinary tract infection)           I personally reviewed and confirmed the past medical history in the record with the patient today.    Family History:  Hashimoto in her aunt and sister     Social History:  Tobacco: Former smoker, quit in her late 20's, smoked 5-10 years. Social smoker at the time. Max 1/2 pack per day.   Caffeine: About 2-3 cups of half decaf and half normal   Alcohol: 2 drinks/week  Marital Status/children: Married   Employment: Education officer, environmental   Diet: General Diet     REVIEW OF SYSTEMS:   General Recent weight gain / loss  Fatigue / Trouble sleeping  Fever / Chills / Night sweats  No  Yes  No   Eye Wear glasses / Contacts  Double vision / Vision changes Yes  No   Ear / Nose /  Mouth / Throat Hearing loss / Hearing aid  Ear / Throat problems  Ringing in ears  Facial pain / Nasal congestion  Drainage from ears / nose  Nose bleeds / Sinus problems  Difficulty swallowing  Hoarseness / Throat pain  Dental problems / Dentures  Snoring / Sleep apnea No  No  No  No  No  Yes  No  Intermittent  Yes  No   Heart Chest pain / Heart problems  High blood pressure No  No   Lung SOB (day or night)  Recent cold or cough No  No   Stomach Nausea/Vomiting  Heartburn / Indigestion  Hepatitis  No  Yes  No   Muscles/  Bones Jaw pain  Joint pain / Stiffness  Fracture / Sprain / Strain No  No  No   Urinary  Urinary Problems Yes   Skin Masses / Bumps / Lumps  Rashes / Lesions No  No   Neuro Dizziness / Loss of balance  Headaches / Seizures  Numbness / Tingling / Weak No  No  No   Mental Health Anxiety /  Depression  Other concerns No  No   Endo Heat / Cold intolerance  Increased thirst Yes  No   Blood /   Lymph Bleeding Problems / Anemia  Swollen or enlarged glands No  No   Imm Hay fever / Allergies  HIV/Aids No  No   PHYSICAL EXAM:   BP 110/68    Pulse 58    Wt 151 lb (68.5 kg)    SpO2 99%    BMI 22.96 kg/m   General: Healthy appearing, no distress  Psychiatric: Appropriate mood and affect  Voice: Mildy strained, breathy, hoarse        Breathing pattern: Normal   Skin: Color, texture, turgor normal. No rashes or concerning lesions on visible skin.  Eyes: Lids/periorbital skin normal, conjunctivae clear, EOMs intact.  Ears: External ears normal. Canals clear. TMs normal..   Nose: Normal external contour  Oropharynx: Lips, mucosa, and tongue normal. Teeth and gums normal, posterior pharynx without erythema or drainage.  Neck/Lymphatic: Supple. No adenopathy. Laryngotracheal structures are midline..  Vascular: Carotid pulses palpable bilaterally..  Cranial Nerves:II-XII intact by exam..     PROCEDURE: Flexible Laryngoscopy with Stroboscopy   Indication: Dysphonia   Attending:  Meredith Staggers, MD  Resident:Martavious Hartel Landis Gandy, MD  SLP: Jacelyn Grip, CCC-SLP   Findings:   Nasopharynx: Clear  Palate elevation and closure:  Clear   Base of tongue, vallecula, epiglottis Clear. Saliva appreciated on the L piriform sinuses clear, clear on the right.   Subglottis: not visualized.   Compression/ hyperfunction: Present   Motion: True vocal folds were bilaterally mobile  On Stroboscopy:   Mucosa: Normal, no lesions. Thinning of the L TVF and Bowing of the R TVF.    Mucosal Wave: Asymmetrical, decrease in Left TVF abduction   Glottic Closure: Full in the setting of excessive strain   Description of Procedure:    The procedure was explained to the patient, who gave verbal consent. Afrin and Lidocaine 4% were used topically for anaesthesia.  The endoscope was passed atraumatically with the findings as described above. The scope was gently  withdrawn.  The patient tolerated the procedure very well complications.  The findings were reviewed with the patient, whose questions  were answered.       Other Diagnostic Studies:  NA    Assessment/Plan:  Cassie Contreras is a 39F presents with mild strained/rough/breathy dysphonia and intermittent solid food dysphagia. Her flex laryngoscopy with stroboscopy is notable for thinning and sluggish  L TVF and bowing of R TVF. Multiple options were discussed with patient including conservative management and injection augmentation. Patient elected for conservative management which entails voice therapy and PPI for her untreated reflux. She will consider injection augmentation if conservative management fails.     Follow-up:  3-6 months with Dr. Gwenyth BouillonMerati        [facility] ATTENDING NOTE  I, Lavena Stanfordlbert Merati, saw and evaluated this patient the day of their clinic visit. I have read and reviewed Dr. Lorie PhenixHau's note and agree.  I was present for the entire endoscopic procedure.    I have personally reviewed the history with the patient as well as going over the working diagnoses and management options.  I have answered the patient's questions to the best of my ability.    Please see the completed Patient Health History - Otolaryngology form available as a scanned document in the patient's medical record. This information was reviewed and confirmed by me with the patient today.

## 2017-04-05 NOTE — Progress Notes (Signed)
SPEECH LANGUAGE PATHOLOGY/LARYNGOLOGY EVALUATION   AND INITIAL PLAN OF CARE     The following patient identifiers were confirmed today: name and date of birth.    Time in: 915  Duration: 60 min   General Assessment  Certification From*: 99/37/16  Certification To*: 96/78/93  Date of Symptom Onset*: 04/05/17  Start of Care Date*: 04/05/17    Visits from start of care: 1  Referring Provider: Dr. Freddi Starr  Treatment/Therapy Diagnosis:   Problem List Items Addressed This Visit     Dysphonia - Primary          CLINICAL IMPRESSION:  Pt. is a 67 year old female with mild strained/rough/breathy dysphonia and intermittent solid food dysphagia in the setting of R>L TVF bowing, L TVF sluggishness, and likely incomplete glottic closure (though glottic closure was complete with excessive strain today). 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 Discussed options for intervention today including voice therapy to unload strain as well as injection augmentation. Of note, voice became stronger with left sided manual compression today. Patient would like to start with the conservative option and would like to pursue voice therapy. MD also prescribed PPIs for untreated reflux. She will then follow up in 3-4 months in clinic and may consider injection augmentation at that time if indicated. Patient would also benefit from esophagram to further evaluate nature of esophageal dysphagia.        PERTINENT MEDICAL/SURGICAL HISTORY:   Past Medical History:   Diagnosis Date    Abdominal pain     Anxiety     Arthritis     Asthma     Constipation     Depression     GERD (gastroesophageal reflux disease)     Headache     Heart palpitations     History of irritable bowel syndrome     Hormone disorder     Metatarsal stress fracture of right foot     TX FROM PAPER CHART      Primary insomnia     Squamous cell carcinoma in situ of skin     Thyroid disease     UTI (urinary tract infection)        Past Surgical History:   Procedure  Laterality Date    Tonsilectomy      Childhood    URETHRAL DILATION/DVIU         HISTORY:  Patient is a 67 year old female with chief complaint of unpredictable hoarseness and intermittent solid food dysphagia for several years. 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 She noticed some unpredictability of her voice for the past 5 years. SHe reported that her voice fluctuates daily, she's been told her voice is soft, her voice gets hoarse easily, and she feels like she strains to talk. She endorsed vocal fatigue. She feels her voice is worse with stress. Her voice is worst in the morning or after she hasn't spoken in a while. She did participate in voice therapy 3-4 years ago and uses straw phonation as a warm up and uses nasal consonants to bring her voice up "into her mask".     She also reported intermittent solid food dysphagia. When she swallows certain dry dense foods (like chicken) she sometimes feels them sticking in her mid-lower chest. If she tries to take a drink when this happens, it doesn't go  down and she regurgitates the water back up. This occurs 2-3 times per month. The frequency of incidents increased after she took antibiotics for bronchitis this past summer. She also reported that eating popcorn can make her cough. Pills can also get stuck in the same place but not as severely. She denied difficulty swallowing water. She eats a general diet, no unintentional weight loss, no pneumonia. She does report a history of reflux for which she has been treated off and on over the years. She is not taking any medication now (last time she was treated was ~5 years ago). She does feel heartburn ~2-3 times per week but she ignores it.    She denied dyspnea. She has a remote smoking history (smoked socially for 5-10 years over 40 years ago). She drinks 2-3 cups of coffee  (half decaf), drinks ~2 alcoholic beverages per week, and adequate water intake.     Prior Level of Function: Before the onset of the current condition, the patient was able to speak and swallow normally.    SUBJECTIVE:   Pt. was alert and ready to participate     Patient Statement: Patient reported voice and swallowing difficulty - see above.     Barriers to learning: none  Preferred Learning style: demonstration, written and verbal  Cultural Practices that Influence Care: none    Pain Rating/Action Taken/Fall report:  See Patient Self Assessment of Pain and falls in medical record in OTO section from today.    Voice Usage: Extraordinary    VHI-10 Score 19/40 EAT-10 Score 10/40   RSI Score 17/45     DI Score -/40 MLCQ Score -/40 SVHI-10 Score 18/40       OBJECTIVE:    Observations:   Resonance: WNL  Focus: posterior tone focus       Respiration: Poor coordination of phonation with respiration    Articulation: WNL    Other: none     Maximum phonation time 33 sec     Perceptual Rating Of Vocal Quality:  CAPE-V  The following parameters of voice quality will be rated upon completion of the following tasks:  1. Sustained vowels 2. Sentence production 3. Spontaneous speech    Clinician's Perceptual Assessment using Consensus Auditory Perceptual Evaluation of Voice (CAPE-V)    Overall Severity  18/100  Constant  Roughness   18/100  Constant   Breathiness   18/100  Constant  Strain    24/100  Constant  Pitch    0/100  Constant  Nature of abnormality:  Loudness   0/100  Constant  Nature of abnormality:      Clinical Swallow Evaluation:  Oral Mechanism Examination: Normal symmetry and strength    Clinical Observations: Not tested    3 oz water swallow: not tested    Modified Functional Oral Intake Scale:  7 - Total oral intake with no restrictions           Videostroboscopic Findings:    Patient was then seen with: Dr. Freddi Starr, laryngologist, who performed flexible videostroboscopy which revealed the following:    MUCOSA:  R>L thinning    VOCAL FOLD MOVEMENT:  Right: normal  Left: sluggish     GLOTTIC CLOSURE: Complete with strain    VIBRATION:   Periodicity: periodic     Phase: asymmetrical  Mucosal Wave:  Right: present  Left: present   Amplitude:  Right: normal   Left: normal     MUSCLE TENSION PATTERN: Sphincteric (Type IV): moderate and severe  Supraglottis: Slight L pyriform sinus pooling  Subglottis: Not visualized   Tremor: No  Breaks: No  Observations with Pitch Change: Vocal folds lengthen appropriately with increased pitch.  Observations with Changes in Loudness: -    Observations with Treatment Probes: No probes administered     TREATMENT AND EDUCATION:    Education:  Plan/recommendations were reviewed with patient/caregiver.  The following were: Taught to patient/caregiver; voice    Contact information was provided for further questions.     Response: patient/caregiver verbalized understanding and may need further instruction    Skilled Services Provided: Beh. & qualitative analysis of voice & resonance    ASSESSMENT:  See clinical impression above.     Rehabilitation potential is: Good  Potential Barriers to achieve rehab goals: none  These goals were discussed and the patient agrees with them.    DISCHARGE GOALS:  1. Maintain best quality voice for functional communication and participation in community activities:   1a. Evaluate status of vocal folds  1b. Patient will participate in education of basic anatomy and physiology of vocal fold vibration  1d. Patient will express comprehension of good vocal hygiene   1e. Patient will express comprehension of reflux management  1g. During 2 minutes of spontaneous speech without cues, patient will demonstrate: easy onset/flow phonation, forward focus and coordinated phonation and respiration       Goals to be met by 07/04/17.    Patient/Family Input to Goals: Pt. wishes to: improve voice quality and know status of vocal folds    PLAN OF CARE:   Frequency/Duration of Treatment:  Patient will be seen up to 1 time per week for 3-5 visits over a period of 90 days.     This patient will recheck with the referring provider per MD recs.     The plan outlined above was formulated, reviewed, and agreed upon with the patient and/or family/caregiver.  Patient/caregiver verbalized understanding and restated information in plan.  Lacretia Nicks, CCC-SLP    Referring Physician/Practitioner Certification Statement:    I Certify the need for these services furnished under this plan of treatment and while under my care.

## 2017-04-05 NOTE — Telephone Encounter (Signed)
No open slot for f/u in May with Dr.Houlton/Dr.Bhrany. Refuse to see any provider. (She says just let Dr.Houlton know)/gaa

## 2017-04-09 ENCOUNTER — Other Ambulatory Visit (INDEPENDENT_AMBULATORY_CARE_PROVIDER_SITE_OTHER): Payer: Self-pay | Admitting: Family Medicine

## 2017-04-09 DIAGNOSIS — N761 Subacute and chronic vaginitis: Secondary | ICD-10-CM

## 2017-04-10 ENCOUNTER — Ambulatory Visit: Payer: Medicare HMO | Attending: Urology

## 2017-04-10 DIAGNOSIS — N9489 Other specified conditions associated with female genital organs and menstrual cycle: Secondary | ICD-10-CM | POA: Insufficient documentation

## 2017-04-10 DIAGNOSIS — R1903 Right lower quadrant abdominal swelling, mass and lump: Secondary | ICD-10-CM

## 2017-04-10 DIAGNOSIS — R140 Abdominal distension (gaseous): Secondary | ICD-10-CM

## 2017-04-10 MED ORDER — VAGIFEM 10 MCG VA TABS
10.0000 ug | ORAL_TABLET | VAGINAL | 1 refills | Status: DC
Start: 2017-04-11 — End: 2017-06-20

## 2017-04-10 NOTE — Telephone Encounter (Signed)
Hi Thyda,     Since it's new pt, Can you schedule this one?      Thanks

## 2017-04-11 NOTE — Telephone Encounter (Signed)
I called & LVM with the pt, that since she already saw Dr.Merati do not need to see Dr.Houlton any question call back/gaa

## 2017-04-11 NOTE — Telephone Encounter (Signed)
Spoke with the patient who has never been seen or referred to for Dr. Claudie ReveringHoulton or Dr. Christie NottinghamBhrany.    This TE must have been for a different patient.

## 2017-04-12 NOTE — Telephone Encounter (Signed)
I called & spoke & apologized with the pt. (closing the T.E.)/gaa

## 2017-04-12 NOTE — Addendum Note (Signed)
Addended by: Lanell Persons'OYLEY, Cullen Lahaie AJETTA on: 04/12/2017 09:01 AM     Modules accepted: Orders

## 2017-04-13 ENCOUNTER — Ambulatory Visit: Payer: Medicare HMO | Attending: Otolaryngology

## 2017-04-13 DIAGNOSIS — R1310 Dysphagia, unspecified: Secondary | ICD-10-CM | POA: Insufficient documentation

## 2017-04-13 DIAGNOSIS — R490 Dysphonia: Secondary | ICD-10-CM

## 2017-04-16 ENCOUNTER — Encounter (HOSPITAL_BASED_OUTPATIENT_CLINIC_OR_DEPARTMENT_OTHER): Payer: Self-pay | Admitting: Urology

## 2017-04-20 ENCOUNTER — Ambulatory Visit (HOSPITAL_BASED_OUTPATIENT_CLINIC_OR_DEPARTMENT_OTHER): Payer: Medicare HMO | Admitting: Rehabilitative and Restorative Service Providers"

## 2017-04-20 DIAGNOSIS — N8189 Other female genital prolapse: Secondary | ICD-10-CM

## 2017-04-20 DIAGNOSIS — N3946 Mixed incontinence: Secondary | ICD-10-CM

## 2017-04-20 NOTE — Progress Notes (Signed)
PHYSICAL THERAPY INITIAL EVALUATION (Medicare)    This note serves as a CMS evaluation form that requires the referring provider to certify the need for therapy services furnished under this Plan of Treatment. The signing provider is certifying the plan of care.    GENERAL VISIT INFO    Encounter Diagnoses   Name Primary?    Pelvic floor weakness Yes    Mixed incontinence      Referring Provider: Elon SpannerMiller, Jane Louise, MD  Referring Provider NPI: [id number]    VISIT COUNT   1    EVALUATION    Cassie Contreras presents to physical therapy with chief complaint of mixed urinary incontinence. She states the severity of the urinary incontinence is variable and she isn't exactly sure why. She has tried to be more aware of what makes her urinary incontinence worse (ex: she has noticed that fizzy water definitely does). She does have a complex pelvic health history, see referring provider's clinic note for more on her history. She has recently started detrol LA, which seems to be going well. She hasn't tried the [place] yet (forgot). She does strain to void sometimes. Urge urinary incontinence is variable, where as stress urinary incontinence seems to happen most with cough/sneeze, getting startled, or walking downhill. She is active with walking and weight training 2x/week, has stationary recumbent bike as well. She is on vagifem, no longer has pain with sexual activity. Does have anxiety/"get worked up about things." Doesn't do formal PELVIC FLOOR MUSCLE exercise.    General Assessment  Certification From*: 04/20/17  Certification To*: 07/19/17  Date of Symptom Onset*: 04/04/17  Start of Care Date*: 04/20/17  Reason for Referral*: mixed urinary incontinence  Interpreter Status: Not Needed    Prior Level of Function  ADLs: Independent with all ADLs  IADLs: Independent with all IADLs  Work/Education Life: Independent with all ADLs  Community/Leisure Activities: Independent with all ADLs    Past Medical History:   Diagnosis Date     Abdominal pain     Anxiety     Arthritis     Asthma     Constipation     Depression     GERD (gastroesophageal reflux disease)     Headache     Heart palpitations     History of irritable bowel syndrome     Hormone disorder     Metatarsal stress fracture of right foot     TX FROM PAPER CHART      Primary insomnia     Squamous cell carcinoma in situ of skin     Thyroid disease     UTI (urinary tract infection)        Past Surgical History:   Procedure Laterality Date    Tonsilectomy      Childhood    URETHRAL DILATION/DVIU         Social History     Social History Narrative    Not on file         ASSESSMENT    # voids/day: 7-8  # voids/night: 1  Urge: can delay 1-2 minutes  Protective pads used per day: 2-4  Types of protective pads used: thin and medium levels of protection  Fluid intake: 1-1.5 liters/day  Bladder irritants: yes  # bowel movements: 2/week  The patient is reporting 2-5/10 level of perceived severity of symptoms, on a 0-10 Numeric Distress Scale.    Objective:  Observation: appears well.    Impairments:       Range of  motion/Quality of motion/Recruitment Pattern: fair-good / good / good        Muscle Strength: [place]: 2 / 10 / did not test /7 (MMT/Hold in seconds/repetitions at hold/quick)        Sensation: normal   Cotton Swab dnt.         Cough reflex: present   Anal wink reflex: dnt   Palpation:no pain externally, did have a burning feeling with stretch on bulbospongiosus internally on R only, normal tone throughout   Prolapse/Tissue Laxity: anterior wall laxity diagnosed by Dr. Hyacinth Meeker   Abdominal exam: deferred to future visit   Breathing pattern: deferred to future visit   Lower Quadrant Screen: deferred to future visit    Talecia presents with chief complaint of mixed urinary incontinence. She does have other related pelvic health impairments, such as IBS and vaginal atrophy. Exam notable for pelvic floor muscle weakness but good coordination primarily. No red flags.      TREATMENT AND PLAN    Education  Primary Learner: Patient  Preferred Language: English  Education Preference: Verbal;Reading/Handout;Pictures/Handout  Method of Education: Verbal;Handout  Response to Education: Demonstrates independently;States understanding  Barriers to Education: None  Cultural practices that influence care: None    Therapy Goals  Patient Goals: improve urinary continence by 75% or better  Potential Barriers to Achieving Goals: Resources;Chronicity or Severity;Comorbidities;Impairment    Discharge Plan  Planned Intervention(s)*: Patient Education;Manual Therapy;Therapeutic Excerise;Therapeutic Activities;Neuromuscular Re-education;Gait Training;Biofeedback Training  Prognosis: Fair  Therapy Frequency: 1/wk(reducing frequency as patient progresses)  Anticipated Discharge Date: 07/19/17    Interventions/Treatment provided:  Evaluations and Re-Evalulations  PHYSICAL THERAPY EVALUATION MOD COMPLEX 97162: 1 Procedure        Therapeutic Procedures  NEUROMUSC REED 1 OR >1 AREAS 97112 - Minutes: 15        Access Code: [email or web address]   URL: [email or web address]   Date: 04/20/2017   Prepared by: Larkin Ina     Program Notes   Double voiding: first, relax, no rush, no push. Then experiment with position to see if anything helps improve the flow. Then stand up/sit down, and repeat 1x.   3 days of bladder diary.     Exercises  Kegels - 5x weekly: can also do lying on your side with knees bent up.  1) quick flicks: 5 reps, take a break, and repeat. 3-4 sets total.   2) long holds: hold for 10 seconds, relax for 10 seconds, 8-12 reps, 2 sets/day.     Patient Education: verbal instructions and demonstrations were provided for the above exercises  a handout was provided describing the exercises in written and picture format  the patient was able to return demonstrate the exercises accurately after instructions.     Interventions for next therapy session:  Test pelvic floor muscle function in upright  postures, follow up on home program and adjust as needed. LQ screen as needed. Review bladder diary, consider bladder retraining or urge suppression. Follow up on effect of attempts at double voiding.     Mattisyn Cardona L

## 2017-04-20 NOTE — Patient Instructions (Signed)
Access Code: [email or web address]   URL: [email or web address]   Date: 04/20/2017   Prepared by: Larkin InaNatasha Reef Achterberg     Program Notes   Double voiding: first, relax, no rush, no push. Then experiment with position to see if anything helps improve the flow. Then stand up/sit down, and repeat 1x.   3 days of bladder diary.     Exercises  Kegels - 5x weekly: can also do lying on your side with knees bent up.  1) quick flicks: 5 reps, take a break, and repeat. 3-4 sets total.   2) long holds: hold for 10 seconds, relax for 10 seconds, 8-12 reps, 2 sets/day.

## 2017-04-24 ENCOUNTER — Ambulatory Visit (INDEPENDENT_AMBULATORY_CARE_PROVIDER_SITE_OTHER): Payer: Medicare HMO | Admitting: Family

## 2017-04-24 ENCOUNTER — Ambulatory Visit (HOSPITAL_BASED_OUTPATIENT_CLINIC_OR_DEPARTMENT_OTHER): Payer: Medicare HMO | Admitting: Rehabilitative and Restorative Service Providers"

## 2017-04-24 ENCOUNTER — Telehealth (HOSPITAL_BASED_OUTPATIENT_CLINIC_OR_DEPARTMENT_OTHER): Payer: Self-pay | Admitting: Rehabilitative and Restorative Service Providers"

## 2017-04-24 VITALS — BP 109/70 | HR 57 | Temp 98.1°F | Resp 14 | Wt 151.0 lb

## 2017-04-24 DIAGNOSIS — N3001 Acute cystitis with hematuria: Secondary | ICD-10-CM

## 2017-04-24 DIAGNOSIS — R310 Gross hematuria: Secondary | ICD-10-CM

## 2017-04-24 LAB — PR U/A AUTO DIPSTICK ONLY, ONSITE
Bilirubin, Urine: NEGATIVE
Glucose, Urine: NEGATIVE mg/dL
Ketones, URN: NEGATIVE mg/dL
Nitrite, URN: NEGATIVE
Protein: NEGATIVE mg/dL
Specific Gravity, Urine: 1.005 (ref 1.005–1.030)
Urobilinogen, URN: 0.2 E.U./dL (ref 0.2–1.0)
pH, URN: 5.5 (ref 5.0–8.0)

## 2017-04-24 MED ORDER — NITROFURANTOIN MONOHYD MACRO 100 MG OR CAPS
100.0000 mg | ORAL_CAPSULE | Freq: Two times a day (BID) | ORAL | 0 refills | Status: DC
Start: 2017-04-24 — End: 2020-12-08

## 2017-04-24 NOTE — Telephone Encounter (Signed)
Returned patient call. Discussed current symptoms and agreed to cancel today's visit. She does state no questions or concerns regarding home program.

## 2017-04-24 NOTE — Telephone Encounter (Signed)
(  TEXTING IS AN OPTION FOR [facility] CL[facility]ICS ONLY)  Is this a [facility] clinic? No      RETURN CALL: Detailed message on voicemail only      SUBJECT:  General Message     REASON FOR REQUEST: Bladder infection    MESSAGE: Patient says she woke up with a severe bladder infection and is urinating blood. She wants to know if she should still come in for her appointment today.

## 2017-04-24 NOTE — Progress Notes (Signed)
Patient Referred By: No ref. provider found  Patient's PCP: Burns SpainHei, Thomas Kou, MD     Subjective:  Patient is a 67 year old female, here to discuss Bladder Problems (infection )    The following portions of the patient's history were reviewed with the patient and updated as appropriate: problem list, current medications, allergies and past medical history.    HPI    Cassie Contreras is a 67 year old woman with chronic history of urinary tract infections, pelvic floor dysfunction. She has been doing pelvic floor.     She has had 48 hours of dysuria, urinary urgency and frequency and this morning she had some pelvic pressure, along with hematuria when she wiped.  She notes at the end of micturition is more painful. She feels a little tired, but no nausea or fevers.     Review of Systems   Constitutional: Positive for fatigue. Negative for chills and fever.   Cardiovascular: Negative for chest pain (.vs).   Gastrointestinal: Negative for abdominal pain, nausea and vomiting.   Genitourinary: Positive for dysuria, frequency, hematuria and urgency. Negative for difficulty urinating and flank pain.     BP 109/70    Pulse 57    Temp 98.1 F (36.7 C) (Temporal)    Resp 14    Wt 151 lb (68.5 kg)    SpO2 98%    BMI 22.96 kg/m         Objective:  Physical Exam  WNWD female in NAD.   Head is normocephalic.   Skin is warm and moist.   No CVAT.   +BT in all abdominal quadrants. Some pelvic pressure/pain with palpation.    Assessment and Plan:   Diagnoses and all orders for this visit:    Acute cystitis with hematuria  -     U/A AUTO DIPSTICK ONLY, ONSITE  -     Urine Culture  -     Nitrofurantoin Monohyd Macro 100 MG Oral Cap; Take 1 capsule (100 mg) by mouth every 12 hours for 7 days.  UA indicative of 1+ blood and trace leukocytes.  Urine culture ordered to confirm UTI.  Recommended hydration, okay to use Pyridium if needed.  Recommend repeating UA in 2 weeks to make sure the blood has cleared from urine sample.  Gross hematuria  -      Urinalysis Complete; Future

## 2017-04-26 ENCOUNTER — Encounter (INDEPENDENT_AMBULATORY_CARE_PROVIDER_SITE_OTHER): Payer: Self-pay | Admitting: Family

## 2017-04-26 DIAGNOSIS — B373 Candidiasis of vulva and vagina: Secondary | ICD-10-CM

## 2017-04-26 DIAGNOSIS — B3731 Acute candidiasis of vulva and vagina: Secondary | ICD-10-CM

## 2017-04-26 MED ORDER — FLUCONAZOLE 150 MG OR TABS
150.0000 mg | ORAL_TABLET | Freq: Once | ORAL | 0 refills | Status: AC
Start: 2017-04-26 — End: 2017-04-26

## 2017-04-26 NOTE — Telephone Encounter (Signed)
Patient called through [facility]. Informed patient message has been forwarded to covering provider and will be addressed once done with patients.

## 2017-04-26 NOTE — Telephone Encounter (Signed)
Patient has been taking antibiotic (macrobid) and has yeast infection. Please advise med pending. Thanks.

## 2017-04-27 LAB — URINE C/S: Culture: >100000

## 2017-04-30 ENCOUNTER — Encounter (INDEPENDENT_AMBULATORY_CARE_PROVIDER_SITE_OTHER): Payer: Self-pay | Admitting: Family Medicine

## 2017-04-30 ENCOUNTER — Telehealth (HOSPITAL_BASED_OUTPATIENT_CLINIC_OR_DEPARTMENT_OTHER): Payer: Self-pay | Admitting: Urology

## 2017-04-30 DIAGNOSIS — N390 Urinary tract infection, site not specified: Secondary | ICD-10-CM

## 2017-04-30 DIAGNOSIS — R319 Hematuria, unspecified: Secondary | ICD-10-CM

## 2017-04-30 NOTE — Telephone Encounter (Signed)
pending

## 2017-04-30 NOTE — Telephone Encounter (Signed)
Pt scheduled next available appt with Dr Hyacinth MeekerMiller, 6/7. Should/could she be seen sooner? Please advise.

## 2017-05-02 ENCOUNTER — Ambulatory Visit: Payer: Medicare HMO | Attending: Otolaryngology | Admitting: Speech-Language Pathologist

## 2017-05-02 DIAGNOSIS — N3946 Mixed incontinence: Secondary | ICD-10-CM | POA: Insufficient documentation

## 2017-05-02 DIAGNOSIS — R490 Dysphonia: Secondary | ICD-10-CM | POA: Insufficient documentation

## 2017-05-02 DIAGNOSIS — N8189 Other female genital prolapse: Secondary | ICD-10-CM | POA: Insufficient documentation

## 2017-05-02 NOTE — Telephone Encounter (Signed)
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     We discussed that stress incontinence is related to a weak outlet, which can be treated with a sling as recommended by Dr. Daisy FloroKobashi in the past as well as PFM strengthening, devices, medications, bulking agents. She has been treated with Detrol in the past with benefit and I explained this is usually prescribed for urge incontinence. Given her symptoms, we discussed that she may have a mixture of stress and urge incontinence, as well as spontaneous leakage. However, treatment for urinary incontinence is a quality of life issue and will be dependent on what treatments she is amenable with. She would like to defer any surgery for now, as she is worried about complications. Although her new intermittent abdominal bloating and RLQ mass/discomfort is likely her bowels-will proceed with pelvic ultrasound to further evaluate ovaries.    -  Continue vaginal estrace cream   -  Restart Detrol  LA 4mg  PRN  -  Start pelvic floor exercises at physical therapy   -  Consider uroflow study for next appointment as she does not need to void during today's visit   -   Pelvic ultrasound  -  RTC in 3-4 months or sooner prn     Patient stating that Dr. Hyacinth MeekerMiller advised her that she would need Urodynamics study and requests that we verify with Dr. Hyacinth MeekerMiller if this is the case.      Routing to Dr. Hyacinth MeekerMiller to advise

## 2017-05-02 NOTE — Progress Notes (Signed)
SPEECH LANGUAGE PATHOLOGY/LARYNGOLOGY   Progress note     The following patient identifiers were confirmed today: name and date of birth.    Time in: 1350 Duration: 60 min   General Assessment  Certification From*: 93/81/82  Certification To*: 99/37/16  Date of Symptom Onset*: 04/05/17  Start of Care Date*: 04/05/17    Visits from start of care: 2  Referring Provider: Dr. Freddi Starr  Treatment/Therapy Diagnosis:   Problem List Items Addressed This Visit     Dysphonia - Primary          CLINICAL IMPRESSION:  Pt. is a 67 year old female with  mildly breathy voice quality with impression of increased pitch and posterior tone focus.  Occasional throat clear. This is in the setting of R>L TVF bowing, L TVF sluggishness, and likely incomplete glottic closure (though glottic closure was complete with excessive strain).  Throat clearing may be related to mild esophageal dysmotility, although GI study did not cover UES, so that may be an issue as well.  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  We will work on voice optimization in voice therapy to see if she can get a satisfactory result.  She will then follow up in 3-4 months in clinic and may consider injection augmentation at that time if indicated.     PERTINENT MEDICAL/SURGICAL HISTORY:   Past Medical History:   Diagnosis Date    Abdominal pain     Anxiety     Arthritis     Asthma     Constipation     Depression     GERD (gastroesophageal reflux disease)     Headache     Heart palpitations     History of irritable bowel syndrome     Hormone disorder     Metatarsal stress fracture of right foot     TX FROM PAPER CHART      Primary insomnia     Squamous cell carcinoma in situ of skin     Thyroid disease     UTI (urinary tract infection)        Past Surgical History:   Procedure Laterality Date    Tonsilectomy      Childhood    URETHRAL DILATION/DVIU         HISTORY:  Patient is a 67 year old female with chief complaint of unpredictable hoarseness and  intermittent solid food dysphagia for several years. 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 She noticed some unpredictability of her voice for the past 5 years. SHe reported that her voice fluctuates daily, she's been told her voice is soft, her voice gets hoarse easily, and she feels like she strains to talk. She endorsed vocal fatigue. She feels her voice is worse with stress. Her voice is worst in the morning or after she hasn't spoken in a while. She did participate in voice therapy 3-4 years ago and uses straw phonation as a warm up and uses nasal consonants to bring her voice up "into her mask".     She also reported intermittent solid food dysphagia. When she swallows certain dry dense foods (like chicken) she sometimes feels them sticking in her mid-lower chest. If she tries to take a drink when this happens, it doesn't go down and she regurgitates the water back up. This occurs 2-3 times per month.  The frequency of incidents increased after she took antibiotics for bronchitis this past summer. She also reported that eating popcorn can make her cough. Pills can also get stuck in the same place but not as severely. She denied difficulty swallowing water. She eats a general diet, no unintentional weight loss, no pneumonia. She does report a history of reflux for which she has been treated off and on over the years. She is not taking any medication now (last time she was treated was ~5 years ago). She does feel heartburn ~2-3 times per week but she ignores it.    She denied dyspnea. She has a remote smoking history (smoked socially for 5-10 years over 40 years ago). She drinks 2-3 cups of coffee (half decaf), drinks ~2 alcoholic beverages per week, and adequate water intake.    Esophagram results per radiology note:  IMPRESSION:  Mild esophageal dysmotility, otherwise  normal esophagram.    Prior Level of Function: Before the onset of the current condition, the patient was able to speak and swallow normally.    SUBJECTIVE:   Pt. was alert and ready to participate     Patient Statement: Patient reported that she has been taking omeprazole once a day at bedtime for the last 3 weeks.  She has not noticed any change to her voice, however, she has noticed a decrease in reflux symptoms, which occur 1 time per week rather than 2-3 times per week.  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 She also speaks during liturgy for up to half an hour at a time.  She has worked with straw phonation and forward focus on nasal consonants in the past.  She continues to try to use this techniques.  She endorses vocal fatigue and strain after about 2 services.  Services can have up to 350 people.  She is amplified.  Her voice worsens with stress.  She thinks that her pitch is the same as before.  When she was doing voice therapy in the past, she found it difficult to use techniques and speak at the same time.  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    Barriers to learning: none  Preferred Learning style: demonstration, written and verbal  Cultural Practices that Influence Care: none    The patient is reporting 0/10 level of pain now, on a 0-10 Numeric Pain Distress Scale. Worst level of pain in the past 24 hours: 0/10.    TREATMENT & EDUCATION:    SLP Skilled Services Provided: Speech Language Voice Treatment CPT [phone]    OBJECTIVE: Patient presents today with mildly breathy voice quality with impression of increased pitch and posterior tone focus.  Occasional throat clear.    Treatment focused on:  Vocal Anatomy and Physiology: Educated patient on the basics of vocal anatomy and the subsystems used for voicing, with emphasis on the balance needed between respiration, phonation and  resonance for optimal vocal efficiency. Reviewed laryngeal videostroboscopy with patient.  I was curious about the possibility of right below left vocal fold vertical height mismatch in addition to the left vocal fold sluggishness and bilateral bowing.      Oropharyngeal and Laryngeal Hygiene: Extensive education regarding vocal hygiene provided to the patient including identification vocal misuse/abuse. Pt was provided strategies to eliminate, decrease, or replace these behaviors.     Reflux: Provided patient with "Silent Reflux" handout and behavioral precautions regarding an  anti-reflux regimen. Encouraged patient to avoid dietary triggers for reflux, elevate head of bed, avoid late night eating, etc. Specific dietary recommendations provided. Encouraged patient to continue medical management of reflux as recommended by MD.     Cough/Throat Clear Reduction Strategies: Educated patient in strategies to reduce the frequency of throat clearing/coughing episodes. To help break this cycle, patient was encouraged to replace the throat clear/cough with another behavior (sip of water, air cough, and/or gentle hum). Pt also instructed in quick release breathing (specifically using nasal inhalation followed by pursed lip exhalation) to prevent or interrupt cough.     Forward Focus: Patient was instructed in forward, oral tone focus for producing and projecting voice without strain. Techniques used today included: Nasal consonants, Twang, Y-Buzz and +Y-Buzz.  Using forward focus, patient's voice quality was significantly improved.  Patient was able to demonstrate at sentence level with moderate cues.  Cues were provided to continue to allow for breath flow.  Patient also required cues to allow increased oral excursion and avoid strain. Patient was provided with handouts for Neva Seat, +Y-buzz and twang.  I encouraged her to play with these concepts at home and to stop using nasal consonants, as they were not effective for  her.    Loudness: Volume was subjectively decreased.  Patient to increase volume during trials of forward focus.  She was able to do so without significant strain.    Pitch, Auditory Feedback and Biofeedback: Were provided using Sona Speech II computer software. Patient was able to differentiate between vocal misuse and target voice. Habitual pitch was measured at 206 Hz. Target pitch during treatment was 207Hz . Voice quality was good using appropriate pitch.  Forward focus was a bigger issue than pitch.    Next session: Continue with forward focus and increase level of difficulty.  Mock sermon.  Flow phonation.     TREATMENT AND EDUCATION:    Education:  Plan/recommendations were reviewed with patient/caregiver.  The following were: Taught to patient/caregiver; voice    Contact information was provided for further questions.     Response: patient/caregiver verbalized understanding and may need further instruction    Skilled Services Provided: Speech language voice treatment    ASSESSMENT:  See clinical impression above.     Rehabilitation potential is: Good  Potential Barriers to achieve rehab goals: none  These goals were discussed and the patient agrees with them.    DISCHARGE GOALS:  1. Maintain best quality voice for functional communication and participation in community activities:   1a. Evaluate status of vocal folds  1b. Patient will participate in education of basic anatomy and physiology of vocal fold vibration  1d. Patient will express comprehension of good vocal hygiene   1e. Patient will express comprehension of reflux management  1g. During 2 minutes of spontaneous speech without cues, patient will demonstrate: easy onset/flow phonation, forward focus and coordinated phonation and respiration     Goals to be met by 07/04/17.    Patient/Family Input to Goals: Pt. wishes to: improve voice quality and know status of vocal folds    PLAN OF CARE:   Frequency/Duration of Treatment: Patient will be seen up  to 1 time per week for 3-5 visits over a period of 90 days.     This patient will recheck with the referring provider per MD recs.     The plan outlined above was formulated, reviewed, and agreed upon with the patient and/or family/caregiver.  Patient/caregiver verbalized understanding and restated information in plan.  Deland Pretty, MS, [facility]  Voicemail: [phone]  Pager: [phone]      Referring Physician/Practitioner Certification Statement:    I Certify the need for these services furnished under this plan of treatment and while under my care.

## 2017-05-03 ENCOUNTER — Telehealth (HOSPITAL_BASED_OUTPATIENT_CLINIC_OR_DEPARTMENT_OTHER): Payer: Self-pay | Admitting: Speech-Language Pathologist

## 2017-05-03 NOTE — Telephone Encounter (Signed)
(  TEXTING IS AN OPTION FOR [facility] CLINICS ONLY)  Is this a [facility] clinic? No      RETURN CALL: OK to leave detailed message with anyone that answers      SUBJECT:  General Message     REASON FOR REQUEST: [facility] states that they are waiting for clinic notes to be faxed to [phone] [facility]#[id number]#[phone]     MESSAGE: please contact

## 2017-05-04 ENCOUNTER — Ambulatory Visit (HOSPITAL_BASED_OUTPATIENT_CLINIC_OR_DEPARTMENT_OTHER): Payer: Medicare HMO | Admitting: Rehabilitative and Restorative Service Providers"

## 2017-05-04 DIAGNOSIS — N8189 Other female genital prolapse: Secondary | ICD-10-CM

## 2017-05-04 DIAGNOSIS — N3946 Mixed incontinence: Secondary | ICD-10-CM

## 2017-05-04 NOTE — Progress Notes (Signed)
PHYSICAL THERAPY DAILY NOTE (Medicare)      GENERAL VISIT INFO    Encounter Diagnoses   Name Primary?    Mixed incontinence     Pelvic floor weakness      Referring Provider: Elon SpannerMiller, Jane Louise, MD  Referring Provider NPI: [id number]    VISIT COUNT   2    EVALUATION     Cassie Contreras confirms history noted upon chart review today (that she developed a UTI, then a yeast infection, has been taking medication for both, and both have appeared to resolve). She feels like she is back at her baseline from eval. She did do a bladder diary. She identified some trends: caffeine does seem to be an irritant, frequency is worse when up and about vs sitting around at home, she tends to get most of her fluid in afternoon and evening. Her voiding intervals were varied--sometimes as often as 45 minutes and other times almost 5 hours between voids. She did find the double voiding to be effective for a majority of voids, and this helped with the leakage after voiding. She states the home program was appropriately challenging.        Past Medical History:   Diagnosis Date    Abdominal pain     Anxiety     Arthritis     Asthma     Constipation     Depression     GERD (gastroesophageal reflux disease)     Headache     Heart palpitations     History of irritable bowel syndrome     Hormone disorder     Metatarsal stress fracture of right foot     TX FROM PAPER CHART      Primary insomnia     Squamous cell carcinoma in situ of skin     Thyroid disease     UTI (urinary tract infection)        Past Surgical History:   Procedure Laterality Date    Tonsilectomy      Childhood    URETHRAL DILATION/DVIU         Social History     Social History Narrative    Not on file         ASSESSMENT    Cassie Contreras was able to perform pelvic floor muscle exercises in sitting and standing today. No symptoms. We discussed urge suppression and she states understanding. She is thinking about cutting down on caffeine intake and trying to get fluid more  consistently throughout the day.     TREATMENT AND PLAN    Patient Goals: improve urinary continence by 75% or better  Anticipated Discharge Date: 07/19/17    Interventions/Treatment provided:     Neuromuscular re-education:  -pelvic floor muscle training practice   -hooklying, sitting, standing   -long holds and quick flicks (full sets of each)  -patient education on urge suppression techniques  -review bladder diary    HOME PROGRAM:  Cont. your PFM exercises, but in a sitting position.  Consider how to get fluid gradually throughout the course of the day.     Access Code: [email or web address]        URL: [email or web address]        Date: 04/20/2017   Prepared by: Cassie Contreras     Program Notes   Double voiding    Exercises  Kegels - 5x weekly: can also do lying on your side with knees bent up.  1) quick flicks: 5 reps, take a break, and repeat.  3-4 sets total.   2) long holds: hold for 10 seconds, relax for 10 seconds, 8-12 reps, 2 sets/day.     Patient Education: verbal instructions and demonstrations were provided for the above exercises  a handout was provided describing the exercises in written and picture format  the exercises that were provided last session were reviewed with the patient today  the patient was able to return demonstrate the exercises accurately after instructions.     Interventions for next therapy session:  Follow up with how urge suppression went, any changes to fluid intake and effect, adjust home program as needed if too hard or easy.    Cassie Contreras L

## 2017-05-04 NOTE — Patient Instructions (Signed)
Cont. your PFM exercises, but in a sitting position.  Consider how to get fluid gradually throughout the course of the day.

## 2017-05-07 ENCOUNTER — Ambulatory Visit: Payer: Medicare HMO | Admitting: "Endocrinology

## 2017-05-07 ENCOUNTER — Encounter (INDEPENDENT_AMBULATORY_CARE_PROVIDER_SITE_OTHER): Payer: Self-pay | Admitting: "Endocrinology

## 2017-05-07 ENCOUNTER — Telehealth (INDEPENDENT_AMBULATORY_CARE_PROVIDER_SITE_OTHER): Payer: Self-pay | Admitting: "Endocrinology

## 2017-05-07 VITALS — BP 105/57 | HR 51 | Ht 68.25 in | Wt 152.0 lb

## 2017-05-07 DIAGNOSIS — E041 Nontoxic single thyroid nodule: Secondary | ICD-10-CM

## 2017-05-07 NOTE — Telephone Encounter (Signed)
FYI, medical records request was faxed to [facility]. Requested Thyroid Biopsy Op note done by Dr. Anner CreteAhmadian Mandana in 2015. If records come in, please scan to pt's chart. Requested to have records faxed to [facility], [phone] Thank you.

## 2017-05-07 NOTE — Patient Instructions (Signed)
Annual screening for thyroid function tests.  No need for further Ultrasound of the neck unless you have tenderness in the neck or notice change in size or difficulty breathing when you lie flat.

## 2017-05-07 NOTE — Progress Notes (Signed)
Referred by Dr. Clemmie Krill    REASON FOR REFERRAL:  Followup of solitary thyroid nodule.    HISTORY OF PRESENT ILLNESS:  It was a pleasure to meet this delightful 67 year old lady for evaluation and followup of thyroid nodule.  She was noted to have a thyroid nodule on a CT scan in 2015 that appeared to be at 3.1 x 2.2 x 3.9 cm in the isthumus and the medial left lobe of the thyroid gland .  The CT scan was done as a part of workup for dysphonia.  Following that, she was seen by Dr. Gerre Pebbles at [facility]  endocrine consults.  She had a FNA done in 2015 that was reported as benign.  I do not have the raw FNA pathology report, but Dr. Judeth Cornfield notes suggest that the FNA was benign and she has been followed up with office ultrasounds at least two or three times in 71-month interval since then .     Subsequently seen at [facility]  thyroid nodule clinic on 07/10/2016.  At that point, the office ultrasound  noted isoechoic  heterogeneous thyroid nodule measuring 3.5 cm without any suspicious features such as microcalcifications/taller than wider/irregular borders.  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      Elisia  had a repeat ultrasound done in 03/2017 this time by Radiology.  The thyroid nodule was again noted to be cystic, solid with regular smooth margins and no microcalcifications.  The size was noted at 3.7 x 3.2 x 1.5 cm.  I reviewed these images, but could not compare it with the office ultrasound images.    Stanislawa does not report of any tenderness in the neck.  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   She had a recent esophagogram that revealed mild esophageal dysmotility with no obstruction from the thyroid nodule.  She reports of no stridor. Remote history at age 34 of being on levothyroxine replacement, but has not needed  levothyroxine replacement during pregnancy or the last 45 years. Thyroid functions continue to be completely normal with  last TSH on 03/23/2017 at 1.1 with normal free T4.    The new symptom that she reports now is difficulty to sleep and waking up in the middle of the night, unable to sleep after that.  To note, she has also been phased off appropriately from hormone replacement therapy during this time.     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 Apply nightly to upper eyelid at base of eyelashes with applicator. Blot excess solution. Repeat for opposite eyelid using new applicator.      ClonazePAM 0.5 MG Oral Tab Take 1 tablet (0.5 mg) by mouth daily as needed (anxiety). 20 tablet 0    Ibuprofen-Diphenhydramine Cit (ADVIL PM OR) Take 200 mg by mouth.      Metoprolol Succinate ER 25 MG Oral TABLET SR 24 HR Take 1 tablet (25 mg) by mouth daily. 90 tablet 3    Omeprazole 40 MG Oral CAPSULE DELAYED RELEASE Take 1 capsule (40 mg) by mouth daily on an empty stomach. 30 capsule 3    Phenazopyridine HCl (PYRIDIUM) 200 MG Oral Tab Take 1 tablet (200 mg) by mouth 2 times a day after  meals. For urinary discomfort. 10 tablet 0    Tamsulosin HCl (FLOMAX) 0.4 MG Oral Cap Take 1 capsule (0.4 mg) by mouth at bedtime. 90 capsule 3    Tolterodine Tartrate ER 4 MG Oral CAPSULE SR 24 HR Take one tablet daily as needed for incontinence 30 capsule 3    Tretinoin 0.05 % External Cream Apply to affected area on face at bedtime. 20 g 2    VAGIFEM 10 MCG Vaginal Tab Place 1 tablet (10 mcg) into the vagina 3 times a week. 36 tablet 1     No current facility-administered medications for this visit.          ALLERGIES:  Review of patient's allergies indicates:  Allergies   Allergen Reactions    Erythromycin Other     Strange smells but no rash or trouble breathing.    Ibuprofen      TX FROM PAPER CHART      Sulfa Antibiotics      TX FROM PAPER CHART           PERSONAL HISTORY:  Cigarettes, half a pack ex-smoker, smoked half a pack for 10 years.  Occasional wine.  No marijuana.  Lives with her husband.    FAMILY HISTORY:  Dad had benign multinodular goiter, died from brain tumor.  Cousin and aunt had Hashimoto thyroiditis.  No family history of thyroid cancer.  Sister had coronary artery disease at age 21.  Her daughter has not encountered any thyroid issues.    REVIEW OF SYSTEMS:  Remainder of a complete review of systems is negative, except as noted in the HPI.    PHYSICAL EXAMINATION     VITAL SIGNS:     BP 105/57    Pulse 51    Ht 5' 8.25" (1.734 m)    Wt 152 lb (68.9 kg)    SpO2 100%    BMI 22.94 kg/m     General : Alert and oriented, in no acute distress, pleasant affect  HEENT:  Pupils equally reacting to light.  Extraocular muscle movements intact.anicteric sclera, oral mucous membrane moist  NECK: Neck revealed palpable thyroid nodule about 3 cm in the midline, nontender, moves.  No cervical lymph nodes appreciated.    CARDIOVASCULAR:  S1, S2 heard.  No murmurs, rubs, or gallops.  CHEST:  Clear to auscultation.  ABDOMEN:  Soft, nontender, No organomegaly, bowl sounds  present  EXTREMITIES:peripheral edema: absent   No focal neurological deficit      ASSESSMENT AND PLAN:   67 year old lady with a solitary thyroid nodule  presents for followup.  The nodule was biopsied with FNA  in 2015 found  Benign. Subsequent ultrasounds have shown stable appearance without suspicious features of microcalcification or irregular borders.  She has mildly elevated TPO antibodies with Hashimoto thyroiditis.  The ultrasound in appearance in Hashimoto thyroiditis typically shows a lot of pseudonodules.     I reviewed the most recent ultrasound along with her and discussed that the small changes seen in the size of the nodule could also be interobservational  variability or between different ultrasound machines.  Overall, there are no suspicious features for this nodule, which has been followed up for the last four years.  We will try our best to obtain the pathology results, but as they have been reported as benign, will not repeat the FNA further.  I agree with prior assessment that she does not need further ultrasounds unless she develops tenderness in the neck or notices a change in size and/or has stridor.  She will, however, need annual followup of  thyroid function tests as she has a higher risk of developing hypothyroidism due to positive TPO antibodies.  She does not need followup with testing of TPO antibodies as they fluctuate through years.      No followup made.  Open appointment.    .  Thank you for the opportunity to participate in the care of this patient.  Please feel free to contact us with any questions.    Rubin PayorSubbulaxmi Sanvika Cuttino MD, MRCP, MMSc  Clinical Assistant Professor  [facility]  Diabetes and Endocrine clinic  [address] [place]  [place], [place]  [id number]  ph: [phone]  email: [email or web address]

## 2017-05-08 NOTE — Telephone Encounter (Signed)
Noted.     Thank you    Ludger NuttingVeronica A Tex Conroy, RN  [facility]

## 2017-05-09 ENCOUNTER — Other Ambulatory Visit (INDEPENDENT_AMBULATORY_CARE_PROVIDER_SITE_OTHER): Payer: Medicare HMO

## 2017-05-09 DIAGNOSIS — R310 Gross hematuria: Secondary | ICD-10-CM

## 2017-05-09 DIAGNOSIS — N390 Urinary tract infection, site not specified: Secondary | ICD-10-CM

## 2017-05-09 DIAGNOSIS — R319 Hematuria, unspecified: Secondary | ICD-10-CM

## 2017-05-09 LAB — PR U/A AUTO DIPSTICK ONLY, ONSITE
Bilirubin, Urine: NEGATIVE
Glucose, Urine: NEGATIVE mg/dL
Ketones, URN: NEGATIVE mg/dL
Leukocytes: NEGATIVE
Nitrite, URN: NEGATIVE
Occult Blood, URN: NEGATIVE
Protein: NEGATIVE mg/dL
Specific Gravity, Urine: 1.015 (ref 1.005–1.030)
Urobilinogen, URN: 0.2 E.U./dL (ref 0.2–1.0)
pH, URN: 7 (ref 5.0–8.0)

## 2017-05-09 LAB — URINALYSIS COMPLETE, URN
Bacteria, URN: NONE SEEN
Bilirubin (Qual), URN: NEGATIVE
Epith Cells_Renal/Trans,URN: NEGATIVE /HPF
Epith Cells_Squamous, URN: NEGATIVE /LPF
Glucose Qual, URN: NEGATIVE mg/dL
Ketones, URN: NEGATIVE mg/dL
Leukocyte Esterase, URN: NEGATIVE
Nitrite, URN: NEGATIVE
Occult Blood, URN: NEGATIVE
Protein (Alb Semiquant), URN: NEGATIVE mg/dL
RBC, URN: NEGATIVE /HPF
Specific Gravity, URN: 1.013 g/mL (ref 1.006–1.027)
WBC, URN: NEGATIVE /HPF
pH, URN: 7 (ref 5.0–8.0)

## 2017-05-09 NOTE — Progress Notes (Signed)
Patient left urine sample. SCB

## 2017-05-10 LAB — URINE C/S: Culture: >100000

## 2017-05-11 ENCOUNTER — Ambulatory Visit (HOSPITAL_BASED_OUTPATIENT_CLINIC_OR_DEPARTMENT_OTHER): Payer: Medicare HMO | Admitting: Rehabilitative and Restorative Service Providers"

## 2017-05-11 DIAGNOSIS — N8189 Other female genital prolapse: Secondary | ICD-10-CM

## 2017-05-11 DIAGNOSIS — N3946 Mixed incontinence: Secondary | ICD-10-CM

## 2017-05-11 NOTE — Progress Notes (Signed)
PHYSICAL THERAPY DAILY NOTE (Medicare)      GENERAL VISIT INFO    Encounter Diagnoses   Name Primary?    Mixed incontinence     Pelvic floor weakness      Referring Provider: Elon SpannerMiller, Jane Louise, MD  Referring Provider NPI: [id number]    VISIT COUNT   3    EVALUATION     Carolina SinkRita reports doing well overall since last visit. She is no longer requiring a double void for most voids. She is feeling positive about the urge suppression / bladder retraining / minimizing bladder irritation concepts from last visit. She tried to find the impressa in stores but couldn't. Home program felt good : pelvic floor muscle uptraining.     Past Medical History:   Diagnosis Date    Abdominal pain     Anxiety     Arthritis     Asthma     Constipation     Depression     GERD (gastroesophageal reflux disease)     Headache     Heart palpitations     History of irritable bowel syndrome     Hormone disorder     Metatarsal stress fracture of right foot     TX FROM PAPER CHART      Primary insomnia     Squamous cell carcinoma in situ of skin     Thyroid disease     UTI (urinary tract infection)        Past Surgical History:   Procedure Laterality Date    Tonsilectomy      Childhood    URETHRAL DILATION/DVIU         Social History     Social History Narrative    Not on file         ASSESSMENT    Carolina SinkRita is progressing as expected. Provided with positional change info for home program today, as well as what to do re: pelvic floor muscle training if/when she develops bladder infections.     TREATMENT AND PLAN    Patient Goals: improve urinary continence by 75% or better  Anticipated Discharge Date: 07/19/17    Interventions/Treatment provided:     Neuromuscular re-education:  -education on modifying position for pelvic floor muscle exercises  -education on pelvic floor muscle relaxation strategies if she has an infection    HOME PROGRAM:  Experiment with a variety of positions for pelvic floor muscle exercise: sitting, kneeling,  half kneeling, standing, standing with legs apart, standing with one foot in front of the other, standing with one foot on a step, standing on a hill, standing with trunk supported on counter top, etc    Consider how to get fluid gradually throughout the course of the day.     Access Code: [email or web address]        URL: [email or web address]        Date: 04/20/2017   Prepared by: Larkin InaNatasha Isak Sotomayor     Program Notes   Double voiding as needed     Exercises  Kegels - 5x weekly: can also do lying on your side with knees bent up.  1) quick flicks: 5 reps, take a break, and repeat. 3-4 sets total.   2) long holds: hold for 10 seconds, relax for 10 seconds, 8-12 reps, 2 sets/day.     Patient Education: verbal instructions and demonstrations were provided for the above exercises  a handout was provided describing the exercises in written and picture format  the exercises that were  provided last session were reviewed with the patient today    Interventions for next therapy session:  Follow up in about 4 weeks. Assess concerns re: urge urinary incontinence or stress urinary incontinence. Follow up on if impressa was helpful. Address concerns and update plan of care.     Jakie Debow L

## 2017-05-11 NOTE — Patient Instructions (Signed)
Experiment with a variety of positions for pelvic floor muscle exercise: sitting, kneeling, half kneeling, standing, standing with legs apart, standing with one foot in front of the other, standing with one foot on a step, standing on a hill, standing with trunk supported on counter top, etc

## 2017-05-15 ENCOUNTER — Encounter (INDEPENDENT_AMBULATORY_CARE_PROVIDER_SITE_OTHER): Payer: Self-pay | Admitting: Family Medicine

## 2017-05-18 ENCOUNTER — Encounter (HOSPITAL_BASED_OUTPATIENT_CLINIC_OR_DEPARTMENT_OTHER): Payer: Medicare HMO | Admitting: Speech-Language Pathologist

## 2017-05-23 NOTE — Telephone Encounter (Signed)
I called the automated system to find out who this person is to obtain an authorization and get a fax number.  When I called and put in the reference number, the automated system gave me the following info:    "Speech Therapy is approved for this patient from 05/04/17 thru 06/01/17 approval# [id number], not a guarantee of payment, please notify the patient," and hung up.    I could not talk to a live person to ask if they neeeded the chart notes or anything else from [place].  Is this the info that is needed for this patient?

## 2017-05-24 NOTE — Telephone Encounter (Signed)
It looks like this has already been taken care of. Thanks for following up.    Cassie Contreras

## 2017-05-31 ENCOUNTER — Encounter (HOSPITAL_BASED_OUTPATIENT_CLINIC_OR_DEPARTMENT_OTHER): Payer: Medicare HMO | Admitting: Speech-Language Pathologist

## 2017-06-01 ENCOUNTER — Other Ambulatory Visit (INDEPENDENT_AMBULATORY_CARE_PROVIDER_SITE_OTHER): Payer: Self-pay | Admitting: Family Medicine

## 2017-06-01 DIAGNOSIS — N390 Urinary tract infection, site not specified: Secondary | ICD-10-CM

## 2017-06-01 MED ORDER — TAMSULOSIN HCL 0.4 MG OR CAPS
0.4000 mg | ORAL_CAPSULE | Freq: Every evening | ORAL | 0 refills | Status: DC
Start: 2017-06-01 — End: 2017-06-20

## 2017-06-01 NOTE — Telephone Encounter (Signed)
Patient is due for yearly exam.  One refill authorized.  Please schedule follow up visit.

## 2017-06-01 NOTE — Telephone Encounter (Signed)
Patient scheduled for follow up.

## 2017-06-07 ENCOUNTER — Ambulatory Visit (HOSPITAL_BASED_OUTPATIENT_CLINIC_OR_DEPARTMENT_OTHER): Payer: Medicare HMO | Admitting: Rehabilitative and Restorative Service Providers"

## 2017-06-07 ENCOUNTER — Telehealth (HOSPITAL_BASED_OUTPATIENT_CLINIC_OR_DEPARTMENT_OTHER): Payer: Self-pay | Admitting: Rehabilitative and Restorative Service Providers"

## 2017-06-07 NOTE — Telephone Encounter (Signed)
Called due to patient no show. LVM with call back number to reschedule.

## 2017-06-12 ENCOUNTER — Ambulatory Visit: Payer: Medicare HMO | Attending: Otolaryngology | Admitting: Speech-Language Pathologist

## 2017-06-12 ENCOUNTER — Ambulatory Visit (HOSPITAL_BASED_OUTPATIENT_CLINIC_OR_DEPARTMENT_OTHER): Payer: Medicare HMO | Admitting: Rehabilitative and Restorative Service Providers"

## 2017-06-12 DIAGNOSIS — N8189 Other female genital prolapse: Secondary | ICD-10-CM

## 2017-06-12 DIAGNOSIS — R490 Dysphonia: Secondary | ICD-10-CM | POA: Insufficient documentation

## 2017-06-12 DIAGNOSIS — N3946 Mixed incontinence: Secondary | ICD-10-CM | POA: Insufficient documentation

## 2017-06-12 NOTE — Progress Notes (Signed)
SPEECH LANGUAGE PATHOLOGY/LARYNGOLOGY   Progress note     The following patient identifiers were confirmed today: name and date of birth.    Time in: 1255 Duration: 65 min   General Assessment  Certification From*: 67/34/19  Certification To*: 37/90/24  Date of Symptom Onset*: 04/05/17  Start of Care Date*: 04/05/17    Visits from start of care: 3  Referring Provider: Dr. Freddi Starr  Treatment/Therapy Diagnosis:   Problem List Items Addressed This Visit     Dysphonia - Primary          CLINICAL IMPRESSION:  Pt. is a 67 year old female with mildly pressed voice with forward focus and the impression of increased pitch. Occasional throat clear. This is in the setting of R>L TVF bowing, L TVF sluggishness, and likely incomplete glottic closure (though glottic closure was complete with excessive strain).  Patient was able to demonstrate significantly improved voice quality using forward focus, decreased pitch and well coordinated phonation and respiration.  She requires additional treatment to optimize use and integrate these techniques into spontaneous speech. 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  We will work on voice optimization in voice therapy to see if she can get a satisfactory result.  She will then follow up in 3-4 months in clinic and may consider injection augmentation at that time if indicated.     PERTINENT MEDICAL/SURGICAL HISTORY:   Past Medical History:   Diagnosis Date    Abdominal pain     Anxiety     Arthritis     Asthma     Constipation     Depression     GERD (gastroesophageal reflux disease)     Headache     Heart palpitations     History of irritable bowel syndrome     Hormone disorder     Metatarsal stress fracture of right foot     TX FROM PAPER CHART      Primary insomnia     Squamous cell carcinoma in situ of skin     Thyroid disease     UTI (urinary tract infection)        Past Surgical History:   Procedure Laterality Date    Tonsilectomy      Childhood    URETHRAL  DILATION/DVIU         HISTORY:  Patient is a 67 year old female with chief complaint of unpredictable hoarseness and intermittent solid food dysphagia for several years. 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 She noticed some unpredictability of her voice for the past 5 years. She reported that her voice fluctuates daily, she's been told her voice is soft, her voice gets hoarse easily, and she feels like she strains to talk. She endorsed vocal fatigue. She feels her voice is worse with stress. Her voice is worst in the morning or after she hasn't spoken in a while. She did participate in voice therapy 3-4 years ago and uses straw phonation as a warm up and uses nasal consonants to bring her voice up "into her mask".     She also reported intermittent solid food dysphagia. When she swallows certain dry dense foods (like chicken) she sometimes feels them sticking in her mid-lower chest. If she tries to take a drink when this happens, it doesn't go down and she regurgitates the water back  up. This occurs 2-3 times per month. The frequency of incidents increased after she took antibiotics for bronchitis this past summer. She also reported that eating popcorn can make her cough. Pills can also get stuck in the same place but not as severely. She denied difficulty swallowing water. She eats a general diet, no unintentional weight loss, no pneumonia. She does report a history of reflux for which she has been treated off and on over the years. Pt. is taking omeprazole once a day at bedtime. She denied dyspnea. She has a remote smoking history (smoked socially for 5-10 years over 40 years ago). She drinks 2-3 cups of coffee (half decaf), drinks ~2 alcoholic beverages per week, and adequate water intake.    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 She also  speaks during liturgy for up to half an hour at a time.  She has worked with straw phonation and forward focus on nasal consonants in the past.  She continues to try to use this techniques.  She endorses vocal fatigue and strain after about 2 services.  Services can have up to 350 people.  She is amplified.  Her voice worsens with stress.  She thinks that her pitch is the same as before.  When she was doing voice therapy in the past, she found it difficult to use techniques and speak at the same time.  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    Esophagram results per radiology note:  IMPRESSION:  Mild esophageal dysmotility, otherwise normal esophagram.    Prior Level of Function: Before the onset of the current condition, the patient was able to speak and swallow normally.    SUBJECTIVE:   Pt. was alert and ready to participate     Patient Statement: Patient reported no significant changes since last visit.  She does feel that things are going well with her voice.  She has been doing home exercises, which are going fine.  She is continuing to take Prilosec.  She does note some sternal sticking with pills, likely secondary to esophageal dysmotility, and she is using ice cream to help with swallowing.  This seems to work.  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  Patient endorses intermittent increased phlegm.  Today is one of those days.    Barriers to learning: none  Preferred Learning style: demonstration, written and verbal  Cultural Practices that Influence Care: none    The patient is reporting 0/10 level of pain now, on a 0-10 Numeric Pain Distress Scale. Worst level of pain in the past 24 hours: 0/10.    TREATMENT & EDUCATION:    SLP Skilled Services Provided: Speech Language Voice Treatment CPT [phone]    OBJECTIVE: Patient presents today with mildly pressed voice with forward focus and the impression of  increased pitch.      Treatment focused on:    Reflux: We discussed that patient could use any pured texture to help avoid esophageal sticking when she takes pills.  She also knows that she can consider a consult with GI for esophageal dysmotility if desired.    Straw Phonation: Straw phonation was used in order to facilitate patient awareness of target voice free of effort and strain. Tasks included sustained phonation, pitch glides, accents, song and speech through the straw. Following straw phonation patient's voice quality was easy and light without pressed phonation. Encouraged generalization  of easy unstrained forward voicing to conversation. Ongoing skilled therapy warranted in order to optimize use of target voice with functional speech.     Forward Focus: We reviewed forward, oral tone focus for producing and projecting voice without strain. Techniques used today included: Nasal consonants, Twang, /v/, /z/, Y-Buzz, +Y-Buzz, /r/ and lip and tongue trills.  Using forward focus, patient's voice quality was significantly improved.  She had difficulty today with Neva Seat, +Y-buzz and twang and was actually doing very well with nasal consonants.  We worked on having her really detect the vibration and then carried into spontaneous speech with and without nasal consonants.  She did require cues to decrease pressed voicing and continue to allow coordinated phonation and respiration.  Patient was able to demonstrate at sentence and conversational levels with moderate cues.      Loudness: Volume was appropriate.    Pitch, Auditory Feedback and Biofeedback:  Target pitch during treatment was 207 to 220Hz . Voice quality was good using appropriate pitch.  With good forward focus and use of target pitch, the high-pitched quality decreased.    Next session: Continue with forward focus at sentence level and at level of spontaneous speech using nasal consonants.  Mock sermon.  Flow phonation.     TREATMENT AND  EDUCATION:    Education:  Plan/recommendations were reviewed with patient/caregiver.  The following were: Taught to patient/caregiver; voice    Contact information was provided for further questions.     Response: patient/caregiver verbalized understanding and may need further instruction    Skilled Services Provided: Speech language voice treatment    ASSESSMENT:  See clinical impression above.     Rehabilitation potential is: Good  Potential Barriers to achieve rehab goals: none  These goals were discussed and the patient agrees with them.    DISCHARGE GOALS:  1. Maintain best quality voice for functional communication and participation in community activities:   1a. Evaluate status of vocal folds  1b. Patient will participate in education of basic anatomy and physiology of vocal fold vibration  1d. Patient will express comprehension of good vocal hygiene   1e. Patient will express comprehension of reflux management  1g. During 2 minutes of spontaneous speech without cues, patient will demonstrate: easy onset/flow phonation, forward focus and coordinated phonation and respiration     Goals to be met by 07/04/17.    Patient/Family Input to Goals: Pt. wishes to: improve voice quality and know status of vocal folds    PLAN OF CARE:   Frequency/Duration of Treatment: Patient will be seen up to 1 time per week for 3-5 visits over a period of 90 days.     This patient will recheck with the referring provider per MD recs.     The plan outlined above was formulated, reviewed, and agreed upon with the patient and/or family/caregiver.  Patient/caregiver verbalized understanding and restated information in plan.    Deland Pretty, MS, [place] [facility]  Voicemail: [phone]  Pager: [phone]      Referring Physician/Practitioner Certification Statement:    I Certify the need for these services furnished under this plan of treatment and while under my  care.

## 2017-06-12 NOTE — Patient Instructions (Signed)
Quick flicks after you brush your teeth.  Weights at end of summer if needed.  Keep up with the good bladder habits.   Can reduce exercise frequency at end of summer to see if the benefits last.   GI doc for IBS concerns.  Email Dr. Hyacinth Meeker with progress to decide on follow up plan.  Follow up with Tasha as needed.

## 2017-06-12 NOTE — Progress Notes (Signed)
PHYSICAL THERAPY DAILY NOTE (Medicare)      GENERAL VISIT INFO    Encounter Diagnoses   Name Primary?    Mixed incontinence     Pelvic floor weakness      Referring Provider: Cranston Neighbor, MD  Referring Provider NPI: [id number]    VISIT COUNT   4    EVALUATION     Cassie Contreras reports doing well overall since last visit which was > 1 month ago. She reports one day in which she had leakage without urge and she isn't sure why. I provided her some possibilities about why this can happen, and she identified with a number of those possibilities. This didn't happen again. She didn't try the impressa, but she is noticing less leakage when walking down hills anyway. She also stopped the detrol LA and has been doing well.    Past Medical History:   Diagnosis Date    Abdominal pain     Anxiety     Arthritis     Asthma     Constipation     Depression     GERD (gastroesophageal reflux disease)     Headache     Heart palpitations     History of irritable bowel syndrome     Hormone disorder     Metatarsal stress fracture of right foot     TX FROM PAPER CHART      Primary insomnia     Squamous cell carcinoma in situ of skin     Thyroid disease     UTI (urinary tract infection)        Past Surgical History:   Procedure Laterality Date    Tonsilectomy      Childhood    URETHRAL DILATION/DVIU         Social History     Social History Narrative    Not on file         ASSESSMENT    Cassie Contreras has progressed as expected, with improvement but not resolution of urinary incontinence. This is reasonable though given how long she has done the therapy. We discussed the GI system concerns (including how they relate to the urology system) today and I recommended she follow up with a GI provider. I also provided with an idea to help her remember to do the quick flicks. Given her progress and understanding, she is okay to follow up as needed at this point.     TREATMENT AND PLAN    Patient Goals: improve urinary continence by 75% or  better (goal met)  Anticipated Discharge Date: 07/19/17    Interventions/Treatment provided:     -review home program verbally, discussed relationship between urologic and GI systems, discussed management of her GI concerns, reviewed expectations for progression of her pelvic floor muscle strengthening    HOME PROGRAM:  Experiment with a variety of positions for pelvic floor muscle exercise: sitting, kneeling, half kneeling, standing, standing with legs apart, standing with one foot in front of the other, standing with one foot on a step, standing on a hill, standing with trunk supported on counter top, etc    Consider how to get fluid gradually throughout the course of the day.     Access Code: [email or web address]        URL: [email or web address]        Date: 04/20/2017   Prepared by: Mateo Flow     Program Notes   Double voiding as needed     Exercises  Kegels - 5x  weekly: can also do lying on your side with knees bent up.  1) quick flicks: 5 reps, take a break, and repeat. 3-4 sets total.   2) long holds: hold for 10 seconds, relax for 10 seconds, 8-12 reps, 2 sets/day.     Patient Education: verbal instructions and demonstrations were provided for the above exercises  a handout was provided describing the exercises in written and picture format  the exercises that were provided last session were reviewed with the patient today    Interventions for next therapy session:  Follow up as needed     Cassie Contreras

## 2017-06-20 ENCOUNTER — Encounter (INDEPENDENT_AMBULATORY_CARE_PROVIDER_SITE_OTHER): Payer: Self-pay | Admitting: Family Medicine

## 2017-06-20 ENCOUNTER — Ambulatory Visit (INDEPENDENT_AMBULATORY_CARE_PROVIDER_SITE_OTHER): Payer: Medicare HMO | Admitting: Family Medicine

## 2017-06-20 ENCOUNTER — Encounter (HOSPITAL_BASED_OUTPATIENT_CLINIC_OR_DEPARTMENT_OTHER): Payer: Medicare HMO | Admitting: Speech-Language Pathologist

## 2017-06-20 VITALS — BP 109/67 | HR 56 | Temp 97.9°F | Resp 12 | Wt 152.2 lb

## 2017-06-20 DIAGNOSIS — L709 Acne, unspecified: Secondary | ICD-10-CM

## 2017-06-20 DIAGNOSIS — R490 Dysphonia: Secondary | ICD-10-CM

## 2017-06-20 DIAGNOSIS — N390 Urinary tract infection, site not specified: Secondary | ICD-10-CM

## 2017-06-20 DIAGNOSIS — F419 Anxiety disorder, unspecified: Secondary | ICD-10-CM

## 2017-06-20 DIAGNOSIS — F5104 Psychophysiologic insomnia: Secondary | ICD-10-CM

## 2017-06-20 DIAGNOSIS — N761 Subacute and chronic vaginitis: Secondary | ICD-10-CM

## 2017-06-20 DIAGNOSIS — R002 Palpitations: Secondary | ICD-10-CM

## 2017-06-20 DIAGNOSIS — R300 Dysuria: Secondary | ICD-10-CM

## 2017-06-20 DIAGNOSIS — H6123 Impacted cerumen, bilateral: Secondary | ICD-10-CM

## 2017-06-20 MED ORDER — PHENAZOPYRIDINE HCL 200 MG OR TABS
200.0000 mg | ORAL_TABLET | Freq: Two times a day (BID) | ORAL | 0 refills | Status: DC
Start: 2017-06-20 — End: 2017-08-07

## 2017-06-20 MED ORDER — TAMSULOSIN HCL 0.4 MG OR CAPS
0.4000 mg | ORAL_CAPSULE | Freq: Every evening | ORAL | 0 refills | Status: DC
Start: 2017-06-20 — End: 2017-08-07

## 2017-06-20 MED ORDER — TRETINOIN 0.05 % EX CREA
TOPICAL_CREAM | Freq: Every evening | CUTANEOUS | 2 refills | Status: DC
Start: 2017-06-20 — End: 2017-10-17

## 2017-06-20 MED ORDER — OMEPRAZOLE 40 MG OR CPDR
40.0000 mg | DELAYED_RELEASE_CAPSULE | Freq: Every day | ORAL | 3 refills | Status: DC
Start: 2017-06-20 — End: 2017-08-07

## 2017-06-20 MED ORDER — VAGIFEM 10 MCG VA TABS
10.0000 ug | ORAL_TABLET | VAGINAL | 1 refills | Status: DC
Start: 2017-06-20 — End: 2017-09-26

## 2017-06-20 NOTE — Patient Instructions (Signed)
[email or web address]

## 2017-06-20 NOTE — Progress Notes (Signed)
Reason for visit: med refill       Cervical screening/PAP:no  Mammo: yes  Colon Screen: yes  Have seen a specialist since your last visit: YES     HM Due:   Health Maintenance   Topic Date Due    Zoster Vaccine (2 of 3) 12/28/2010    Pneumococcal Vaccine (2 of 2 - PPSV23) 11/06/2016    Medicare Annual Wellness Visit  10/20/2017    Depression Screening (PHQ-2)  11/03/2017    Breast Cancer Screening  11/30/2018    Lipid Disorders Screening  11/09/2020    Tetanus Vaccine  03/24/2022    Colorectal Cancer Screening (Colonoscopy)  02/23/2025    Osteoporosis Screening  Completed    Influenza Vaccine  Completed    Hepatitis C Screening  Completed     Future Appointments   Date Time Provider Department Center   06/20/2017  2:30 PM Hei, Darlyn Chamber, MD [facility]   06/26/2017 11:00 AM Rosenzweig, Lauralee Evener, CCC-SLP UOTSL [facility]   06/29/2017 10:40 AM Elon Spanner, MD UUROGY [facility]   08/21/2017  1:00 PM Elon Spanner, MD UUROGY [facility]   10/15/2017 11:20 AM Merati, Malachy Mood, MD Jamey Ripa [facility]       Ear Lavage Procedure Documentation:   After written order from provider, I irrigated the patient's ear(s) using the [facility] system and body temperature (warm) water.    Which side did you treat?  Both sides     Did you put a softening agent in? (if yes, indicate the agent used): Yes, debrox     Did you visualize the eardrum afterwards? yes     Did patient tolerate the procedure well? (if no, indicate any problems): Yes   Carol Ada, CMA

## 2017-06-24 NOTE — Progress Notes (Signed)
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  Pathophysiology, work-up and treatment rationale explained in detail.       (R00.2) Palpitations  Plan: given recent light-headedness she's going to try stopping metoprolol and monitor for recurrence of palpitations    (R49.0) Dysphonia  Plan: Omeprazole 40 MG Oral CAPSULE DELAYED RELEASE        refill    (R30.0) Dysuria  Plan: Phenazopyridine HCl (PYRIDIUM) 200 MG Oral Tab        refill    (N39.0) Recurrent UTI  Plan: Tamsulosin HCl (FLOMAX) 0.4 MG Oral Cap        refill    (L70.9) Acne, unspecified acne type  Plan: tretinoin 0.05 % External Cream        refill    (N76.1) Subacute vaginitis  Plan: VAGIFEM 10 MCG Vaginal Tab        refill    (H61.23) Cerumen debris on tympanic membrane of both ears  Plan: REMOVAL IMPACTED CERUMEN IRRIGATION/LVG UNILAT        as below.        END OF VISIT SUMMARY  PLEASE SEE BELOW FOR SUPPORTING DOCUMENTATION AND DETAILS.   -----------------------------------------------------------------------------------------------------------------    SUBJECTIVE:  Cassie Contreras is a 67 year old female presenting today for the following issue(s):    F/u of med management.  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  - also needs refills on number of other medications. She's had her metoprolol reduced to 25mg  daily for h/o palpitations but she does also occ have light-headedness when rising quickly.  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  No acute distress.  Blood pressure 109/67, pulse 56, temperature 97.9 F (36.6 C), temperature source Temporal, resp. rate 12, weight 152 lb 3.2 oz (69 kg), SpO2 100 %.   PSYCH: Makes good eye contact, dressed and groomed neatly and casually, tells a logical linear hx with no signs of thought d/o.   EARS: R>L bilateral cerumen occlusion, cleared after irrigation    -----------------------------------------------------------------------------------------------------------------  ALSO SEE THE BEGINNING OF THIS NOTE FOR ASSESSMENT AND PLAN  -----------------------------------------------------------------------------------------------------------------

## 2017-06-25 ENCOUNTER — Encounter (HOSPITAL_BASED_OUTPATIENT_CLINIC_OR_DEPARTMENT_OTHER): Payer: Self-pay | Admitting: Speech-Language Pathologist

## 2017-06-26 ENCOUNTER — Encounter (HOSPITAL_BASED_OUTPATIENT_CLINIC_OR_DEPARTMENT_OTHER): Payer: Medicare HMO | Admitting: Speech-Language Pathologist

## 2017-06-27 ENCOUNTER — Encounter (INDEPENDENT_AMBULATORY_CARE_PROVIDER_SITE_OTHER): Payer: Self-pay | Admitting: Family Medicine

## 2017-06-27 ENCOUNTER — Ambulatory Visit (INDEPENDENT_AMBULATORY_CARE_PROVIDER_SITE_OTHER): Payer: Medicare HMO | Admitting: Family Medicine

## 2017-06-27 VITALS — BP 146/82 | HR 70 | Temp 97.9°F | Resp 16 | Wt 152.0 lb

## 2017-06-27 DIAGNOSIS — J40 Bronchitis, not specified as acute or chronic: Secondary | ICD-10-CM

## 2017-06-27 MED ORDER — GUAIFENESIN-CODEINE 100-10 MG/5ML OR SYRP
10.0000 mL | ORAL_SOLUTION | ORAL | 0 refills | Status: DC | PRN
Start: 2017-06-27 — End: 2017-07-09

## 2017-06-27 MED ORDER — DOXYCYCLINE MONOHYDRATE 100 MG OR CAPS
100.0000 mg | ORAL_CAPSULE | Freq: Two times a day (BID) | ORAL | 0 refills | Status: AC
Start: 2017-06-27 — End: 2017-07-07

## 2017-06-27 NOTE — Patient Instructions (Signed)
Guaifenecin cough medicine    Antibiotic only if symptoms not better or worse by Sunday or MOnday

## 2017-06-27 NOTE — Progress Notes (Signed)
ASSESSMENT/PLAN:  (J40) Bronchitis  (primary encounter diagnosis)  Plan: guaiFENesin-codeine 100-10 MG/5ML Oral Syrup,         Doxycycline Monohydrate 100 MG Oral Cap        Benign exam, will use cough meds to control symptoms for a few days and abx she's taken before only if symptoms persist or worsen after 3-4 days.  f/u prn no improvement.        END OF VISIT SUMMARY  PLEASE SEE BELOW FOR SUPPORTING DOCUMENTATION AND DETAILS.   -----------------------------------------------------------------------------------------------------------------    SUBJECTIVE:  Cassie Contreras is a 67 year old female presenting today for the following issue(s):    3 days of productive cough and low grade fever w nasal discharge as well.  Cough disrupts sleep.  Husband was similar ill but seems to be getting better.  Some nausea without vomiting, no diahhrea or rash.    ROS: as above.     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  No acute distress.  Blood pressure (!) 146/82, pulse 70, temperature 97.9 F (36.6  C), temperature source Oral, resp. rate 16, weight 152 lb (68.9 kg), SpO2 99 %.   HEENT: Lids, sclerae and conjunctivae clear. External ears and canals clear. TM's normal bilaterally. Moderately boggy turbinates with clear discharge. No sinus tenderness. Oropharynx normal.  NECK: supple without palpable adenopathy. No thyroid enlargement or discrete nodule.  LUNGS: The lungs are clear to auscultation.  CV: Regular rate and rhythm. S1 and S2 normal, no murmurs, clicks, gallops or rubs.     -----------------------------------------------------------------------------------------------------------------  ALSO SEE THE BEGINNING OF THIS NOTE FOR ASSESSMENT AND PLAN  -----------------------------------------------------------------------------------------------------------------

## 2017-06-27 NOTE — Progress Notes (Signed)
Reason for visit: cough and fever       Cervical screening/PAP:no  Mammo: yes  Colon Screen: yes  Have seen a specialist since your last visit: NO     HM Due:   Health Maintenance   Topic Date Due    Zoster Vaccine (2 of 3) 12/28/2010    Pneumococcal Vaccine (2 of 2 - PPSV23) 11/06/2016    Medicare Annual Wellness Visit  10/20/2017    Depression Screening (PHQ-2)  06/21/2018    Breast Cancer Screening  11/30/2018    Lipid Disorders Screening  11/09/2020    Tetanus Vaccine  03/24/2022    Colorectal Cancer Screening (Colonoscopy)  02/23/2025    Osteoporosis Screening  Completed    Influenza Vaccine  Completed    Hepatitis C Screening  Completed           Future Appointments   Date Time Provider Department Center   06/27/2017  8:30 AM Hei, D[facility]n [facility]homas Kou[facility]   06/29/2017 10:40 AM Elon SpannerMiller, Jane Louise, [facility]   08/06/2017 11:20 AM Virgina JockMandell, Remo Lippsandy Brian, MD [facility]   08/21/2017  1:00 PM Elon SpannerMiller, Jane Louise, MD [facility]   10/15/2017 11:20 AM Merati, Malachy MoodAlbert Lincoln, MD [facility] Crissie SicklesTOLARY

## 2017-06-29 ENCOUNTER — Ambulatory Visit: Payer: Medicare HMO | Attending: Urology | Admitting: Urology

## 2017-06-29 VITALS — BP 116/69 | HR 64 | Ht 68.5 in | Wt 152.0 lb

## 2017-06-29 DIAGNOSIS — Z6822 Body mass index (BMI) 22.0-22.9, adult: Secondary | ICD-10-CM

## 2017-06-29 DIAGNOSIS — N3946 Mixed incontinence: Secondary | ICD-10-CM

## 2017-06-29 NOTE — Progress Notes (Signed)
Pt returns for f/u of mixed urinary incontinence, lifelong utis and ? neuromuscular obstructive outlet dysfunction.      Since she saw me she has been working with pelvic foor PT, continued vaginal estrogen and flomax. She tried detrol without benefit, but has found significant improvement in her urinary symptoms with these.  She currently is pleased with her urinary symptoms and would not make any changes at this time.  She may try stopping the flomax to see if she needs it or not.    She will f/u with me prn.    I spent a total time of 8 minutes face-to-face with the patient, of which more than 50% was spent counseling as outlined in this note.

## 2017-07-02 ENCOUNTER — Encounter (HOSPITAL_BASED_OUTPATIENT_CLINIC_OR_DEPARTMENT_OTHER): Payer: Self-pay | Admitting: Urology

## 2017-07-03 ENCOUNTER — Encounter (HOSPITAL_BASED_OUTPATIENT_CLINIC_OR_DEPARTMENT_OTHER): Payer: Self-pay | Admitting: Speech-Language Pathologist

## 2017-07-09 ENCOUNTER — Encounter (INDEPENDENT_AMBULATORY_CARE_PROVIDER_SITE_OTHER): Payer: Self-pay | Admitting: Family Medicine

## 2017-07-09 ENCOUNTER — Encounter (HOSPITAL_BASED_OUTPATIENT_CLINIC_OR_DEPARTMENT_OTHER): Payer: Self-pay | Admitting: Otolaryngology

## 2017-07-09 DIAGNOSIS — J40 Bronchitis, not specified as acute or chronic: Secondary | ICD-10-CM

## 2017-07-09 MED ORDER — GUAIFENESIN-CODEINE 100-10 MG/5ML OR SYRP
10.0000 mL | ORAL_SOLUTION | ORAL | 0 refills | Status: DC | PRN
Start: 2017-07-09 — End: 2017-08-07

## 2017-07-09 NOTE — Telephone Encounter (Signed)
Faxed

## 2017-07-09 NOTE — Telephone Encounter (Signed)
Covering provider.  Chart reviewed.  Bronchitis.  PMP reviewed and not concerning.  Reasonable to extend.  Printed and given to Deer ParkNina MA to fax.

## 2017-07-12 ENCOUNTER — Encounter (INDEPENDENT_AMBULATORY_CARE_PROVIDER_SITE_OTHER): Payer: Self-pay | Admitting: Family Medicine

## 2017-07-13 NOTE — Telephone Encounter (Signed)
Replied

## 2017-08-06 ENCOUNTER — Ambulatory Visit (INDEPENDENT_AMBULATORY_CARE_PROVIDER_SITE_OTHER): Payer: Medicare HMO | Admitting: Internal Medicine

## 2017-08-06 VITALS — BP 122/82 | HR 60 | Temp 97.1°F | Resp 14 | Ht 68.5 in | Wt 150.0 lb

## 2017-08-06 DIAGNOSIS — Z6822 Body mass index (BMI) 22.0-22.9, adult: Secondary | ICD-10-CM

## 2017-08-06 DIAGNOSIS — G4733 Obstructive sleep apnea (adult) (pediatric): Secondary | ICD-10-CM

## 2017-08-06 NOTE — Patient Instructions (Signed)
What to Expect After Your HOME Sleep Study      By now, you’re ready to call [phone] to arrange pick-up of your Home Sleep Study equipment. Please call 48 hours after your visit today to call the sleep center, as they need to process your order before scheduling your sleep study appointment.  Sensitivity of Home Studies: Please understand that while home studies are less expensive than hospital studies, they can sometimes generate a “false negative” result. This means that, they may not pick up your sleep apnea. If a false negative result is obtained, we will contact you to schedule a hospital study.    Reading Your Study: Please be aware that it will take about 10 days to read your home sleep study.    Getting Your Equipment: If you test positive for sleep apnea, we will call your CPAP Company with medical orders so that you can get your CPAP machine. Please note that the speed with which you get your machine depends on that company and your insurance. These are private companies, and  the rate at which they process you order will vary.  While we don’t control the speed of their operations and are unable to track the status of your order, if you are experiencing delays or other problems in obtaining your CPAP, please feel free to call me and I’ll try to help. If we re-contact them and you’re still having problems, please call us back.  Again, we can’t track that status of your order.     Following Up: The standard follow-up timeframe is 6 weeks after patients start using their CPAP. If you are having troubles adapting to CPAP, I would be happy to see you sooner to help you. Please don’t forget to schedule this check-up appointment. If you have questions after your study, and want to discuss your study before beginning CPAP, please schedule an appointment with me so that he can most effectively address your concerns.   Please note that some insurance companies will monitor how often you used you CPAP machine  the first 4-8 weeks. If you are having trouble adapting to your CPAP, please contact us for a follow-up appointment so that you do not risk losing your equipment.  For any type of follow-up appointment, it is important that you please remember to bring all of your CPAP equipment (machine, power cords, and mask) to your visit so that it can be thoroughly checked and its efficacy evaluated.

## 2017-08-06 NOTE — Progress Notes (Signed)
Sleep medicine consult  Chief Complaint: Sleepiness    History of Present Illness: I am seeing Cassie Contreras in consultation, requested by Cassie Contreras, Cassie Kou, MD for evaluation of sleepiness. She shows and has insomnia.    Cassie Contreras goes to bed around 9PM, takes 15 minutes to fall asleep, than wakes up at 5:30AM [name] wakes up 2-3 times a night, due to worry/bladder/.  She has no restless leg symptomatology. No hypnagogic or hypnopompic hallucination, sleep paralysis or cataplexy. She snores, at an intensity of 2/5, with ? Rare witnessed pauses.   She does wake up with a dry mouth, with a sore throat, without nasal congestion. She wakes up with a morning headache. She wakes up at least twice a night to urinate. She dors not have GERD symptoms interferering with sleep.  She does not have nightmares. She  grinds her teeth. She does not sleep talk, and she does not act out her dreams.    Cassie Contreras has trouble awaking in the morning, and fatigue and sleepiness are almost always a problem for her. Her Epworth Sleepiness Scale is 1/24, but feels more tired than this. She consider her general health to be excellent. Her weight is Wt 150 lbs , and was 135 pounds at age 67.      Review of Symptoms:  Neurological: headaches  Heart: chest pan  Lungs: bo wheezing  Kidney/bladder: no nocturia  GI: GERD  Musculoskeleatal: no back pain  Allergy/Asthma: no seasonal allergy  General: no weight loss  ENT: no TMJ  Eyes: no visual change  Endocrine: no thirst  Hematological: no easy bruising  Psychiatric: no depression    Review of patient's allergies indicates:  Allergies   Allergen Reactions    Erythromycin Other     Strange smells but no rash or trouble breathing.    Ibuprofen      TX FROM PAPER CHART      Sulfa Antibiotics      TX FROM PAPER CHART         Current Outpatient Medications   Medication Sig Dispense Refill    Bimatoprost (LATISSE) 0.03 % External Solution Apply 1 application topically.  Apply nightly to upper eyelid at base of eyelashes with applicator. Blot excess solution. Repeat for opposite eyelid using new applicator.      ClonazePAM 0.5 MG Oral Tab Take 1 tablet (0.5 mg) by mouth daily as needed (anxiety). 20 tablet 0    guaiFENesin-codeine 100-10 MG/5ML Oral Syrup Take 10 mL by mouth every 4 hours as needed for cough. 180 mL 0    Ibuprofen-Diphenhydramine Cit (ADVIL PM OR) Take 200 mg by mouth.      Omeprazole 40 MG Oral CAPSULE DELAYED RELEASE Take 1 capsule (40 mg) by mouth daily on an empty stomach. 90 capsule 3    Phenazopyridine HCl (PYRIDIUM) 200 MG Oral Tab Take 1 tablet (200 mg) by mouth 2 times a day after meals. For urinary discomfort. 10 tablet 0    Tamsulosin HCl (FLOMAX) 0.4 MG Oral Cap Take 1 capsule (0.4 mg) by mouth at bedtime. 90 capsule 0    tretinoin 0.05 % External Cream Apply to affected area on face at bedtime. 20 g 2    VAGIFEM 10 MCG Vaginal Tab Place 1 tablet (10 mcg) into the vagina 3 times a week. 36 tablet 1     No current facility-administered medications for this visit.  Patient Active Problem List   Diagnosis    Os peroneum syndrome of left foot    S/P foot & ankle surgery, left    Neuritis of left sural nerve    Recurrent UTI    Osteopenia of spine    Palpitations    Atrophic vaginitis    Hormone replacement therapy    Thyroid nodule    Dysphonia    Mixed incontinence    Pelvic floor weakness          Past Surgical History:   Procedure Laterality Date    Tonsilectomy      Childhood    URETHRAL DILATION/DVIU         Past Medical History:   Diagnosis Date    Abdominal pain     Anxiety     Arthritis     Asthma     Constipation     Depression     GERD (gastroesophageal reflux disease)     Headache     Heart palpitations     History of irritable bowel syndrome     Hormone disorder     Metatarsal stress fracture of right foot     TX FROM PAPER CHART      Primary insomnia     Squamous cell carcinoma in situ of skin      Thyroid disease     UTI (urinary tract infection)        Social History: Cassie Contreras is married, with children. Work history: pastor Smoking history: quit. EtOH history: rare. Recreational drug history: no. Caffeine history: 2 coffee    Physical Examination:  Cassie Contreras is pleasant, alert and orientated in no acute distress  BP 122/82    Pulse 60    Temp 97.1 F (36.2 C) (Temporal)    Resp 14    Ht 5' 8.5" (1.74 m)    Wt 150 lb (68 kg)    SpO2 100%    BMI 22.48 kg/m  Body mass index is 22.48 kg/m. Neck: 13   HEAD: Oral airway Malpatti class III airway,  HEENT: Pupils equal reactive to light. Sclera and conjunctivae clear. External ears and canals clear. TM's normal bilaterally. Nose normal without lesions or discharge. Gums and dentition normal. Oropharynx normal with normal tongue and palate. Neck without palpable adenopathy. No thyroid enlargemant or discrete nodule noted at this time.  PSYCH: Normal  EYES: Normal  LUNGS: The lungs are clear to auscultation and percussion.  Normal inspiratory excursion.  COR: RRR S1S2, no m/g/r  ABDOMEN: The abdomen shows no visible abnormalities.  Bowel sounds are within normal limits.  There is no tenderness to superficial or deep palpation in any quadrant.  NEURO: Cranial nerves II-IX tested and intact. Gait is normal.   Reflexes:Gait normal  Mental status: Alert, cooperative, oriented.  Thought coherent.  EXT: No edema    Assessment:   1-Likely sleep apnea. Pathophysiology of apnea discussed, and questions answered. Health consequences of untreated apnea discussed. Diagnostic and treatment options discussed.   2-If no apnea, discussed CBT. Advised why apnea evaluation is first  Recommendations:  1-Split night sleep study vs OCST-APAP, discussed-including that OCST underestimates the degree of sleep apnea and can even lead to a false negative requiring a hospital backup study. Advised she bring all of his CPAP equipment to her follow-up appointments. She  chooses an OCST as initial study--and may require a 2nd OCST of first is negative. Compliance rules discussed. UARS rules discussed. APAP if apnea.  2-Follow-up in 2 months, sooner as needed  3-Cassie Contreras is advised not to drive while drowsy. She understands this very important point.      cc: Cassie Spain, MD

## 2017-08-07 ENCOUNTER — Ambulatory Visit (INDEPENDENT_AMBULATORY_CARE_PROVIDER_SITE_OTHER): Payer: Medicare HMO | Admitting: Family

## 2017-08-07 VITALS — BP 108/70 | HR 64 | Temp 98.1°F | Resp 14 | Wt 151.0 lb

## 2017-08-07 DIAGNOSIS — Z23 Encounter for immunization: Secondary | ICD-10-CM

## 2017-08-07 DIAGNOSIS — L739 Follicular disorder, unspecified: Secondary | ICD-10-CM

## 2017-08-07 DIAGNOSIS — Z6822 Body mass index (BMI) 22.0-22.9, adult: Secondary | ICD-10-CM

## 2017-08-07 MED ORDER — MUPIROCIN 2 % EX OINT
TOPICAL_OINTMENT | Freq: Three times a day (TID) | CUTANEOUS | 0 refills | Status: DC
Start: 2017-08-07 — End: 2017-10-17

## 2017-08-07 NOTE — Progress Notes (Signed)
Patient Referred By: No ref. provider found  Patient's PCP: Burns SpainHei, Thomas Kou, MD     Subjective:  Patient is a 67 year old female, here to discuss Other (bump the size of a nickle on bottom for x2 weeks )    The following portions of the patient's history were reviewed with the patient and updated as appropriate: problem list, current medications, allergies and past medical history.    HPI    Cassie Contreras is a 67 yo female here today for concern of nickel sized spot on rear  -thought she had pimples on rear end  -it is getting larger, not weeping, tender when seated and laying down.   -no abrasion or injury.   -no hx of genital herpes.     Review of Systems   Constitutional: Negative for chills, fatigue, fever and unexpected weight change.   Skin: Positive for wound.     BP 108/70    Pulse 64    Temp 98.1 F (36.7 C) (Temporal)    Resp 14    Wt 151 lb (68.5 kg)    SpO2 96%    BMI 22.63 kg/m       Objective:  Physical Exam   Constitutional: She appears well-developed and well-nourished.   Skin:   In natal cleft, 6x8 mm unroofed ulcerated area, slightly weepy, no surrounding inflammation. No abscesses palpable.    Psychiatric: She has a normal mood and affect. Her behavior is normal. Judgment and thought content normal.        Assessment and Plan:   Diagnoses and all orders for this visit:    Need for vaccination  -     pneumococcal (Pneumovax 23) polysaccharide vacc (adult) 0.5 mL IM or SUBCUTANEOUS - [id number]    Folliculitis  -     mupirocin (BACTROBAN) 2 % External Ointment; Apply to affected area on buttock(s) 3 times a day.  -     Cancel: HSV Type 1 & 2 Quant PCR, Blood  -     ZEBRA LABELS  -     Herpes Culture (HSV, CMV, VZV)  Appears to be folliculitis, but will go ahead and culture for herpes.   Recommend keeping area dry, also okay to use gauze with paper tape.   Fu if getting worse.   Pt to test bactroban on arm to ensure she's not allergic before applying to buttocks.   Other orders  -     Discontinue:  Pseudoeph-CPM-DM-APAP (TYLENOL COLD OR); Take by mouth.

## 2017-08-07 NOTE — Progress Notes (Signed)
Vaccine Screening Questions    Interpreter: No    1. Are you allergic to Latex? NO    2.  Have you had a serious reaction or an allergic reaction to a vaccine?  NO    3.  Currently have a moderate or severe illness, including fever?  NO    4.  Ever had a seizure or any neurological problem associated with a vaccine? (DTaP/TDaP/DTP pertinent) NO    5.  Is patient receiving any live vaccinations today? (Varicella-Chickenpox, MMR-Measles/Mumps/Rubella, Zoster-Shingles, Flumist, Yellow Fever) NOTE: oral rotavirus is exempt  NO    If YES to any of the questions above - Do NOT give vaccine.  Consult with RN or provider in clinic.  (#5 can be YES if all Live vaccine questions are answered NO)    If NO to all questions above - Patient may receive vaccine.    6.  Do you need to receive the Flu vaccine today? NO      All patients are encouraged to wait 15 minutes before leaving after receiving any vaccine.    VIS given 08/07/2017 by Ursula Alert, CMA.      Vaccine given today without initial adverse effect. YES    [address] [place], [place]

## 2017-08-08 ENCOUNTER — Ambulatory Visit (INDEPENDENT_AMBULATORY_CARE_PROVIDER_SITE_OTHER): Payer: Self-pay | Admitting: Family Medicine

## 2017-08-08 NOTE — Telephone Encounter (Signed)
(  TEXTING IS AN OPTION FOR [facility] CLINICS ONLY)  Is this a [facility] clinic? n/a      RETURN CALL: General message OK      SUBJECT:  Option 2 ([facility] ONLY)      SUMMARY OF PATIENT'S CONCERN: patient has a culture done yesterday on a sore and was given a pneumonia shot. Patient has been feeling terrible and has a fever of 101.7. Unable to warm transfer, patient ok to have clinic return patient's call.

## 2017-08-09 ENCOUNTER — Encounter (INDEPENDENT_AMBULATORY_CARE_PROVIDER_SITE_OTHER): Payer: Self-pay | Admitting: Family Medicine

## 2017-08-09 NOTE — Telephone Encounter (Signed)
Routing to Nurse Triage to advise on patient symptoms.

## 2017-08-09 NOTE — Telephone Encounter (Signed)
HPI - seen 48 hrs ago. Buttock sore evaluated and given Pneumovax 23 vaccine)   -Yesteray am, felt feverish, took temp - 99.5, then later in the day 101.6. -Called.  -Adviced to take 600mg  ibuprofen, did so, felt better.   today, temp 100. something, took ibuprofen.   -Lots of injection site soreness.   -No rash, cough, URI symptoms, CP, other explanation for fever.   -sore on buttocks about the same - using mupirocin.     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     Culture done for HSV at visit 2 days ago.     P - self care discussed. Call tomorrow if temp > 100. Antipyretics today.     Bernita BuffyKevin Lachelle Rissler, RN  Nurse Triage Services  [facility]        Reason for Disposition   Mild immunization reaction    Additional Information   Negative: Difficulty with breathing or swallowing starts within 2 hours after injection   Negative: Difficult to awaken or acting confused (e.g., disoriented, slurred speech)   Negative: Unresponsive, passed out, or very weak   Negative: Sounds like a life-threatening emergency to the triager   Negative: Redness or red streak around the injection site begins > 48 hours after shot    Protocols used: IMMUNIZATION REACTIONS-ADULT-[place]

## 2017-08-11 LAB — CULTURE:HERPES, HSV 1 & 2, CMV

## 2017-08-13 ENCOUNTER — Encounter (HOSPITAL_BASED_OUTPATIENT_CLINIC_OR_DEPARTMENT_OTHER): Payer: Self-pay

## 2017-08-13 NOTE — Telephone Encounter (Signed)
Please call patient and check on her.  Still with fevers/rash?  If so, she needs to be evaluated in clinic.  If no openings, please advise urgent care.  Thanks!

## 2017-08-13 NOTE — Telephone Encounter (Signed)
1st attempt - Left message requesting patient to call back. Also sent ecare. 2nd attempt.     CCR: If patient calls back, please relay message below. If unable, please take a message and forward the TE to the appropriate Clinical Staff Pool and verify if a detailed VM can be left. If no further action needed, please close encounter.

## 2017-08-14 ENCOUNTER — Encounter (INDEPENDENT_AMBULATORY_CARE_PROVIDER_SITE_OTHER): Payer: Self-pay | Admitting: Family

## 2017-08-14 ENCOUNTER — Other Ambulatory Visit (INDEPENDENT_AMBULATORY_CARE_PROVIDER_SITE_OTHER): Payer: Self-pay | Admitting: Family Medicine

## 2017-08-14 ENCOUNTER — Telehealth (INDEPENDENT_AMBULATORY_CARE_PROVIDER_SITE_OTHER): Payer: Self-pay | Admitting: Family

## 2017-08-14 ENCOUNTER — Other Ambulatory Visit (INDEPENDENT_AMBULATORY_CARE_PROVIDER_SITE_OTHER): Payer: Self-pay | Admitting: Family

## 2017-08-14 DIAGNOSIS — B3731 Acute candidiasis of vulva and vagina: Secondary | ICD-10-CM

## 2017-08-14 DIAGNOSIS — B349 Viral infection, unspecified: Secondary | ICD-10-CM

## 2017-08-14 DIAGNOSIS — E063 Autoimmune thyroiditis: Secondary | ICD-10-CM

## 2017-08-14 DIAGNOSIS — B373 Candidiasis of vulva and vagina: Secondary | ICD-10-CM

## 2017-08-14 LAB — THYROID STIMULATING HORMONE: Thyroid Stimulating Hormone: 1.755 u[IU]/mL (ref 0.400–5.000)

## 2017-08-14 MED ORDER — VALACYCLOVIR HCL 1 G OR TABS
1.0000 g | ORAL_TABLET | Freq: Two times a day (BID) | ORAL | 0 refills | Status: DC
Start: 2017-08-14 — End: 2017-10-17

## 2017-08-14 NOTE — Telephone Encounter (Signed)
Please advise 

## 2017-08-14 NOTE — Telephone Encounter (Signed)
Called and gave her herpes results   Explained option to test for herpes,   Discussed our inability to determine any implications about how she got this from testing.   Recommended treatment with valtrex.

## 2017-08-16 MED ORDER — FLUCONAZOLE 150 MG OR TABS
ORAL_TABLET | ORAL | 0 refills | Status: DC
Start: 2017-08-16 — End: 2017-09-11

## 2017-08-20 NOTE — Telephone Encounter (Signed)
Taken care of by colleague. Closing TE .

## 2017-08-21 ENCOUNTER — Encounter (INDEPENDENT_AMBULATORY_CARE_PROVIDER_SITE_OTHER): Payer: Self-pay | Admitting: Family

## 2017-08-21 ENCOUNTER — Ambulatory Visit (HOSPITAL_BASED_OUTPATIENT_CLINIC_OR_DEPARTMENT_OTHER): Payer: Medicare HMO | Admitting: Urology

## 2017-08-24 ENCOUNTER — Telehealth (INDEPENDENT_AMBULATORY_CARE_PROVIDER_SITE_OTHER): Payer: Self-pay | Admitting: Family

## 2017-08-24 DIAGNOSIS — B349 Viral infection, unspecified: Secondary | ICD-10-CM

## 2017-08-24 LAB — HERPES TYPE 1 & 2 SEROLOGY
HSV1 WB Result: POSITIVE
HSV2 WB Result: POSITIVE

## 2017-08-24 MED ORDER — VALACYCLOVIR HCL 500 MG OR TABS
500.0000 mg | ORAL_TABLET | Freq: Every day | ORAL | 5 refills | Status: DC
Start: 2017-08-24 — End: 2017-10-17

## 2017-08-24 NOTE — Telephone Encounter (Signed)
Called Cassie Contreras and gave her test results.+Herpes 1 and 2.

## 2017-09-03 ENCOUNTER — Telehealth (HOSPITAL_BASED_OUTPATIENT_CLINIC_OR_DEPARTMENT_OTHER): Payer: Self-pay | Admitting: Internal Medicine

## 2017-09-03 NOTE — Telephone Encounter (Signed)
(  TEXTING IS AN OPTION FOR [facility] CLIN[facility] ONLY)  Is this a [facility] clinic? No      RETURN CALL: OK to leave detailed message with anyone that answers      SUBJECT:  General Message     REASON FOR REQUEST: Patient has a 09/13/2017 appointment that needs to be changed. Please review and change to a time where the patient can make it.

## 2017-09-04 ENCOUNTER — Encounter (HOSPITAL_BASED_OUTPATIENT_CLINIC_OR_DEPARTMENT_OTHER): Payer: Self-pay

## 2017-09-04 NOTE — Telephone Encounter (Signed)
Returned patient and rescheduled her HOME TEST on 9/26 - TE CLOSED

## 2017-09-11 ENCOUNTER — Encounter (INDEPENDENT_AMBULATORY_CARE_PROVIDER_SITE_OTHER): Payer: Self-pay | Admitting: Family

## 2017-09-11 DIAGNOSIS — B3731 Acute candidiasis of vulva and vagina: Secondary | ICD-10-CM

## 2017-09-11 DIAGNOSIS — B349 Viral infection, unspecified: Secondary | ICD-10-CM

## 2017-09-11 DIAGNOSIS — B373 Candidiasis of vulva and vagina: Secondary | ICD-10-CM

## 2017-09-11 MED ORDER — FLUCONAZOLE 150 MG OR TABS
ORAL_TABLET | ORAL | 0 refills | Status: DC
Start: 2017-09-11 — End: 2017-10-17

## 2017-09-11 NOTE — Telephone Encounter (Signed)
Fluconazole 150 mg   Written 08/16/17, 0 refills    Routing to provider for approval- pharmacy pended.

## 2017-09-13 ENCOUNTER — Encounter (HOSPITAL_BASED_OUTPATIENT_CLINIC_OR_DEPARTMENT_OTHER): Payer: Medicare HMO

## 2017-09-22 ENCOUNTER — Encounter (INDEPENDENT_AMBULATORY_CARE_PROVIDER_SITE_OTHER): Payer: Self-pay | Admitting: Internal Medicine

## 2017-09-26 ENCOUNTER — Other Ambulatory Visit (INDEPENDENT_AMBULATORY_CARE_PROVIDER_SITE_OTHER): Payer: Self-pay | Admitting: Family Medicine

## 2017-09-26 DIAGNOSIS — N761 Subacute and chronic vaginitis: Secondary | ICD-10-CM

## 2017-09-28 MED ORDER — VAGIFEM 10 MCG VA TABS
10.0000 ug | ORAL_TABLET | VAGINAL | 0 refills | Status: DC
Start: 2017-09-28 — End: 2018-06-04

## 2017-10-02 ENCOUNTER — Telehealth (INDEPENDENT_AMBULATORY_CARE_PROVIDER_SITE_OTHER): Payer: Self-pay

## 2017-10-02 NOTE — Telephone Encounter (Signed)
First Attempt:    Called Richanda Schauman and left a VM message requesting patient to call back if they would like to schedule an AWV exam.    CCR if patient calls back please schedule an AWV exam with PCP anytime after 10/20/2017. Thanks!

## 2017-10-13 ENCOUNTER — Encounter (INDEPENDENT_AMBULATORY_CARE_PROVIDER_SITE_OTHER): Payer: Self-pay | Admitting: Family Medicine

## 2017-10-15 ENCOUNTER — Encounter (HOSPITAL_BASED_OUTPATIENT_CLINIC_OR_DEPARTMENT_OTHER): Payer: Medicare HMO | Admitting: Otolaryngology

## 2017-10-15 ENCOUNTER — Telehealth (INDEPENDENT_AMBULATORY_CARE_PROVIDER_SITE_OTHER): Payer: Self-pay | Admitting: Family Medicine

## 2017-10-15 NOTE — Telephone Encounter (Signed)
Received a call to assist patient in scheduling an appointment with Hei, Darlyn Chamberhomas Kou, MD   For a pulled back muscle and also a medication refill.    Patient is a compass patient and if possible to overbook patient.     Routing to Dr. Maylon CosHei as urgent

## 2017-10-15 NOTE — Telephone Encounter (Signed)
Patient only wishes to be seen by Dr. Maylon CosHei. [facility]     Dr. Maylon CosHei, Please advise.

## 2017-10-15 NOTE — Telephone Encounter (Signed)
Last OV 06/27/17

## 2017-10-15 NOTE — Telephone Encounter (Signed)
Ok for her to see any provider.  Can book with Dr. Maylon CosHei first available.  Ok to use same day/PCP holds.

## 2017-10-15 NOTE — Telephone Encounter (Signed)
Needs OV.  

## 2017-10-15 NOTE — Telephone Encounter (Signed)
Sent ecare to schedule an appointment

## 2017-10-16 NOTE — Telephone Encounter (Signed)
CCR - called clinic for an update on request [facility] She said to add note to TE and she would ask the provider in the morning (he doesn't work [employer]). I will let patient know that the clinic will call her tomorrow morning.

## 2017-10-16 NOTE — Telephone Encounter (Signed)
Ok to schedule in 15 min slot.  Have patient arrive 20 mins prior to appointment (210 pm).

## 2017-10-16 NOTE — Telephone Encounter (Signed)
Ive emailed PCP to get approval.  Stay tuned.

## 2017-10-16 NOTE — Telephone Encounter (Signed)
Placed a hold on tomorrow's appt time at 2:30 PM. This is a 15 min slot. routing to PCP. Ok to use a shorter time or should pt wait until next available 30 min opening?

## 2017-10-16 NOTE — Telephone Encounter (Signed)
LVM for patient notifying her that PCP will see here tomorrow at 2:10 pm.

## 2017-10-17 ENCOUNTER — Encounter (INDEPENDENT_AMBULATORY_CARE_PROVIDER_SITE_OTHER): Payer: Self-pay | Admitting: Family Medicine

## 2017-10-17 ENCOUNTER — Ambulatory Visit (INDEPENDENT_AMBULATORY_CARE_PROVIDER_SITE_OTHER): Payer: Medicare HMO | Admitting: Family Medicine

## 2017-10-17 ENCOUNTER — Encounter (INDEPENDENT_AMBULATORY_CARE_PROVIDER_SITE_OTHER): Payer: Medicare HMO | Admitting: Family Medicine

## 2017-10-17 VITALS — BP 112/73 | HR 74 | Temp 98.8°F | Resp 15 | Wt 150.0 lb

## 2017-10-17 DIAGNOSIS — S335XXD Sprain of ligaments of lumbar spine, subsequent encounter: Secondary | ICD-10-CM

## 2017-10-17 DIAGNOSIS — Z6822 Body mass index (BMI) 22.0-22.9, adult: Secondary | ICD-10-CM

## 2017-10-17 DIAGNOSIS — L709 Acne, unspecified: Secondary | ICD-10-CM

## 2017-10-17 MED ORDER — TRETINOIN 0.05 % EX CREA
TOPICAL_CREAM | Freq: Every evening | CUTANEOUS | 6 refills | Status: DC
Start: 2017-10-17 — End: 2017-11-23

## 2017-10-17 MED ORDER — TRAMADOL HCL 50 MG OR TABS
50.0000 mg | ORAL_TABLET | Freq: Two times a day (BID) | ORAL | 0 refills | Status: DC | PRN
Start: 2017-10-17 — End: 2018-03-22

## 2017-10-17 MED ORDER — CYCLOBENZAPRINE HCL 10 MG OR TABS
10.0000 mg | ORAL_TABLET | Freq: Every evening | ORAL | 0 refills | Status: DC | PRN
Start: 2017-10-17 — End: 2017-10-31

## 2017-10-17 MED ORDER — TRETINOIN 0.05 % EX CREA
TOPICAL_CREAM | Freq: Every evening | CUTANEOUS | 2 refills | Status: DC
Start: 2017-10-17 — End: 2019-04-30

## 2017-10-17 NOTE — Patient Instructions (Signed)
[facility]:  Address: [address] [place], [place] [address]   Phone: [phone]

## 2017-10-17 NOTE — Telephone Encounter (Signed)
Spoke with patient; she received message regarding her appointment and will attend.

## 2017-10-17 NOTE — Progress Notes (Signed)
Reason for visit: medication follow up       Cervical screening/PAP:UTD  Mammo: UTD  Colon Screen: UTD  Have seen a specialist since your last visit: NO     HM Due:   Health Maintenance   Topic Date Due    Zoster Vaccine (2 of 3) 12/28/2010    Medicare Annual Wellness Visit  10/20/2017    Influenza Vaccine (1) 10/23/2017    Depression Screening (PHQ-2)  06/21/2018    Breast Cancer Screening  11/30/2018    Lipid Disorders Screening  11/09/2020    Tetanus Vaccine  03/24/2022    Colorectal Cancer Screening (Colonoscopy)  02/23/2025    Osteoporosis Screening  Completed    Pneumococcal Vaccine: 65+ years  Completed    Hepatitis C Screening  Completed    Pneumococcal Vaccine: Pediatrics (0-5 years) and At-Risk Patients (6-64 years)  Aged Out           Future Appointments   Date Time Provider Department Center   10/18/2017  3:00 PM [facility]   11/08/2017  1:00 PM Hei, Darlyn Chamber, MD [facility]

## 2017-10-17 NOTE — Telephone Encounter (Signed)
Patient has appointment scheduled today with PCP.

## 2017-10-18 ENCOUNTER — Ambulatory Visit (HOSPITAL_BASED_OUTPATIENT_CLINIC_OR_DEPARTMENT_OTHER): Payer: Medicare HMO | Attending: Sleep Medicine

## 2017-10-18 DIAGNOSIS — G4739 Other sleep apnea: Secondary | ICD-10-CM | POA: Insufficient documentation

## 2017-10-18 DIAGNOSIS — R0683 Snoring: Secondary | ICD-10-CM

## 2017-10-18 DIAGNOSIS — G4730 Sleep apnea, unspecified: Secondary | ICD-10-CM | POA: Insufficient documentation

## 2017-10-20 NOTE — Progress Notes (Signed)
ASSESSMENT/PLAN:  (S33.5XXD) Lumbar sprain, subsequent encounter  (primary encounter diagnosis)  Plan: traMADol 50 MG Oral Tablet, cyclobenzaprine 10         MG Oral Tablet        Meds as rx'd. Side effects reviewed.     (L70.9) Acne, unspecified acne type  Plan: tretinoin (RETIN-A) 0.05 % External Cream,         tretinoin 0.05 % External Cream        every other day use at most given sensitivity       END OF VISIT SUMMARY  PLEASE SEE BELOW FOR SUPPORTING DOCUMENTATION AND DETAILS.   -----------------------------------------------------------------------------------------------------------------    SUBJECTIVE:  Cassie Contreras is a 67 year old female presenting today for the following issue(s):    Here for refill on meds before a trip to [facility], didn't want to be caught without help if back pain flares.  Only taking flexeril at night given sedation side effects.      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  No acute distress.  Blood pressure 112/73, pulse 74, temperature 98.8 F (37.1 C), temperature source Temporal, resp. rate 15, weight 150 lb (68 kg), SpO2 100 %.    deferred    -----------------------------------------------------------------------------------------------------------------  ALSO SEE THE BEGINNING OF THIS NOTE FOR ASSESSMENT AND PLAN  -----------------------------------------------------------------------------------------------------------------

## 2017-10-22 ENCOUNTER — Telehealth (INDEPENDENT_AMBULATORY_CARE_PROVIDER_SITE_OTHER): Payer: Self-pay | Admitting: Family Medicine

## 2017-10-22 ENCOUNTER — Other Ambulatory Visit (HOSPITAL_BASED_OUTPATIENT_CLINIC_OR_DEPARTMENT_OTHER): Payer: Self-pay | Admitting: Pediatrics

## 2017-10-22 DIAGNOSIS — G4733 Obstructive sleep apnea (adult) (pediatric): Secondary | ICD-10-CM

## 2017-10-22 NOTE — Telephone Encounter (Signed)
Demographics submitted via Navinet.  Will check for clinical questions within the next 24 hours.    Key: [facility]

## 2017-10-22 NOTE — Telephone Encounter (Signed)
Received PA request for Cyclobenzaprine HCl 10MG  tablets    Requesting Pharmacy: [facility]    Phone # [phone]   Fax # [phone]       Reason for PA: PA Required        Insurance Name: [employer]   Phone # [phone]   [facility] # [id number]   Member ID # [id number]

## 2017-10-25 NOTE — Telephone Encounter (Signed)
Denial Received  Medication: Cyclobenzaprine HCl 10MG  tablets     Reason for Denial:    Date of Denial: 10/23/2017   Insurance Company: [employer]    Prior Auth Number:      *denial noted via Navinet, however fax line was not changed and letter was sent to the clinic.   Awaiting letter of denial for further information       To Provider:     Formulary Alternatives       Excerpt of Denial                            Appeal Information

## 2017-10-30 ENCOUNTER — Encounter (HOSPITAL_BASED_OUTPATIENT_CLINIC_OR_DEPARTMENT_OTHER): Payer: Self-pay | Admitting: Diagnostic Radiology

## 2017-10-31 NOTE — Telephone Encounter (Signed)
Patient paid for med out of pocket- works very well. Only takes as needed- states the supply she has should last her for years, and that OOP cost wasn't that bad.     Routing to provider as Lorain Childes. If nothing further needed, please close TE.

## 2017-10-31 NOTE — Telephone Encounter (Signed)
Please call patient to see if she feels she still needs some muscle relaxer since the Flexeril was not approved by her insurance.

## 2017-11-01 ENCOUNTER — Inpatient Hospital Stay: Payer: Self-pay

## 2017-11-07 ENCOUNTER — Telehealth (INDEPENDENT_AMBULATORY_CARE_PROVIDER_SITE_OTHER): Payer: Self-pay | Admitting: Internal Medicine

## 2017-11-07 DIAGNOSIS — G4733 Obstructive sleep apnea (adult) (pediatric): Secondary | ICD-10-CM

## 2017-11-07 NOTE — Telephone Encounter (Signed)
Please see below. I will follow her request. I would advise in lab, but per request odrered repeat OCST. Please fully explain, schedule study-and close te unless further issues      [name]     Patient has her HOME TEST done And it was inconclusive . Called her to scheduled an inlab but she stated that she wants to do another HOME TEST since the first was inconclusive . Please advise.     Cassie Contreras

## 2017-11-08 ENCOUNTER — Encounter (INDEPENDENT_AMBULATORY_CARE_PROVIDER_SITE_OTHER): Payer: Self-pay | Admitting: Family Medicine

## 2017-11-08 ENCOUNTER — Ambulatory Visit (INDEPENDENT_AMBULATORY_CARE_PROVIDER_SITE_OTHER): Payer: Medicare HMO | Admitting: Family Medicine

## 2017-11-08 VITALS — BP 113/75 | HR 69 | Temp 97.0°F | Resp 18 | Ht 68.5 in | Wt 148.0 lb

## 2017-11-08 DIAGNOSIS — J329 Chronic sinusitis, unspecified: Secondary | ICD-10-CM

## 2017-11-08 DIAGNOSIS — Z1231 Encounter for screening mammogram for malignant neoplasm of breast: Secondary | ICD-10-CM

## 2017-11-08 DIAGNOSIS — Z6822 Body mass index (BMI) 22.0-22.9, adult: Secondary | ICD-10-CM

## 2017-11-08 DIAGNOSIS — Z1239 Encounter for other screening for malignant neoplasm of breast: Secondary | ICD-10-CM

## 2017-11-08 DIAGNOSIS — Z0000 Encounter for general adult medical examination without abnormal findings: Secondary | ICD-10-CM

## 2017-11-08 DIAGNOSIS — Z23 Encounter for immunization: Secondary | ICD-10-CM

## 2017-11-08 LAB — COMPREHENSIVE METABOLIC PANEL
ALT (GPT): 22 U/L (ref 7–33)
AST (GOT): 24 U/L (ref 9–38)
Albumin: 4.3 g/dL (ref 3.5–5.2)
Alkaline Phosphatase (Total): 79 U/L (ref 38–172)
Anion Gap: 9 (ref 4–12)
Bilirubin (Total): 0.4 mg/dL (ref 0.2–1.3)
Calcium: 9.4 mg/dL (ref 8.9–10.2)
Carbon Dioxide, Total: 26 meq/L (ref 22–32)
Chloride: 100 meq/L (ref 98–108)
Creatinine: 0.66 mg/dL (ref 0.38–1.02)
GFR, Calc, African American: >60 mL/min/{1.73_m2} (ref >59)
GFR, Calc, European American: >60 mL/min/{1.73_m2} (ref >59)
Glucose: 84 mg/dL (ref 62–125)
Potassium: 4.7 meq/L (ref 3.6–5.2)
Protein (Total): 6.8 g/dL (ref 6.0–8.2)
Sodium: 135 meq/L (ref 135–145)
Urea Nitrogen: 15 mg/dL (ref 8–21)

## 2017-11-08 LAB — LIPID PANEL
Cholesterol (LDL): 88 mg/dL (ref <130)
Cholesterol/HDL Ratio: 2.1
HDL Cholesterol: 92 mg/dL (ref >39)
Non-HDL Cholesterol: 102 mg/dL (ref 0–159)
Total Cholesterol: 194 mg/dL (ref <200)
Triglyceride: 71 mg/dL (ref <150)

## 2017-11-08 MED ORDER — AMOXICILLIN 875 MG OR TABS
875.0000 mg | ORAL_TABLET | Freq: Two times a day (BID) | ORAL | 0 refills | Status: AC
Start: 2017-11-08 — End: 2017-11-18

## 2017-11-08 NOTE — Telephone Encounter (Signed)
Called patient Cassie Contreras and lvm to call us back to schedule Sleep Study.     Gave patient direct clinic number: [phone]

## 2017-11-08 NOTE — Progress Notes (Signed)
Vaccine Screening Questions    Interpreter: No    1. Are you allergic to Latex? NO    2.  Have you had a serious reaction or an allergic reaction to a vaccine?  YES:  Pneumonia shot--Fever    3.  Currently have a moderate or severe illness, including fever?  NO    4.  Ever had a seizure or any neurological problem associated with a vaccine? (DTaP/TDaP/DTP pertinent) NO    5.  Is patient receiving any live vaccinations today? (Varicella-Chickenpox, MMR-Measles/Mumps/Rubella, Zoster-Shingles, Flumist, Yellow Fever) NOTE: oral rotavirus is exempt  NO    If YES to any of the questions above - Do NOT give vaccine.  Consult with RN or provider in clinic.  (#5 can be YES if all Live vaccine questions are answered NO)    If NO to all questions above - Patient may receive vaccine.    6.  Do you need to receive the Flu vaccine today? YES - Additional Flu Questions  Flu Vaccine Screening Questions:    Ever had a serious allergic reaction to eggs?  NO    Ever had Guillain-Barre syndrome associated with a vaccine? NO    Less than 6 months old? NO    If YES to any of the Flu questions above - NO Flu Vaccine to be given.  Patient may consult provider as needed.    If NO to all questions above - Patient may receive Flu Shot (IM)    Is the patient requesting Flumist? NO    If between 6 months and 23 years of age, was flu vaccine received last year?  N/A  If NO to above question:   Children who are receiving influenza vaccine for the first time - administer 2 doses of the current influenza vaccine (separated by at least 4 weeks).          All patients are encouraged to wait 15 minutes before leaving after receiving any vaccine.    VIS given 11/08/2017 by Rondel Oh.    Vaccine given today without initial adverse effect. YES    Quillian Quince [facility]

## 2017-11-09 NOTE — Progress Notes (Signed)
ANNUAL WELLNESS VISIT    Cassie Contreras is a 67 year old female who presents for an Annual Wellness Visit.   []  Initial   [x]  Subsequent     INFORMATION GATHERING:  The following areas were confirmed with patient/caregiver and/or updated in Epic at this visit (required):    [x]  Past Medical History   []  Past Surgical History   []  Family History   []  Social History    [x]  Current medications and supplements (including vitamins and calcium)   [x]  Allergies    The information below is up to date at the end of this  visit (required):  Review of functional ability, hearing, fall risk and safety, diet, physical activity, and health habits  (via HRA or direct review with patient or caregiver)    The Health Risk Assessment (HRA) for today's visit (required) was completed (check one):   []   as Patient-Entered Questionnaire via eCare (visible in eCare Questionnaire                     Encounter and in Synopsis)   [x]  as paper HRA form, completed by or with the patient or caregiver (reminder:                    paper form, if completed, must be scanned to Media)   []  in HRA template documentation, completed by staff or provider with patient or  caregiver at visit (for use when HRA was not completed prior to rooming)      List of current providers and suppliers (check all that apply):   []   See Care Team Section   []   See EHR Encounters for [facility] Providers involved in care   [x]   Other providers and suppliers outside [facility]: [facility]    Depression screening:  PHQ-2: 0  PHQ-9 (if done):      EXAM:  BP 113/75    Pulse 69    Temp 97 F (36.1 C) (Temporal)    Resp 18    Ht 5' 8.5" (1.74 m)    Wt 148 lb (67.1 kg)    SpO2 97%    BMI 22.18 kg/m   GAIT:    [x]  Normal, stable, independent    []  Abnormal (describe):    As assessed by:     [x]  Direct Observation     []  Timed Up and Go     []  Other:  COGNITION:    [x]  Intact    []  Abnormal (describe):     As assessed by:      [x]  Direct Observation     []  Brief  Cognitive Screen      ADVANCE CARE PLANNING (ACP) (optional)  Advance care planning:   [x]  Patient Accepted   []  Patient Declined     Check all that apply:   [x]  Advance directives are on file in her chart   [x]  Explained & discussed advance directives at this visit   []  Completed advance care planning form(s) at this visit   []  Other Advance Care Planning discussion at this visit (describe):       Time Spent on ACP: []  None       [x]  1-15 minutes      []  16-30 minutes      []  >30 minutes      ASSESSMENT:  Cassie Contreras was seen for her Annual Wellness Visit, including identification of risk factors & conditions that may affect  her health and function in the future.   (Z00.00) Medicare annual wellness visit, subsequent  (primary encounter diagnosis)  Plan: Lipid Panel, Comprehensive Metabolic Panel    (Z23) Need for vaccination  Plan: influenza vaccine quadrivalent PF (adult) 0.5         mL IM - 09811    (Z12.39) Screening for breast cancer  Plan: MAM MAMMOGRAPHY SCREEN W/TOMO BILAT    (Z12.31) Encounter for screening mammogram for malignant neoplasm of breast   Plan: MAM MAMMOGRAPHY SCREEN W/TOMO BILAT    (J32.9) Sinusitis, unspecified chronicity, unspecified location  Plan: Amoxicillin 875 MG Oral Tablet    (Z00.00) Routine general medical examination at a health care facility       Sinusitis: advised to only take abx as rxd' if symptoms don't improve after 4=5 days       Actions at this visit (all 3 required):   []   Establishing or updating a written schedule of screening and prevention  measures recommended and appropriate for Cassie Contreras for the next 5-10 years   []   Establishing or updating a list of her risk factors and conditions for which  lifestyle or medical interventions are recommended or underway, including  mental health risks and conditions, and including risks/benefits of treatment   []   Furnishing personalized health advice and, as appropriate, referrals to health  education or preventive  counseling services or programs (such as fall  prevention, tobacco cessation, physical activity, nutrition, weight loss)      COUNSELING:      Here are screening & prevention measures recommended for you:  Health Maintenance   Topic Date Due    Zoster Vaccine (2 of 3) 12/28/2010    Medicare Annual Wellness Visit  10/20/2017    Depression Screening (PHQ-2)  11/09/2018    Breast Cancer Screening  11/30/2018    Tetanus Vaccine  03/24/2022    Lipid Disorders Screening  11/09/2022    Colorectal Cancer Screening (Colonoscopy)  02/23/2025    Osteoporosis Screening  Completed    Pneumococcal Vaccine: 65+ years  Completed    Influenza Vaccine  Completed    Hepatitis C Screening  Completed    Pneumococcal Vaccine: Pediatrics (0-5 years) and At-Risk Patients (6-64 years)  Aged Out       Based on todays evaluation, I recommend the following ways to improve your health or functioning:      You have the following risk factors and/or medical conditions for which there are recommended ways (included in the list) to help you stay as healthy as possible:  1.    monitor for memory isues vs confusion  2.  Stay active       Please plan to have a Subsequent Annual Wellness Visit in 1 year.

## 2017-11-15 ENCOUNTER — Encounter (HOSPITAL_BASED_OUTPATIENT_CLINIC_OR_DEPARTMENT_OTHER): Payer: Self-pay

## 2017-11-21 NOTE — Progress Notes (Signed)
Department: Population Health Management  Insurer: [employer]   Outreach purpose: AWV SQ [phone]     No patient outreach needed based on thorough chart review. AWV visit already completed by PCP on November 08 2017.   To schedule or if there are any questions, patient can call the [facility] Medicine Contact Center team at [phone].     Closing Encounter.

## 2017-11-23 ENCOUNTER — Telehealth (INDEPENDENT_AMBULATORY_CARE_PROVIDER_SITE_OTHER): Payer: Self-pay | Admitting: Family Medicine

## 2017-11-23 ENCOUNTER — Ambulatory Visit (INDEPENDENT_AMBULATORY_CARE_PROVIDER_SITE_OTHER): Payer: Medicare HMO | Admitting: Family Medicine

## 2017-11-23 ENCOUNTER — Encounter (INDEPENDENT_AMBULATORY_CARE_PROVIDER_SITE_OTHER): Payer: Self-pay | Admitting: Family Medicine

## 2017-11-23 ENCOUNTER — Ambulatory Visit (INDEPENDENT_AMBULATORY_CARE_PROVIDER_SITE_OTHER): Payer: Self-pay | Admitting: Family Medicine

## 2017-11-23 VITALS — BP 137/82 | HR 86 | Temp 98.9°F | Resp 12 | Ht 68.5 in | Wt 150.0 lb

## 2017-11-23 DIAGNOSIS — Z6822 Body mass index (BMI) 22.0-22.9, adult: Secondary | ICD-10-CM

## 2017-11-23 DIAGNOSIS — J00 Acute nasopharyngitis [common cold]: Secondary | ICD-10-CM

## 2017-11-23 MED ORDER — GUAIFENESIN-CODEINE 100-10 MG/5ML OR SYRP
5.0000 mL | ORAL_SOLUTION | Freq: Four times a day (QID) | ORAL | 0 refills | Status: AC | PRN
Start: 2017-11-23 — End: 2017-11-28

## 2017-11-23 NOTE — Telephone Encounter (Signed)
Abbey reports in phrases sounding short of breath caused by coughing fits, that she has a productive cough and declines my advice to be seen within three days, wants to be prescribed codeine cough syrup and agrees to a call back today.    Reason for Disposition   Cough has been present for > 10 days    Additional Information   Negative: Severe difficulty breathing (e.g., struggling for each breath, speaks in single words)   Negative: Sounds like a life-threatening emergency to the triager   Negative: Difficulty breathing and not from stuffy nose (e.g., not relieved by cleaning out the nose)   Negative: SEVERE headache and fever   Negative: Taking antibiotic > 24 hours and fever > 103 F (39.4 C)   Negative: Redness or swelling on the cheek, forehead or around the eye and fever   Negative: Patient sounds very sick or weak to the triager   Negative: Bluish lips, tongue, or face now   Negative: Severe difficulty breathing (e.g., struggling for each breath, speaks in single words)   Negative: Rapid onset of cough and has hives   Negative: Coughing started suddenly after medicine, an allergic food or bee sting   Negative: Difficulty breathing after exposure to flames, smoke, or fumes   Negative: Sounds like a life-threatening emergency to the triager   Negative: Chest pain present when not coughing   Negative: Difficulty breathing   Negative: Passed out (i.e., fainted, collapsed and was not responding)   Negative: Patient sounds very sick or weak to the triager    Protocols used: COUGH-ADULT-OH, SINUS INFECTION ON ANTIBIOTIC FOLLOW-UP CALL-ADULT-OH    Laurena Spies, RN 11/23/17 1:35 PM    TELEPHONE TRIAGE ENCOUNTER CREATED TO ADDRESS THIS MESSAGE.  Laurena Spies, RN 11/23/17 at 1:08 PM   ----- Message -----   From: [name]   Sent: 11/1/20196:02 AM PDT   To: Vinnie Langton, MD  Subject: Sinus infection    Hi Dr. [name],  I filled and took the 10 day prescription you gave me for the  sinus infection.I thought I was doing much better but am now coughing up greenish/grey phlegm particularly in the early morning 4-5 a.m.The early morning head ache is back, no fever but feeling sometimes hot sometimes cold.Generally continue to feel lousy.I have some codeine cough medicine and some expectorant left from last time I was sick and took it last night.Also, still have inhaler from last time and used it last night, too, because the cough seemed to make me gasp last evening.I slept pretty good but still woke up at 4:30 a.m. coughing violently and producing gross phlegm.I'm assuming I got a cold on top of the sinus infection and it has gone to my chest?Shall I continue what I'm doing?If so, please renew cough medicine as there isn't much left.Thank you, Carolina Sink

## 2017-11-23 NOTE — Telephone Encounter (Signed)
Dannelle reports in phrases sounding short of breath caused by coughing fits, that she has a productive cough and declines my advice to be seen within three days, wants to be prescribed codeine cough syrup and agrees to a call back today.    Reason for Disposition  • Cough has been present for > 10 days    Additional Information  • Negative: Severe difficulty breathing (e.g., struggling for each breath, speaks in single words)  • Negative: Sounds like a life-threatening emergency to the triager  • Negative: Difficulty breathing and not from stuffy nose (e.g., not relieved by cleaning out the nose)  • Negative: SEVERE headache and fever  • Negative: Taking antibiotic > 24 hours and fever > 103 F (39.4 C)  • Negative: Redness or swelling on the cheek, forehead or around the eye and fever  • Negative: Patient sounds very sick or weak to the triager  • Negative: Bluish lips, tongue, or face now  • Negative: Severe difficulty breathing (e.g., struggling for each breath, speaks in single words)  • Negative: Rapid onset of cough and has hives  • Negative: Coughing started suddenly after medicine, an allergic food or bee sting  • Negative: Difficulty breathing after exposure to flames, smoke, or fumes  • Negative: Sounds like a life-threatening emergency to the triager  • Negative: Chest pain present when not coughing  • Negative: Difficulty breathing  • Negative: Passed out (i.e., fainted, collapsed and was not responding)  • Negative: Patient sounds very sick or weak to the triager    Protocols used: COUGH-ADULT-OH, SINUS INFECTION ON ANTIBIOTIC FOLLOW-UP CALL-ADULT-OH    Thoams Siefert, RN 11/23/17 1:35 PM    TELEPHONE TRIAGE ENCOUNTER CREATED TO ADDRESS THIS MESSAGE.  Gavriella Hearst, RN 11/23/17 at 1:08 PM   ----- Message -----     From: Charissa Mae Disch     Sent: 11/23/2017  6:02 AM PDT       To: Thomas K Hei, MD  Subject: Sinus infection    Hi Dr. Hei,  I filled and took the 10 day prescription you gave me for the  sinus infection.  I thought I was doing much better but am now coughing up greenish/grey phlegm particularly in the early morning 4-5 a.m.  The early morning head ache is back, no fever but feeling sometimes hot sometimes cold.  Generally continue to feel lousy.  I have some codeine cough medicine and some expectorant left from last time I was sick and took it last night.  Also, still have inhaler from last time and used it last night, too, because the cough seemed to make me gasp last evening.  I slept pretty good but still woke up at 4:30 a.m. coughing violently and producing gross phlegm.  I'm assuming I got a cold on top of the sinus infection and it has gone to my chest?  Shall I continue what I'm doing?  If so, please renew cough medicine as there isn't much left.  Thank you, Navil

## 2017-11-23 NOTE — Telephone Encounter (Signed)
Patient seen in UC today.

## 2017-11-23 NOTE — Patient Instructions (Signed)
Patient Education     Viral Upper Respiratory Illness (Adult)  You have a viral upper respiratory illness (URI), which is another term for the common cold. This illness is contagious during the first few days. It is spread through the air by coughing and sneezing. It may also be spread by direct contact (touching the sick person and then touching your own eyes, nose, or mouth). Frequent handwashing will decrease risk of spread. Most viral illnesses go away within 7 to 10 days with rest and simple home remedies. Sometimes the illness may last for several weeks. Antibiotics will not kill a virus, and they are generally not prescribed for this condition.    Home care  · If symptoms are severe, rest at home for the first 2 to 3 days. When you resume activity, don't let yourself get too tired.  · Don't smoke. If you need help stopping, talk with your healthcare provider.  · Avoid being exposed to cigarette smoke (yours or others’).  · You may use acetaminophen or ibuprofen to control pain and fever, unless another medicine was prescribed. If you have chronic liver or kidney disease, have ever had a stomach ulcer or gastrointestinal bleeding, or are taking blood-thinning medicines, talk with your healthcare provider before using these medicines. Aspirin should never be given to anyone under 18 years of age who is ill with a viral infection or fever. It may cause severe liver or brain damage.  · Your appetite may be poor, so a light diet is fine. Stay well hydrated by drinking 6 to 8 glasses of fluids per day (water, soft drinks, juices, tea, or soup). Extra fluids will help loosen secretions in the nose and lungs.  · Over-the-counter cold medicines will not shorten the length of time you’re sick, but they may be helpful for the following symptoms: cough, sore throat, and nasal and sinus congestion. If you take prescription medicines, ask your healthcare provider or pharmacist which over-the-counter medicines are safe to  use. (Note: Don't use decongestants if you have high blood pressure.)  Follow-up care  Follow up with your healthcare provider, or as advised.  When to seek medical advice  Call your healthcare provider right away if any of these occur:  · Cough with lots of colored sputum (mucus)  · Severe headache; face, neck, or ear pain  · Difficulty swallowing due to throat pain  · Fever of 100.4°F (38°C) or higher, or as directed by your healthcare provider  Call 911  Call 911 if any of these occur:  · Chest pain, shortness of breath, wheezing, or difficulty breathing  · Coughing up blood  · Very severe pain with swallowing, especially if it goes along with a muffled voice   Date Last Reviewed: 06/23/2016  © 2000-2018 [employer] [address], [place] [address]. All rights reserved. This information is not intended as a substitute for professional medical care. Always follow your healthcare professional's instructions.

## 2017-11-23 NOTE — Progress Notes (Signed)
Patient Referred By: No ref. provider found  Patient's PCP: Hei, Darlyn Chamber, MD  [facility]    Urgent Care Family Medicine Note    Chief Complaint    [name] is a 67 year old year old female who presents to [facility] for   Chief Complaint   Patient presents with    Cough     cough, congestion x 1 week        Subjective:  Patient is a 67 year old female, here to discuss Cough (cough, congestion x 1 week)        Cough   This is a new problem. The current episode started in the past 7 days. The problem has been gradually worsening. The problem occurs every few minutes. The cough is productive of sputum. Pertinent negatives include no chills, fever, headaches or wheezing. Nothing aggravates the symptoms. She has tried prescription cough suppressant and a beta-agonist inhaler for the symptoms. The treatment provided moderate relief.       Review of Systems   Constitutional: Negative for chills and fever.   Respiratory: Positive for cough. Negative for wheezing.    Allergic/Immunologic: Negative for immunocompromised state.   Neurological: Negative for dizziness and headaches.   Hematological: Does not bruise/bleed easily.   Psychiatric/Behavioral: Negative for agitation and confusion.         Objective:  Physical Exam  Vitals signs reviewed.   Constitutional:       General: She is not in acute distress.     Appearance: She is well-developed. She is not diaphoretic.   HENT:      Head: Normocephalic and atraumatic.      Right Ear: External ear normal.      Left Ear: External ear normal.      Mouth/Throat:      Pharynx: No oropharyngeal exudate.   Eyes:      General:         Right eye: No discharge.         Left eye: No discharge.      Conjunctiva/sclera: Conjunctivae normal.      Pupils: Pupils are equal, round, and reactive to light.   Neck:      Musculoskeletal: Normal range of motion and neck supple.   Cardiovascular:      Rate and Rhythm: Normal rate and  regular rhythm.      Heart sounds: Normal heart sounds. No murmur.   Pulmonary:      Effort: Pulmonary effort is normal.      Breath sounds: Normal breath sounds. No wheezing.   Musculoskeletal: Normal range of motion.   Lymphadenopathy:      Cervical: No cervical adenopathy.   Skin:     General: Skin is warm and dry.      Capillary Refill: Capillary refill takes less than 2 seconds.      Findings: No rash.   Neurological:      Mental Status: She is alert and oriented to person, place, and time.      Cranial Nerves: No cranial nerve deficit.      Deep Tendon Reflexes: Reflexes are normal and symmetric.   Psychiatric:         Behavior: Behavior normal.         Thought Content: Thought content normal.         Judgement: Judgment normal.          Assessment and Plan:   Diagnoses  and all orders for this visit:    Acute nasopharyngitis  -     guaiFENesin-codeine 100-10 MG/5ML syrup; Take 5 mL by mouth every 6 hours as needed for cough for up to 5 days.    ED, infection and medication precautions discussed. Expect improvement in 2-3 days. FU prn. Pt encouraged to ask questions. And risks and benefits of treatment plan discussed.    Advised pt that in the future she may not get Rx for codeine syrup from UC provider and that she may need to see PCP. Possible over use.

## 2017-11-23 NOTE — Telephone Encounter (Signed)
TELEPHONE TRIAGE ENCOUNTER CREATED TO ADDRESS THIS MESSAGE.  Laurena Spies, RN 11/23/17 at 1:08 PM

## 2017-11-28 ENCOUNTER — Encounter (INDEPENDENT_AMBULATORY_CARE_PROVIDER_SITE_OTHER): Payer: Medicare HMO | Admitting: Family Medicine

## 2017-11-30 ENCOUNTER — Ambulatory Visit: Payer: Medicare HMO | Attending: Family Medicine

## 2017-11-30 DIAGNOSIS — Z1239 Encounter for other screening for malignant neoplasm of breast: Secondary | ICD-10-CM

## 2017-11-30 DIAGNOSIS — Z1231 Encounter for screening mammogram for malignant neoplasm of breast: Secondary | ICD-10-CM

## 2017-12-05 ENCOUNTER — Encounter (INDEPENDENT_AMBULATORY_CARE_PROVIDER_SITE_OTHER): Payer: Self-pay | Admitting: Family Medicine

## 2017-12-05 ENCOUNTER — Ambulatory Visit (INDEPENDENT_AMBULATORY_CARE_PROVIDER_SITE_OTHER): Payer: Medicare HMO | Admitting: Family Medicine

## 2017-12-05 VITALS — BP 138/85 | HR 71 | Temp 98.5°F | Resp 14 | Ht 68.0 in | Wt 148.0 lb

## 2017-12-05 DIAGNOSIS — F430 Acute stress reaction: Secondary | ICD-10-CM

## 2017-12-05 DIAGNOSIS — Z6822 Body mass index (BMI) 22.0-22.9, adult: Secondary | ICD-10-CM

## 2017-12-05 DIAGNOSIS — J0100 Acute maxillary sinusitis, unspecified: Secondary | ICD-10-CM

## 2017-12-05 MED ORDER — AZITHROMYCIN 250 MG OR TABS
ORAL_TABLET | ORAL | 0 refills | Status: DC
Start: 2017-12-05 — End: 2018-03-22

## 2017-12-05 NOTE — Progress Notes (Signed)
ASSESSMENT/PLAN:  (J01.00) Acute non-recurrent maxillary sinusitis  (primary encounter diagnosis)  Plan: azithromycin 250 MG tablet        Given chronicity/duration will tx w abx since early in course took amox.  Neti pot flushes recommended as well.    (F43.0) Stress reaction  Plan: discussed issues and self-care including CBT techniques,  Recommended 2 books.       END OF VISIT SUMMARY  PLEASE SEE BELOW FOR SUPPORTING DOCUMENTATION AND DETAILS.   -----------------------------------------------------------------------------------------------------------------    SUBJECTIVE:  Cassie Contreras is a 67 year old female presenting today for the following issue(s):    Persistent URI symptoms of thick mucus/sputum of nasal sinuses and throat, cough and generalized fatigue for 4-6 weeks.  Recent [facility] visit note reviewed.  Using both fluticasone nasal spray and budesonide inhaler with modest help.  Cough wakes her up at night.  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  No acute distress.  Blood pressure 138/85, pulse 71, temperature 98.5 F (36.9 C), temperature source Temporal, resp. rate 14, height 5\' 8"  (1.727 m), weight 148 lb (67.1 kg), SpO2 99 %.   HEENT: Lids, sclerae and conjunctivae clear. External ears and canals clear. TM's normal bilaterally. Moderately boggy turbinates with clear discharge. mild bilateral maxillary sinus tenderness. Oropharynx normal.  NECK: supple without palpable adenopathy. No thyroid enlargement or discrete nodule.  LUNGS: end insp wheeznig bilaterally  PSYCH: Makes good eye contact, dressed and groomed neatly and casually, tells a logical linear hx with no signs of thought d/o.     Over the last 2 weeks how often have you been bothered by any of the following problems?  1. Little interest or pleasure in doing things: (!) Nearly every day  2. Feeling down, depressed or hopeless: Several days  3. Trouble falling or staying asleep, or sleeping too much: (!) Nearly every day  4. Feeling tired or having little energy : (!) Nearly every day  5. Poor appetite or overeating: Several days  6. Feeling bad about  yourself - or that you are a failure or have let yourself or your family down: Several days  7. Trouble concentrating on things, such as reading the newspaper or watching television: Several days  8. Moving or speaking much more slowly than usual.  Or the opposite - fidgety or restless: Not at all  9. Thoughts that you would be better off dead or of hurting yourself in some way: Not at all    Total  PHQ9 Total Score: 13    10. If you checked off any problems, how difficult have these problems made it for you to do your work, take care of things at home, or get along with other people?: Somewhat difficult     GAD7 Score, Total:  12  GAD7 Score, Difficulty: If you checked any problems, how difficult have they made it for you to do your work, take care of things at home, or get along with other people?: Somewhat difficult       -----------------------------------------------------------------------------------------------------------------  ALSO SEE THE BEGINNING OF THIS NOTE FOR ASSESSMENT AND PLAN  -----------------------------------------------------------------------------------------------------------------

## 2018-01-03 ENCOUNTER — Ambulatory Visit (HOSPITAL_BASED_OUTPATIENT_CLINIC_OR_DEPARTMENT_OTHER): Payer: Medicare HMO | Attending: Sleep Medicine

## 2018-01-03 DIAGNOSIS — G4733 Obstructive sleep apnea (adult) (pediatric): Secondary | ICD-10-CM

## 2018-01-11 ENCOUNTER — Other Ambulatory Visit (HOSPITAL_BASED_OUTPATIENT_CLINIC_OR_DEPARTMENT_OTHER): Payer: Self-pay | Admitting: Pediatrics

## 2018-01-11 DIAGNOSIS — G4733 Obstructive sleep apnea (adult) (pediatric): Secondary | ICD-10-CM

## 2018-01-14 ENCOUNTER — Encounter (HOSPITAL_BASED_OUTPATIENT_CLINIC_OR_DEPARTMENT_OTHER): Payer: Self-pay | Admitting: Internal Medicine

## 2018-01-14 ENCOUNTER — Encounter (HOSPITAL_BASED_OUTPATIENT_CLINIC_OR_DEPARTMENT_OTHER): Payer: Self-pay

## 2018-01-14 NOTE — Progress Notes (Signed)
FAXED RX AND SUPPORTING DOCUMENTS TO [facility] [phone] FOR CPAP/BIPAP NEW SET UP ON 12/23

## 2018-01-18 ENCOUNTER — Encounter (HOSPITAL_BASED_OUTPATIENT_CLINIC_OR_DEPARTMENT_OTHER): Payer: Self-pay

## 2018-02-18 ENCOUNTER — Telehealth (HOSPITAL_BASED_OUTPATIENT_CLINIC_OR_DEPARTMENT_OTHER): Payer: Self-pay | Admitting: Pulmonary Disease

## 2018-02-18 ENCOUNTER — Telehealth (HOSPITAL_BASED_OUTPATIENT_CLINIC_OR_DEPARTMENT_OTHER): Payer: Self-pay | Admitting: Internal Medicine

## 2018-02-18 NOTE — Telephone Encounter (Signed)
RETURN CALL: Voicemail - General Message      SUBJECT:  Appointment Request     REASON FOR VISIT: c- pap maching  PREFERRED DATE/TIME: next opening  ADDITIONAL INFORMATION: patient believed she had an appointment scheduled for today, how ever patient has a sleep referral for speech therapy but wants a c- pap test and machine to take home . Please call patient to discuss care plan and offer a appointment.

## 2018-02-18 NOTE — Telephone Encounter (Signed)
Patient called in again regarding the request for a same day appointment. CCR attempted to warm transfer but clinic was serving other patients. OK to leave voice mail on patient's phone if unable to reach her.

## 2018-02-18 NOTE — Telephone Encounter (Signed)
Clinic informed NWM/Dick - set up appointment at 11am today. Cassie Contreras will call her back to r/s - TE CLOSED

## 2018-02-18 NOTE — Telephone Encounter (Signed)
RETURN CALL: Voicemail - Detailed Message      SUBJECT:  General Message     MESSAGE: Patient confirmed sleep study was done in December and recommended to have a machine. Awaiting a ci-pap/bi-pap machine order.     She spoke with someone regarding an appointment today at 1pm. It was unclear if this was with [facility] for machine fitting or [facility] who might have called (since they confirmed location was in [place]). Since [place] was too far, called advised patient to go back to [facility].     Attempted to contact [facility] staff but they were busy helping other patients. Please contact patient asap. Thank you.

## 2018-02-26 ENCOUNTER — Encounter (HOSPITAL_BASED_OUTPATIENT_CLINIC_OR_DEPARTMENT_OTHER): Payer: Self-pay | Admitting: Internal Medicine

## 2018-03-22 ENCOUNTER — Telehealth (INDEPENDENT_AMBULATORY_CARE_PROVIDER_SITE_OTHER): Payer: Self-pay | Admitting: Family Medicine

## 2018-03-22 ENCOUNTER — Encounter (INDEPENDENT_AMBULATORY_CARE_PROVIDER_SITE_OTHER): Payer: Self-pay | Admitting: Family Health

## 2018-03-22 ENCOUNTER — Ambulatory Visit (INDEPENDENT_AMBULATORY_CARE_PROVIDER_SITE_OTHER): Payer: PPO | Admitting: Family Health

## 2018-03-22 VITALS — BP 134/76 | HR 70 | Temp 97.1°F | Resp 14 | Wt 151.0 lb

## 2018-03-22 DIAGNOSIS — N949 Unspecified condition associated with female genital organs and menstrual cycle: Secondary | ICD-10-CM

## 2018-03-22 DIAGNOSIS — F419 Anxiety disorder, unspecified: Secondary | ICD-10-CM

## 2018-03-22 MED ORDER — FLUCONAZOLE 150 MG OR TABS
150.0000 mg | ORAL_TABLET | Freq: Once | ORAL | 0 refills | Status: AC
Start: 2018-03-22 — End: 2018-03-22

## 2018-03-22 MED ORDER — CLONAZEPAM 0.5 MG OR TABS
0.5000 mg | ORAL_TABLET | Freq: Every day | ORAL | 0 refills | Status: DC | PRN
Start: 2018-03-22 — End: 2019-02-19

## 2018-03-22 NOTE — Telephone Encounter (Signed)
Please call and schedule her with Heejung from today.

## 2018-03-22 NOTE — Telephone Encounter (Signed)
Spoke to patient and she said it is not acceptable that she needs to come in and would like the provider to give her a call,she would like the Rx without coming in .

## 2018-03-22 NOTE — Telephone Encounter (Signed)
**  Complete Incoming Call Section as well**    RETURN CALL: General message OK      SUBJECT: General Message     MESSAGE:   Pt calling with yeast infection symptoms. Notes discharge and burning started yesterday. Routing for provider review and advisement. Preferred pharmacy pended. Please advise if you would like pt to speak with RN triage

## 2018-03-22 NOTE — Patient Instructions (Signed)
Patient Education     Vaginal Infection: Yeast (Candidiasis)  Yeast infection occurs when yeast in the vagina increase and attacks the vaginal tissues. Yeast is a type of fungus. These infections are often caused by a type of yeast called Candida albicans. Other species of yeast can also cause infections. Factors that may make infection more likely include recent antibiotic use, douching, or increasedsex. Yeast infections are more common in women who have diabetes, or are obese or pregnant, or have a weak immune system.  Symptoms of yeast infection   Clumpy or thin, white discharge, which may look like cottage cheese   No odor or minimal odor   Severe vaginal itching or burning   Burning with urination   Swelling, redness of vulva   Pain during sex  Treating yeast infection  Yeast infection is treated with a vaginal antifungal cream. In some cases, antifungal pills are prescribed instead. During treatment:   Finish all of your medicine, even if your symptoms go away.   Apply the cream before going to bed. Lie flat after applying so that it doesn't drip out.   Do not douche or use tampons.   Don't rely on a diaphragm or condoms, since the cream may weaken them.   Avoid intercourse if advised by your healthcare provider.    Should I treat a yeast infection myself?  Discuss with your healthcare provider whether you should use over-the-counter medicines to treat a yeast infection. Self-treatment may depend on whether:   You've had a yeast infection in the past.   You're at risk for STDs.  Call your healthcare provider if symptoms do not go away or come back after treatment.   Date Last Reviewed: 03/24/2015   2000-2018 [employer], [place]. [address], [place], [place] [address]. All rights reserved. This information is not intended as a substitute for professional medical care. Always follow your healthcare professional's instructions.           Patient Education     Vaginal Infection: Bacterial  Vaginosis  Both good and bad bacteria are present in a healthy vagina. Bacterial vaginosis (BV) occurs when these bacteria get out of balance. The numbers of good bacteria decrease. This allows the numbers of bad bacteria to increase and cause BV. In most cases, BV is not a serious problem.  Causes of bacterial vaginosis  The cause of BV is not clear. Douching may lead to it. Having sex with a new partner or more than1 partner makes it more likely.  Symptoms of bacterial vaginosis  Symptoms of BV vary for each woman. Some women have few symptoms or none at all. If symptoms are present, they can include:   Thin, milky white or gray or sometimes green discharge   Unpleasant "fishy" odor   Irritation, itching, and burning at opening of vagina which may indicate mixed vaginitis   Burning or irritation with sex or urination which may indicate mixed vaginitis  Diagnosing bacterial vaginosis  Your healthcare provider will ask about your symptoms and health history. He or she will also do a pelvic exam. This is an exam of your vagina and cervix. A sample of vaginal fluid or discharge may be taken. This sample is checked for signs of BV.  Treating bacterial vaginosis  BV is often treated with antibiotics. They may be given in oral pill form or as a vaginal cream. To use these medicines:   Be sure to take all of your medicine, even if your  symptoms go away.   If you're taking antibiotic pills, do not drink alcohol until you're finished with all of your medicine.   If you're using vaginal cream, apply it as directed. Be aware that the cream may make condoms and diaphragms less effective.   Call your healthcare provider if symptoms do not go away within 4 days of starting treatment. Also call if you have a reaction to the medicine.  Why treatment matters  Even if you have no symptoms or your symptoms are mild, BV should be treated. Untreated BV can lead to health problems such as:   Increased risk of preterm  delivery if you're pregnant   Increased risk of complications after surgery on the reproductive organs   Possible increased risk of pelvic inflammatory disease (PID)   Date Last Reviewed: 03/24/2015   2000-2018 [employer] [address], [place], [place] [address]. All rights reserved. This information is not intended as a substitute for professional medical care. Always follow your healthcare professional's instructions.

## 2018-03-22 NOTE — Progress Notes (Signed)
SUBJECTIVE:  Cassie Contreras is a 68 year old female who comes in with concerns about vaginal symptoms. She suspects yeast infection. She also wants to refill her anxiety medication.    1. Vaginal Symptoms  Onset: yesterday.   Symptoms include: burning sensation with white mucous vaginal discharge.  Changes since onset: symptoms got worsened this morning. Denies itching.   Severity: mild discomfort.   Modifying factors: uncertain.   Prior history of similar symptoms? Yes: Had yeast infection in the past. Not itching at this time, but she feels similar discomfort like when she had yeast infection.  Recent antibiotics:No  Self-care/meds: none  Last time patient had sex: 3 weeks ago.   Last STD check: 07/2017  New partner(s) since last STD check: No - monogamous relationship with her husband.     2. Anxiety  The patient states that she has anxiety symptoms, such as racing heart rates, dizzy feelings, and difficulty relaxing/ sleeping at night. 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 She has average 20 tablet for 1 year and used them only for emergency.    I reviewed the patient's recorded medical history and confirmed the medications, problem list, allergies and past medical history.    ROS:  General: Denies fever, chills or unexplained weight loss.  Skin: Reports intermittent genital skin rash due to herpes.   GI: Denies abdominal pain, heartburn, nausea/ vomiting.  GU: Denies urinary discomfort.   Gyn: See HPI. Denies pelvic pain or vaginal bleeding.  MSK: Denies back pain.  Neuro: Denies headache, dizziness.  Psych: Reports anxiety. See HPI.    OBJECTIVE:  General Appearance: Alert and NAD.  Lymph: No lymphadenopathy.  Respiratory: Respirations regular and unlabored. Lungs sound clear to the auscultation.  CV: S1- S2 normal, no murmur, click, rub or gallop.  GI: Soft without tenderness or masses. No hepatosplenomegaly.  Pelvic exam:   - External genitalia normal female hair  distribution. A small pin point rash in the right labia majora. Bartholin's & Skeenes' glands, urethra (BUS) without discharge, tenderness, or swelling.   - Internal genitalia: Vagina pink, well rugated, small amount white discharge, no odor. Cervix smooth, firm and non-tender.     ASSESSMENT AND PLAN:  (N94.9) Vaginal discomfort  (primary encounter diagnosis)  - Discussed about possible causes: yeast infection vs. Bacterial vaginosis.   - Asked the patient if she would like to take yeast medication or wait for the final results. She prefer to take the yeast medication.   - Plan: fluconazole 150 MG tablet. Take 1 tab by mouth one time for 1 dose for possible yeast infection.   - Advised avoid alcohol intake due to drug interaction.   - Pending Labs:    1) R/O Yeast w/Direct    2) Exam, Gram Stain, Vaginal Fluid    (F41.9) Anxiety  - Take 1 tablet (0.5 mg) by mouth daily as needed (anxiety). Disp-20 tablet,R-0  - Discussed about side effects and recommended limited use. She verbalizes her understanding.    Follow-up for persistent or worsening discomfort.     Melony Overly, DNP, ARNP, FNP-BC, CWCN  [facility]  [address], [address]  [place] [address]  [phone]

## 2018-03-23 LAB — VAGINAL GRAM STAIN: Gram Smear: NONE SEEN

## 2018-03-26 NOTE — Telephone Encounter (Signed)
Cancelled appointment

## 2018-03-26 NOTE — Telephone Encounter (Signed)
Please cancel her appt w me tomorrow.

## 2018-03-26 NOTE — Telephone Encounter (Signed)
S/w patient, she's feeling better despite not taking diflucan.  Appt tomorrow w me re CPAP after 30 days, but I don't have the data so will cancel appt w me while I work to get data.

## 2018-03-27 ENCOUNTER — Encounter (INDEPENDENT_AMBULATORY_CARE_PROVIDER_SITE_OTHER): Payer: PPO | Admitting: Family Medicine

## 2018-03-28 ENCOUNTER — Encounter (INDEPENDENT_AMBULATORY_CARE_PROVIDER_SITE_OTHER): Payer: Self-pay | Admitting: Family Medicine

## 2018-03-28 LAB — R/O YEAST CULT W/DIRECT EXAM: Stain For Fungus: NONE SEEN

## 2018-03-28 NOTE — Telephone Encounter (Signed)
FYI to provider

## 2018-03-28 NOTE — Telephone Encounter (Signed)
FYI to provider, would you like pt to come in?

## 2018-06-04 ENCOUNTER — Other Ambulatory Visit (INDEPENDENT_AMBULATORY_CARE_PROVIDER_SITE_OTHER): Payer: Self-pay | Admitting: Family Medicine

## 2018-06-04 DIAGNOSIS — N761 Subacute and chronic vaginitis: Secondary | ICD-10-CM

## 2018-06-05 MED ORDER — VAGIFEM 10 MCG VA TABS
10.0000 ug | ORAL_TABLET | VAGINAL | 1 refills | Status: DC
Start: 2018-06-05 — End: 2018-11-13

## 2018-06-10 ENCOUNTER — Telehealth (INDEPENDENT_AMBULATORY_CARE_PROVIDER_SITE_OTHER): Payer: Self-pay | Admitting: Family Medicine

## 2018-06-10 NOTE — Telephone Encounter (Signed)
Received PA request for Tretinoin 0.05% cream      Requesting Pharmacy: [facility]    Phone # [phone]   Fax # [phone]       Reason for PA: Non formulary        Insurance Name: [employer]    Phone # [phone]   [facility] #    Member ID # [id number]     Demographics submitted via Navinet.  Will check for clinical questions within the next 24 hours.    Key: [id number]

## 2018-06-12 NOTE — Telephone Encounter (Signed)
PA submitted via navinet .  Will check for response in 4 business days.  If no response at that time will call insurance to check status.      Navinet Key: [id number]

## 2018-06-13 NOTE — Telephone Encounter (Signed)
Approval Received  Insurance: [employer]   Medication: Tretinoin 0.05% cream   Quantity / Frequency:    Dates Approved: 03/14/2018-06/12/2019   Prior Auth. Number: [id number]     Additional Notes: Pharmacy notified via fax and closing TE     Excerpt of Approval

## 2018-10-30 ENCOUNTER — Encounter (INDEPENDENT_AMBULATORY_CARE_PROVIDER_SITE_OTHER): Payer: Self-pay | Admitting: Family Medicine

## 2018-10-31 ENCOUNTER — Ambulatory Visit: Payer: Self-pay | Admitting: Family Medicine

## 2018-10-31 NOTE — Telephone Encounter (Signed)
Routing to Nurse Triage to advise on patient symptoms.

## 2018-10-31 NOTE — Telephone Encounter (Signed)
TE created to address this issue  --  Hi Dr. Hei,  The lymph node gland on the left side under chin has been swollen for about two weeks and has recently become achy.  Last night dizzy during the night and going into today although it is better now.   Phlegmy throat for about a week.  No fever, no cough or what I understand as COVID19 symptoms.  Please advise.  Thank you,  Cassie Contreras

## 2018-10-31 NOTE — Telephone Encounter (Signed)
TE created to address this issue  --  Hi Dr. Tarry Kos,  The lymph node gland on the left side under chin has been swollen for about two weeks and has recently become achy.  Last night dizzy during the night and going into today although it is better now.   Phlegmy throat for about a week.  No fever, no cough or what I understand as COVID19 symptoms.  Please advise.  Thank you,  Cassie Contreras

## 2018-11-01 NOTE — Telephone Encounter (Signed)
Patient reports  Sinus congestion/pressure last couple of days.  Left side of neck lymph node, slightly swollen, size a nickel  Using neti pot with good relief.  Had episode of dizziness Wednesday night when lying down, turning head. Has since resolved.  Declined clinic appointment at this time. States she will follow home care advice and schedule on Monday with ongoing symptoms.    Reason for Disposition  . [1] MILD dizziness (e.g., vertigo; walking normally) AND [2] has NOT been evaluated by physician for this    Additional Information  . Negative: [1] Weakness (i.e., paralysis, loss of muscle strength) of the face, arm or leg on one side of the body AND [2] sudden onset AND [3] present now  . Negative: [1] Numbness (i.e., loss of sensation) of the face, arm or leg on one side of the body AND [2] sudden onset AND [3] present now  . Negative: [1] Loss of speech or garbled speech AND [2] sudden onset AND [3] present now  . Negative: Difficult to awaken or acting confused (e.g., disoriented, slurred speech)  . Negative: Sounds like a life-threatening emergency to the triager  . Negative: Followed a head injury  . Negative: Followed an ear injury  . Negative: Localized weakness or numbness is main symptom  . Negative: Dizziness relates to riding in a car, going to an amusement park, etc.  . Negative: [1] Dizziness is main symptom AND [2] NO spinning sensation (i.e., vertigo)  . Negative: SEVERE dizziness (vertigo) (e.g., unable to walk without assistance)  . Negative: [1] Dizziness (vertigo) present now AND [2] one or more stroke risk factors (i.e., hypertension, diabetes, prior stroke/TIA, heart attack)  (Exception: prior physician evaluation for this AND no different/worse than usual)  . Negative: [1] Dizziness (vertigo) present now AND [2] age > 82  (Exception: prior physician evaluation for this AND no different/worse than usual)  . Negative: Severe headache (e.g., excruciating)  (Exception: similar to previous  migraines)  . Negative: Patient sounds very sick or weak to the triager  . Negative: Taking a medicine that could cause dizziness (e.g., phenytoin [Dilantin], carbamazepine [Tegretol], primidone [Mysoline])  . Negative: [1] MODERATE dizziness (e.g., vertigo; feels very unsteady, interferes with normal activities) AND [2] has NOT been evaluated by physician for this  . Negative: Earache  . Negative: Vomiting occurs with dizziness  . Negative: [1] MODERATE dizziness (e.g., vertigo; feels very unsteady, interferes with normal activities) AND [2] has been evaluated by physician for this    Protocols used: DIZZINESS - VERTIGO-ADULT - AH

## 2018-11-08 ENCOUNTER — Encounter (HOSPITAL_BASED_OUTPATIENT_CLINIC_OR_DEPARTMENT_OTHER): Payer: Self-pay | Admitting: Family Medicine

## 2018-11-08 DIAGNOSIS — Z1231 Encounter for screening mammogram for malignant neoplasm of breast: Secondary | ICD-10-CM

## 2018-11-10 ENCOUNTER — Other Ambulatory Visit: Payer: Self-pay

## 2018-11-11 ENCOUNTER — Other Ambulatory Visit (INDEPENDENT_AMBULATORY_CARE_PROVIDER_SITE_OTHER): Payer: Self-pay | Admitting: Family Medicine

## 2018-11-11 DIAGNOSIS — N761 Subacute and chronic vaginitis: Secondary | ICD-10-CM

## 2018-11-13 MED ORDER — VAGIFEM 10 MCG VA TABS
ORAL_TABLET | VAGINAL | 0 refills | Status: DC
Start: 2018-11-13 — End: 2019-01-29

## 2018-11-14 ENCOUNTER — Telehealth (INDEPENDENT_AMBULATORY_CARE_PROVIDER_SITE_OTHER): Payer: Self-pay | Admitting: Family Medicine

## 2018-11-14 NOTE — Telephone Encounter (Signed)
Sent Ecare message to Pt to schedule appointment for medicare wellness.

## 2018-11-16 NOTE — Telephone Encounter (Signed)
Left voice message for patient to call back to schedule a Medicare wellness visit. CCR/PSR: when patient calls back, please schedule.

## 2018-12-02 NOTE — Telephone Encounter (Signed)
Final Attempt: Sending AWV letter.    Closing TE.

## 2018-12-05 ENCOUNTER — Ambulatory Visit: Payer: PPO | Attending: Family Medicine

## 2018-12-05 DIAGNOSIS — Z1231 Encounter for screening mammogram for malignant neoplasm of breast: Secondary | ICD-10-CM | POA: Insufficient documentation

## 2018-12-26 ENCOUNTER — Encounter (INDEPENDENT_AMBULATORY_CARE_PROVIDER_SITE_OTHER): Payer: Self-pay | Admitting: Family Medicine

## 2018-12-31 ENCOUNTER — Telehealth (INDEPENDENT_AMBULATORY_CARE_PROVIDER_SITE_OTHER): Payer: Self-pay | Admitting: Family Medicine

## 2018-12-31 NOTE — Telephone Encounter (Signed)
I left a voice message to patient to make a medicare awv.

## 2019-01-01 NOTE — Telephone Encounter (Signed)
error 

## 2019-01-08 NOTE — Telephone Encounter (Signed)
Note: Patient does not want an AWV.    Closing TE.

## 2019-01-27 ENCOUNTER — Other Ambulatory Visit (INDEPENDENT_AMBULATORY_CARE_PROVIDER_SITE_OTHER): Payer: Self-pay | Admitting: Family Medicine

## 2019-01-27 DIAGNOSIS — N761 Subacute and chronic vaginitis: Secondary | ICD-10-CM

## 2019-01-29 MED ORDER — VAGIFEM 10 MCG VA TABS
ORAL_TABLET | VAGINAL | 0 refills | Status: DC
Start: 2019-01-29 — End: 2019-04-29

## 2019-01-29 NOTE — Telephone Encounter (Signed)
1st attempt-ecare sent

## 2019-01-29 NOTE — Telephone Encounter (Signed)
Patient is due for yearly exam.  One refill authorized.  Please schedule follow up visit.2nd request

## 2019-01-31 NOTE — Telephone Encounter (Signed)
Patient read eCare.

## 2019-02-15 ENCOUNTER — Ambulatory Visit: Payer: PPO | Attending: Infectious Disease

## 2019-02-15 DIAGNOSIS — Z23 Encounter for immunization: Secondary | ICD-10-CM | POA: Insufficient documentation

## 2019-02-19 ENCOUNTER — Ambulatory Visit (INDEPENDENT_AMBULATORY_CARE_PROVIDER_SITE_OTHER): Payer: PPO | Admitting: Family Medicine

## 2019-02-19 ENCOUNTER — Other Ambulatory Visit: Payer: Self-pay

## 2019-02-19 ENCOUNTER — Encounter (INDEPENDENT_AMBULATORY_CARE_PROVIDER_SITE_OTHER): Payer: Self-pay | Admitting: Family Medicine

## 2019-02-19 VITALS — BP 136/80 | HR 69 | Temp 97.3°F | Resp 16 | Ht 68.11 in | Wt 153.0 lb

## 2019-02-19 DIAGNOSIS — E041 Nontoxic single thyroid nodule: Secondary | ICD-10-CM

## 2019-02-19 DIAGNOSIS — Z0000 Encounter for general adult medical examination without abnormal findings: Secondary | ICD-10-CM

## 2019-02-19 DIAGNOSIS — I10 Essential (primary) hypertension: Secondary | ICD-10-CM

## 2019-02-19 DIAGNOSIS — F419 Anxiety disorder, unspecified: Secondary | ICD-10-CM

## 2019-02-19 DIAGNOSIS — N6311 Unspecified lump in the right breast, upper outer quadrant: Secondary | ICD-10-CM

## 2019-02-19 DIAGNOSIS — L853 Xerosis cutis: Secondary | ICD-10-CM

## 2019-02-19 LAB — COMPREHENSIVE METABOLIC PANEL
ALT (GPT): 20 U/L (ref 7–33)
AST (GOT): 23 U/L (ref 9–38)
Albumin: 4.2 g/dL (ref 3.5–5.2)
Alkaline Phosphatase (Total): 84 U/L (ref 38–172)
Anion Gap: 11 (ref 4–12)
Bilirubin (Total): 0.5 mg/dL (ref 0.2–1.3)
Calcium: 9.2 mg/dL (ref 8.9–10.2)
Carbon Dioxide, Total: 25 meq/L (ref 22–32)
Chloride: 98 meq/L (ref 98–108)
Creatinine: 0.7 mg/dL (ref 0.38–1.02)
Glucose: 90 mg/dL (ref 62–125)
Potassium: 4.4 meq/L (ref 3.6–5.2)
Protein (Total): 6.8 g/dL (ref 6.0–8.2)
Sodium: 134 meq/L — ABNORMAL LOW (ref 135–145)
Urea Nitrogen: 11 mg/dL (ref 8–21)
eGFR by CKD-EPI: >60 mL/min/{1.73_m2} (ref >59)

## 2019-02-19 LAB — LIPID PANEL
Cholesterol (LDL): 79 mg/dL (ref <130)
Cholesterol/HDL Ratio: 2.3
HDL Cholesterol: 77 mg/dL (ref >39)
Non-HDL Cholesterol: 97 mg/dL (ref 0–159)
Total Cholesterol: 174 mg/dL (ref <200)
Triglyceride: 88 mg/dL (ref <150)

## 2019-02-19 LAB — TSH WITH REFLEXIVE FREE T4: TSH with Reflexive Free T4: 1.162 u[IU]/mL (ref 0.400–5.000)

## 2019-02-19 MED ORDER — ATENOLOL 25 MG OR TABS
25.0000 mg | ORAL_TABLET | Freq: Every day | ORAL | 0 refills | Status: DC
Start: 2019-02-19 — End: 2019-03-19

## 2019-02-19 MED ORDER — CLONAZEPAM 0.5 MG OR TABS
0.5000 mg | ORAL_TABLET | Freq: Every day | ORAL | 0 refills | Status: DC | PRN
Start: 2019-02-19 — End: 2020-04-07

## 2019-02-19 MED ORDER — BIMATOPROST 0.03 % EX SOLN
1.0000 | Freq: Every evening | CUTANEOUS | 0 refills | Status: DC
Start: 2019-02-19 — End: 2020-04-07

## 2019-02-19 NOTE — Patient Instructions (Signed)
Cetaphil cream for dry hands

## 2019-02-20 DIAGNOSIS — I10 Essential (primary) hypertension: Secondary | ICD-10-CM | POA: Insufficient documentation

## 2019-02-20 DIAGNOSIS — N6311 Unspecified lump in the right breast, upper outer quadrant: Secondary | ICD-10-CM | POA: Insufficient documentation

## 2019-02-20 DIAGNOSIS — F419 Anxiety disorder, unspecified: Secondary | ICD-10-CM | POA: Insufficient documentation

## 2019-02-20 NOTE — Progress Notes (Signed)
ANNUAL WELLNESS VISIT    Cassie Contreras is a 69 year old female who presents for an Annual Wellness Visit.   []  Initial   [x]  Subsequent     INFORMATION GATHERING:  The following areas were confirmed with patient/caregiver and/or updated in Epic at this visit (required):    [x]  Past Medical History   []  Past Surgical History   []  Family History   [x]  Social History    [x]  Current medications and supplements (including vitamins and calcium)   [x]  Allergies    The information below is up to date at the end of this  visit (required):  Review of functional ability, hearing, fall risk and safety, diet, physical activity, and health habits  (via HRA or direct review with patient or caregiver)    The Health Risk Assessment (HRA) for today's visit (required) was completed (check one):   []   as Patient-Entered Questionnaire via eCare (visible in eCare Questionnaire                     Encounter and in Synopsis)   [x]  as paper HRA form, completed by or with the patient or caregiver (reminder:                    paper form, if completed, must be scanned to Media)   []  in HRA template documentation, completed by staff or provider with patient or  caregiver at visit (for use when HRA was not completed prior to rooming)      List of current providers and suppliers (check all that apply):   [x]   See Care Team Section   []   See EHR Encounters for [facility] Providers involved in care   []   Other providers and suppliers outside [facility]     Depression screening:  PHQ-2: 0  PHQ-9 (if done):      EXAM:  BP 136/80   Pulse 69   Temp 97.3 F (36.3 C) (Temporal)   Resp 16   Ht 5' 8.11" (1.73 m)   Wt 153 lb (69.4 kg)   SpO2 99%   BMI 23.19 kg/m   GAIT:    [x]  Normal, stable, independent    []  Abnormal (describe):    As assessed by:     [x]  Direct Observation     []  Timed Up and Go     []  Other:  COGNITION:    [x]  Intact    []  Abnormal (describe):     As assessed by:      []  Direct Observation     [x]  Brief Cognitive  Screen  BREAST: soft nontender mobile mass in 11 -o'clock position of RIGHT breast without overlying erythema, edema or echymosis; similar although softer smaller on contralateral side.  LUNGS: The lungs are clear to auscultation.  CV: Regular rate and rhythm. S1 and S2 normal, no murmurs, clicks, gallops or rubs.     ADVANCE CARE PLANNING (ACP) (optional)  Advance care planning:   []  Patient Accepted   [x]  Patient Declined     Check all that apply:   []  Advance directives are on file in her chart   []  Explained & discussed advance directives at this visit   []  Completed advance care planning form(s) at this visit   []  Other Advance Care Planning discussion at this visit (describe):       Time Spent on ACP: [x]  None       []  1-15 minutes      []   16-30 minutes      []  >30 minutes      ASSESSMENT:  Cassie Contreras was seen for her Annual Wellness Visit, including identification of risk factors & conditions that may affect her health and function in the future.   (Z00.00) Medicare annual wellness visit, subsequent  (primary encounter diagnosis)  Plan: Lipid Panel, Comprehensive Metabolic Panel    (F41.9) Anxiety  Plan: clonazePAM 0.5 MG tablet    (E04.1) Thyroid nodule  Plan: TSH with Reflexive Free T4    (I10) Benign hypertension  Plan: atenolol 25 MG tablet    (L85.3) Dry skin  Plan: bimatoprost (Latisse) 0.03 % topical solution    (N63.11) Breast lump on right side at 11 o'clock position  Plan: Mammography Diagnostic with Tomo Bilateral, [name]         Breast Limited Right    (Z00.00) Encounter for Medicare annual wellness exam    Anxiety: clonazepam refill but also started bblcoker which advised should help in this regard as well.    HTN: increasing BP's over time.  Will trial bblcoker. Side effects reviewed.     Breast lump: likely benign given contralateral similar area, but will proceed w dx mammo and u/s w bx if indicated at [facility].       Actions at this visit (all 3 required):   [x]   Establishing or updating a  written schedule of screening and prevention  measures recommended and appropriate for [facility] for the next 5-10 years   [x]   Establishing or updating a list of her risk factors and conditions for which  lifestyle or medical interventions are recommended or underway, including  mental health risks and conditions, and including risks/benefits of treatment   [x]   Furnishing personalized health advice and, as appropriate, referrals to health  education or preventive counseling services or programs (such as fall  prevention, tobacco cessation, physical activity, nutrition, weight loss)      COUNSELING:      Here are screening & prevention measures recommended for you:  Health Maintenance   Topic Date Due   . Zoster Vaccine (2 of 3) 12/28/2010   . Influenza Vaccine (1) 10/24/2018   . Medicare Annual Wellness Visit  11/09/2018   . COVID-19 Vaccine (2 of 2 - Pfizer series) 03/08/2019   . Depression Screening (PHQ-2)  02/19/2020   . Breast Cancer Screening  12/04/2020   . DTaP, Tdap, and Td Vaccines (2 - Td) 03/20/2022   . Lipid Disorders Screening  02/19/2024   . Colorectal Cancer Screening  02/23/2025   . Osteoporosis Screening  Completed   . Pneumococcal Vaccine: 65+ years  Completed   . Hepatitis C Screening  Completed   . Hepatitis A Vaccine  Aged Out   . Hepatitis B Vaccine  Aged Out   . Pneumococcal Vaccine: Pediatrics (0-5 years) and At-Risk Patients (6-64 years)  Aged Out       Based on today's evaluation, I recommend the following ways to improve your health or functioning:      You have the following risk factors and/or medical conditions for which there are recommended ways (included in the list) to help you stay as healthy as possible:  1.    Call [facility] for breast lump workup  2.  Continue try to reduce salt and increase activities as tolerated  3.  Start atenolol 25mg  for HTN       Please plan to have a Subsequent Annual Wellness Visit in 1 year.

## 2019-02-25 ENCOUNTER — Ambulatory Visit: Payer: PPO | Attending: Family Medicine

## 2019-02-25 ENCOUNTER — Other Ambulatory Visit: Payer: Self-pay | Admitting: Family Medicine

## 2019-02-25 ENCOUNTER — Other Ambulatory Visit (HOSPITAL_BASED_OUTPATIENT_CLINIC_OR_DEPARTMENT_OTHER): Payer: PPO

## 2019-02-25 ENCOUNTER — Ambulatory Visit (HOSPITAL_BASED_OUTPATIENT_CLINIC_OR_DEPARTMENT_OTHER): Payer: PPO

## 2019-02-25 DIAGNOSIS — N6331 Unspecified lump in axillary tail of the right breast: Secondary | ICD-10-CM | POA: Insufficient documentation

## 2019-03-14 ENCOUNTER — Ambulatory Visit (HOSPITAL_BASED_OUTPATIENT_CLINIC_OR_DEPARTMENT_OTHER): Payer: PPO

## 2019-03-14 DIAGNOSIS — Z23 Encounter for immunization: Secondary | ICD-10-CM

## 2019-03-17 ENCOUNTER — Other Ambulatory Visit: Payer: Self-pay

## 2019-03-19 ENCOUNTER — Telehealth (INDEPENDENT_AMBULATORY_CARE_PROVIDER_SITE_OTHER): Payer: PPO | Admitting: Family Medicine

## 2019-03-19 DIAGNOSIS — I10 Essential (primary) hypertension: Secondary | ICD-10-CM

## 2019-03-19 MED ORDER — ATENOLOL 25 MG OR TABS
25.0000 mg | ORAL_TABLET | Freq: Every day | ORAL | 1 refills | Status: DC
Start: 2019-03-19 — End: 2019-05-10

## 2019-03-19 NOTE — Progress Notes (Signed)
ASSESSMENT/PLAN:  (I10) Benign hypertension  (primary encounter diagnosis)  Plan: therapeutic The current medical regimen is effective;  continue present plan and medications. follow up in 6 months       Distant Site Telemedicine Encounter    I conducted this encounter from [facility] via secure, live, face-to-face video conference with the patient. [name] was located at home.  Prior to the interview, the risks and benefits of telemedicine were discussed with the patient and verbal consent was obtained.      END OF VISIT SUMMARY  PLEASE SEE BELOW FOR SUPPORTING DOCUMENTATION AND DETAILS.   -----------------------------------------------------------------------------------------------------------------    SUBJECTIVE:  Cassie Contreras is a 69 year old female presenting today for the following issue(s):    F/u of HTN since re-started atenolol 36m.  Overall tolerating well, slight "laid back" initially.  Anxiety still present but perhaps less.    ROS: no fever    Patient Active Problem List    Diagnosis Date Noted   . Benign hypertension [I10] 02/20/2019   . Anxiety [F41.9] 02/20/2019   . Breast lump on right side at 11 o'clock position [N63.11] 02/20/2019   . Obstructive sleep apnea [G47.33] 01/11/2018     PSG 01/03/18  Positional, severe OSA (AHI 52 supine, 2.8 lateral); overall mild/mod with AHI 1A 20.6, 1B 9.8, and ODI 12.4     . Mixed incontinence [N39.46] 04/20/2017   . Pelvic floor weakness [N81.89] 04/20/2017   . Dysphonia [R49.0] 04/05/2017   . Thyroid nodule [E04.1] 03/22/2017   . Atrophic vaginitis [N95.2] 11/15/2015   . Hormone replacement therapy [Z79.890] 11/15/2015   . 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 Osteopenia of spine [M85.88] 11/10/2015   . Palpitations [R00.2] 11/10/2015   . 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     . S/P foot & ankle surgery, left [Z98.890] 08/01/2012   . 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  No acute distress.  Speaking in full sentences.     Results for orders placed or performed in visit on 02/19/19   Lipid Panel   Result Value Ref Range    Cholesterol (Total) 174 <200 mg/dL    Triglyceride 88 <150 mg/dL    Cholesterol (HDL) 77 >39 mg/dL    Cholesterol (LDL) 79 <130 mg/dL    Non-HDL Cholesterol 97 0 - 159 mg/dL    Cholesterol/HDL Ratio 2.3     Lipid Panel, Additional Info. (NOTE)    Comprehensive Metabolic Panel   Result Value Ref Range    Sodium 134 (L) 135 - 145 meq/L    Potassium 4.4 3.6 - 5.2 meq/L    Chloride 98 98 - 108 meq/L    Carbon Dioxide, Total 25 22 - 32 meq/L    Anion Gap 11 4 - 12    Glucose 90 62 - 125 mg/dL    Urea Nitrogen 11 8 - 21 mg/dL    Creatinine 0.70 0.38 - 1.02 mg/dL    Protein (Total) 6.8 6.0 - 8.2 g/dL    Albumin 4.2 3.5 - 5.2 g/dL    Bilirubin (Total) 0.5 0.2 - 1.3 mg/dL    Calcium 9.2 8.9 - 10.2 mg/dL  AST (GOT) 23 9 - 38 U/L    Alkaline Phosphatase (Total) 84 38 - 172 U/L    ALT (GPT) 20 7 - 33 U/L    eGFR, Calculated >60 >59 mL/min/[1.73_m2]    GFR, Information       Calculated GFR by CKD-EPI equation. Inaccurate with changing renal function. See http://depts.YourCloudFront.fr.html.   TSH with Reflexive Free T4   Result Value Ref Range    TSH with Reflexive Free T4 1.162 0.400 - 5.000 u[IU]/mL        -----------------------------------------------------------------------------------------------------------------  ALSO SEE THE BEGINNING OF THIS NOTE FOR ASSESSMENT AND PLAN    -----------------------------------------------------------------------------------------------------------------

## 2019-03-29 ENCOUNTER — Encounter (INDEPENDENT_AMBULATORY_CARE_PROVIDER_SITE_OTHER): Payer: Self-pay | Admitting: Family Medicine

## 2019-03-29 DIAGNOSIS — K219 Gastro-esophageal reflux disease without esophagitis: Secondary | ICD-10-CM

## 2019-03-31 NOTE — Telephone Encounter (Signed)
Routing to Dr. Hei

## 2019-04-02 MED ORDER — FAMOTIDINE 40 MG OR TABS
40.0000 mg | ORAL_TABLET | Freq: Every evening | ORAL | 1 refills | Status: DC
Start: 2019-04-02 — End: 2019-07-09

## 2019-04-16 ENCOUNTER — Ambulatory Visit (INDEPENDENT_AMBULATORY_CARE_PROVIDER_SITE_OTHER): Payer: PPO

## 2019-04-16 ENCOUNTER — Encounter (INDEPENDENT_AMBULATORY_CARE_PROVIDER_SITE_OTHER): Payer: Self-pay | Admitting: Family Medicine

## 2019-04-16 ENCOUNTER — Telehealth (INDEPENDENT_AMBULATORY_CARE_PROVIDER_SITE_OTHER): Payer: PPO | Admitting: Family Medicine

## 2019-04-16 ENCOUNTER — Other Ambulatory Visit: Payer: Self-pay

## 2019-04-16 ENCOUNTER — Ambulatory Visit: Payer: PPO | Attending: Family Medicine

## 2019-04-16 DIAGNOSIS — M25552 Pain in left hip: Secondary | ICD-10-CM

## 2019-04-16 NOTE — Telephone Encounter (Signed)
Patient was called back with x-ray results, further mgmt, ed precautions and return to clinic precautions

## 2019-04-16 NOTE — Telephone Encounter (Signed)
Routing to provider for review and advise

## 2019-04-16 NOTE — Progress Notes (Signed)
Chief Complaint   Patient presents with   . Musculoskeletal Problem     possible strained left hip     Distant Site Telemedicine Encounter    I conducted this encounter from [facility] via secure, live, face-to-face video conference with the patient. Silvie was located in [place].  Prior to the interview, the risks and benefits of telemedicine were discussed with the patient and verbal consent was obtained.      Subjective:   Cassie Contreras is a 69 year old female who presents on 04/16/2019 for the following concerns:    She has had back problems in the past - has tried flexeril before and tramadol for flare ups and took some for hip strain. Last week was doing elliptical and stationary was alternating days. 3/20 last day she was on elllipitical. Last day of cycling was 3/23. Wasn't trying to increase mileage but has tried to pick up pace more. She also has been taking walks. Tries to stay active. Two night ago was sitting in bed, noticed pain in the bottom of pelvis on left side, went to sleep and woke up in middle of night with pain that kept her awake. Pain is on the front of left hip and to the back of hip.  Next day was sore. Last night felt sharp pain again.  Has tried advil. Has tried  tramadol, and flexeril for more severe pain. Symptoms okay through day but in evening was sharp pain/throbbing. Worsened with prolonged sitting. Alleviated with breaks from sitting, laying down. No numbness/tingling. No incontinence. No injuries or prior fractures. No fevers/swelling.     I reviewed the patient's recorded medical history, medications, problem list, allergies and past medical history.    ROS  Hip pain, left    Objective:   Vitals: There were no vitals taken for this visit.  Physical Exam  No Vitals measured (telemedicine)  Physical Exam Findings via Zoom.   General: In no acute distress  Normal Respiratory Effort. No Cough heard.  Psych: Voice clear and coherent. Content discussed  relevant.    Assessment and Plan:   Left hip pain  - XR Pelvis 1 Or 2 W 1 View Hip Left; Future  Recommend trial of tylenol or naproxen. Can continue flexeril or tramadol if helping.  Modify activity at this time and increase as tolerated. Discussed to come into clinic for exam if not improving. Reviewed ED precautions in detail.   Patient was able to get same day x-ray - she went to [facility]. I called patient and reviewed x-ray findings with patient over the phone today.   XR PELVIS 2 W 1 VIEW HIP LEFT   FINDINGS AND IMPRESSION:  No fracture or dislocation. Joint spaces are maintained. Soft tissues are   Unremarkable.    I spent a total of 30 minutes for the patient's care on the date of the service.    Georjean Mode, DO  [facility]  [address], [address]  [place] [address]  [phone]

## 2019-04-18 ENCOUNTER — Encounter (INDEPENDENT_AMBULATORY_CARE_PROVIDER_SITE_OTHER): Payer: Self-pay | Admitting: Family Medicine

## 2019-04-18 ENCOUNTER — Encounter (HOSPITAL_COMMUNITY): Payer: Self-pay

## 2019-04-18 DIAGNOSIS — M25552 Pain in left hip: Secondary | ICD-10-CM

## 2019-04-19 NOTE — Historical Transplant Communications (Signed)
Merit, [place] [phone])  Transplant Communications  ____________________________________________________________________________________    Date: 05/04/2011  Type: Action  Entry By: Marcell Barlow  Notified pt, per Dr. Birdie Riddle, that she is not eligible to donate.   ____________________________________________________________________________________    Date: 04/28/2011  Type: Medical info  Entry By: Toniann Fail  Reviewed donor intake--I don't think she is a good candidate because of hx recurrent bladder infections.  ____________________________________________________________________________________    Date: 04/28/2011  Type: Chart in review  Entry By: kamis  Recipient is cleared for donor work up. To see if a canidate. bladder issues?  ____________________________________________________________________________________    Date: 01/03/2011  Type: Donor Screening  Entry By: cherrs  Initial screening completed. SW informed pt about donor exchange and risk for donation.

## 2019-04-19 NOTE — Historical Transplant Studies (Signed)
Cassie Contreras, Cassie Contreras [phone])  Kidney Donation Studies  Referral Date: 01/03/2011  Status: Inactive  ____________________________________________________________________________________    Potential Recipients:  [place] Dec [phone])  Died on Waitlist  Unrelated (friend/colleague)    Functional Status:     Physical Capacity:     Diabetes: No    Kidney Biopsy:   Glomerulosclerosis:     Malignancies: No     Alcohol Consumption:     Tobacco History:    Packs per year:   Abstinence:   Other Tobacco Used:     Surgical History: tonsilectomy when she was a child    Psychosocial History:     History of Cancer:    Cancer Free Interval:     History of Hypertension: No    Medications: Atenolol for rapid heart beat, Prempro for estrogen, Flomax and Nitrofurantion 15mg  for chronic bladder infections.

## 2019-04-20 ENCOUNTER — Encounter (INDEPENDENT_AMBULATORY_CARE_PROVIDER_SITE_OTHER): Payer: Self-pay | Admitting: Family Medicine

## 2019-04-21 ENCOUNTER — Encounter (INDEPENDENT_AMBULATORY_CARE_PROVIDER_SITE_OTHER): Payer: Self-pay | Admitting: Family Medicine

## 2019-04-21 NOTE — Telephone Encounter (Signed)
Routing to Dr. Hei for review and advisement.

## 2019-04-22 NOTE — Telephone Encounter (Signed)
Cyclobenzaprine HCL 10 MG tabs and Tramadol HCL 50 MG has been discontinued. Routing to provider for review and advise

## 2019-04-23 MED ORDER — TRAMADOL HCL 50 MG OR TABS
50.0000 mg | ORAL_TABLET | ORAL | 0 refills | Status: DC | PRN
Start: 2019-04-23 — End: 2021-11-10

## 2019-04-23 MED ORDER — CYCLOBENZAPRINE HCL 10 MG OR TABS
10.0000 mg | ORAL_TABLET | Freq: Three times a day (TID) | ORAL | 0 refills | Status: DC | PRN
Start: 2019-04-23 — End: 2020-04-07

## 2019-04-23 NOTE — Telephone Encounter (Signed)
In-person preferred, please change

## 2019-04-23 NOTE — Telephone Encounter (Signed)
LVM for pt informing of change.

## 2019-04-23 NOTE — Telephone Encounter (Signed)
Patient has a telemedicine apt schedule and would like to know if he can do an in person apt.Please advise.     Routing to Adams, Darlyn Chamber, MD for review and advisement.

## 2019-04-27 ENCOUNTER — Other Ambulatory Visit (INDEPENDENT_AMBULATORY_CARE_PROVIDER_SITE_OTHER): Payer: Self-pay | Admitting: Family Medicine

## 2019-04-27 DIAGNOSIS — N761 Subacute and chronic vaginitis: Secondary | ICD-10-CM

## 2019-04-29 NOTE — Telephone Encounter (Signed)
This request falls outside refill center's protocols:     Vagifem-last mammogram on 02/25/19, continue the same? Defer to provider.    If this medication is denied please have your staff inform the patient and schedule an appointment if necessary

## 2019-04-30 ENCOUNTER — Other Ambulatory Visit (INDEPENDENT_AMBULATORY_CARE_PROVIDER_SITE_OTHER): Payer: Self-pay | Admitting: Family Medicine

## 2019-04-30 ENCOUNTER — Encounter (INDEPENDENT_AMBULATORY_CARE_PROVIDER_SITE_OTHER): Payer: PPO | Admitting: Family Medicine

## 2019-04-30 DIAGNOSIS — L709 Acne, unspecified: Secondary | ICD-10-CM

## 2019-04-30 MED ORDER — VAGIFEM 10 MCG VA TABS
ORAL_TABLET | VAGINAL | 3 refills | Status: DC
Start: 2019-04-30 — End: 2020-03-02

## 2019-05-01 MED ORDER — TRETINOIN 0.05 % EX CREA
TOPICAL_CREAM | CUTANEOUS | 0 refills | Status: DC
Start: 2019-05-01 — End: 2019-10-06

## 2019-05-08 ENCOUNTER — Other Ambulatory Visit (INDEPENDENT_AMBULATORY_CARE_PROVIDER_SITE_OTHER): Payer: Self-pay | Admitting: Family Medicine

## 2019-05-08 DIAGNOSIS — I10 Essential (primary) hypertension: Secondary | ICD-10-CM

## 2019-05-10 MED ORDER — ATENOLOL 25 MG OR TABS
ORAL_TABLET | ORAL | 0 refills | Status: DC
Start: 2019-05-10 — End: 2019-05-13

## 2019-05-11 ENCOUNTER — Other Ambulatory Visit (INDEPENDENT_AMBULATORY_CARE_PROVIDER_SITE_OTHER): Payer: Self-pay | Admitting: Family Medicine

## 2019-05-11 DIAGNOSIS — I10 Essential (primary) hypertension: Secondary | ICD-10-CM

## 2019-05-13 MED ORDER — ATENOLOL 25 MG OR TABS
ORAL_TABLET | ORAL | 0 refills | Status: DC
Start: 2019-05-13 — End: 2020-01-12

## 2019-06-16 ENCOUNTER — Encounter (INDEPENDENT_AMBULATORY_CARE_PROVIDER_SITE_OTHER): Payer: Self-pay | Admitting: Family Medicine

## 2019-06-17 NOTE — Telephone Encounter (Signed)
Please see patient's eCare message and advise if patient should continue to wear a mask at the gym

## 2019-06-23 ENCOUNTER — Encounter (INDEPENDENT_AMBULATORY_CARE_PROVIDER_SITE_OTHER): Payer: Self-pay | Admitting: Family Medicine

## 2019-06-24 ENCOUNTER — Ambulatory Visit: Payer: Self-pay | Admitting: Family Medicine

## 2019-06-24 NOTE — Telephone Encounter (Signed)
TE created to address this issue  ---------  [name] stabbing pain just behind my left ankle began Sunday.  Flexing foot on stairs and when walking aggravates the problem.  This is the same ankle I had surgery on in 2014 (Dr. Bertell Maria) and the pain feels similar to what I experienced before the surgery.  (I had cysts in my ankle joint that he filled with bone from my heal mixed with fake bone.). I have been limiting my walking, taking ibuprofen and using some Voltaren Gel but these things don't seem to have any affect on it at all.  Please advise and thank you,  Cassie Contreras

## 2019-06-24 NOTE — Telephone Encounter (Signed)
Routing to nurse triage, please assess

## 2019-06-24 NOTE — Telephone Encounter (Signed)
TE created to address this issue  ---------  Sharp stabbing pain just behind my left ankle began Sunday.  Flexing foot on stairs and when walking aggravates the problem.  This is the same ankle I had surgery on in 2014 (Dr. Bouche) and the pain feels similar to what I experienced before the surgery.  (I had cysts in my ankle joint that he filled with bone from my heal mixed with fake bone.). I have been limiting my walking, taking ibuprofen and using some Voltaren Gel but these things don't seem to have any affect on it at all.  Please advise and thank you,  Cassie Contreras

## 2019-06-26 NOTE — Telephone Encounter (Signed)
Triage has not been able to reach Mosinee on the phone.  We sent email and left VM requesting that she call back to nurse line to discuss symptoms and to schedule appt.  UC/ED advised for worsening symptoms.  Closing and routing to clinic for follow-up.      Reason for Disposition   Message left on identified voice mail    Protocols used: NO CONTACT OR DUPLICATE CONTACT CALL-ADULT - AH

## 2019-06-27 ENCOUNTER — Telehealth (INDEPENDENT_AMBULATORY_CARE_PROVIDER_SITE_OTHER): Payer: Self-pay | Admitting: Family Medicine

## 2019-06-27 NOTE — Telephone Encounter (Signed)
Attempted to reach patient follow up/nurse triage call left vm.

## 2019-07-05 ENCOUNTER — Encounter (INDEPENDENT_AMBULATORY_CARE_PROVIDER_SITE_OTHER): Payer: Self-pay | Admitting: Family Medicine

## 2019-07-05 DIAGNOSIS — K219 Gastro-esophageal reflux disease without esophagitis: Secondary | ICD-10-CM

## 2019-07-05 NOTE — Telephone Encounter (Signed)
Pt requesting new rx for heartburn as she is having headaches with Famotidine. Pharmacy pended.Routing to provider

## 2019-07-09 MED ORDER — OMEPRAZOLE 20 MG OR CPDR
20.0000 mg | DELAYED_RELEASE_CAPSULE | Freq: Every day | ORAL | 3 refills | Status: DC
Start: 2019-07-09 — End: 2020-07-08

## 2019-07-14 ENCOUNTER — Encounter (INDEPENDENT_AMBULATORY_CARE_PROVIDER_SITE_OTHER): Payer: Self-pay | Admitting: Family Medicine

## 2019-07-14 DIAGNOSIS — M79672 Pain in left foot: Secondary | ICD-10-CM

## 2019-07-14 DIAGNOSIS — G8929 Other chronic pain: Secondary | ICD-10-CM

## 2019-07-14 NOTE — Telephone Encounter (Signed)
Pending referral

## 2019-07-18 ENCOUNTER — Other Ambulatory Visit (INDEPENDENT_AMBULATORY_CARE_PROVIDER_SITE_OTHER): Payer: Self-pay | Admitting: Family Medicine

## 2019-07-18 DIAGNOSIS — B349 Viral infection, unspecified: Secondary | ICD-10-CM

## 2019-07-18 NOTE — Telephone Encounter (Signed)
This medication is outside of the Refill Center's protocols.   Valacyclovir - discontinued from med list    If this medication is denied please have your staff inform the patient and schedule an appointment if necessary.

## 2019-07-21 ENCOUNTER — Encounter (INDEPENDENT_AMBULATORY_CARE_PROVIDER_SITE_OTHER): Payer: Self-pay

## 2019-07-21 ENCOUNTER — Encounter (INDEPENDENT_AMBULATORY_CARE_PROVIDER_SITE_OTHER): Payer: Self-pay | Admitting: Family Medicine

## 2019-07-21 MED ORDER — VALACYCLOVIR HCL 500 MG OR TABS
500.0000 mg | ORAL_TABLET | Freq: Every day | ORAL | 2 refills | Status: DC
Start: 2019-07-21 — End: 2019-07-25

## 2019-07-21 NOTE — Telephone Encounter (Signed)
FYI.     Routing to Hei, Thomas Kou, MD for review and advisement.

## 2019-07-25 ENCOUNTER — Telehealth (INDEPENDENT_AMBULATORY_CARE_PROVIDER_SITE_OTHER): Payer: Self-pay | Admitting: Family Medicine

## 2019-07-25 DIAGNOSIS — B349 Viral infection, unspecified: Secondary | ICD-10-CM

## 2019-07-25 MED ORDER — VALACYCLOVIR HCL 500 MG OR TABS
500.0000 mg | ORAL_TABLET | Freq: Every day | ORAL | 1 refills | Status: DC
Start: 2019-07-25 — End: 2022-05-15

## 2019-07-25 NOTE — Telephone Encounter (Signed)
Pt is requesting #30 tabs    Since this is a change of qty, will defer to MD for approval

## 2019-07-25 NOTE — Telephone Encounter (Signed)
Patient received a refill for valACYclovir 500 MG tablet   She is going to be traveling and is requesting a refill of 30 tablets.             Order Details  Dose: 500 mg Route: Oral Frequency: Daily   Dispense Quantity: 6 tablet Refills: 2

## 2019-07-26 ENCOUNTER — Encounter (HOSPITAL_BASED_OUTPATIENT_CLINIC_OR_DEPARTMENT_OTHER): Payer: Self-pay | Admitting: Diagnostic Radiology

## 2019-07-29 ENCOUNTER — Other Ambulatory Visit (HOSPITAL_BASED_OUTPATIENT_CLINIC_OR_DEPARTMENT_OTHER): Payer: Self-pay | Admitting: Family Medicine

## 2019-07-29 DIAGNOSIS — N6310 Unspecified lump in the right breast, unspecified quadrant: Secondary | ICD-10-CM

## 2019-08-06 ENCOUNTER — Ambulatory Visit (INDEPENDENT_AMBULATORY_CARE_PROVIDER_SITE_OTHER): Admit: 2019-08-06 | Discharge: 2019-08-06 | Disposition: A | Discharge status: Home / Self Care | Payer: PPO

## 2019-08-06 ENCOUNTER — Encounter (INDEPENDENT_AMBULATORY_CARE_PROVIDER_SITE_OTHER): Payer: Self-pay

## 2019-08-06 ENCOUNTER — Ambulatory Visit (INDEPENDENT_AMBULATORY_CARE_PROVIDER_SITE_OTHER): Payer: PPO | Admitting: Podiatrist

## 2019-08-06 DIAGNOSIS — M25872 Other specified joint disorders, left ankle and foot: Secondary | ICD-10-CM

## 2019-08-06 DIAGNOSIS — M216X9 Other acquired deformities of unspecified foot: Secondary | ICD-10-CM

## 2019-08-06 DIAGNOSIS — G8929 Other chronic pain: Secondary | ICD-10-CM

## 2019-08-06 DIAGNOSIS — M79672 Pain in left foot: Secondary | ICD-10-CM

## 2019-08-06 DIAGNOSIS — M2580 Other specified joint disorders, unspecified joint: Secondary | ICD-10-CM

## 2019-08-06 DIAGNOSIS — M79671 Pain in right foot: Secondary | ICD-10-CM

## 2019-08-06 NOTE — Progress Notes (Signed)
Cassie Contreras is here today to see Dr. Selena Batten.  Please see Dr. Elmyra Ricks notes on today's visit.  While she is here today I scanned reader for new custom orthotics.  She was scanned in subtalar neutral.  She will return in approximately 3 weeks to pick up her new orthotics and will then follow back on an as-needed basis with Dr. Selena Batten.

## 2019-08-06 NOTE — Progress Notes (Signed)
NEW PATIENT EVALUATION    Patient: Cassie Contreras   Patient DOB: 1950-04-01     DOS:  08/06/2019     Accompanied by:  patient was unaccompanied    Chief Complaint: [name] is a 69 year old year old female who presents today with complaint(s):   Chief Complaint   Patient presents with   . Musculoskeletal Problem     Bilateral feet, mostly left foot/ankle pain        History of present Illness:    (history is obtained by MA/RN and edited by Dr. Selena Batten)  Referring provider/PCP: Dr [name], PCP, Seen Dr Bertell Maria in past   The location of the condition is Left foot/ankle ,bottom of foot, and anke catches at times. Here today to discuss custom orthotics. The patient states that the condition has existed for had surgery in 2014, having issues since then, wants to discuss getting new orthotics. It began gradually. The precipitating event was long history of foot and ankle issues, had surgery, now having symptoms that are causing pain. Patient describes the symptoms as tingling, ankle catches at time, bottom of feet sore, back pain/problem L4/L5 degenerated. Patient denies: injury, heel or calf pain. The quality of pain aching, pins and needles and stinging. The level the pain moderate. The condition is is not getting better. The affected area is made worse by walking, pressure. Past treatments/studies include stretches, uses ball for stretching.  The patient states no improvement with treatment. Previous diagnostic test/evaluation: none. Patient's limitation is activities are limited in walking and limited in recreation.   Occupation: Retired   Activity level: walker   Goals: evaluate current condition.       PCP:  [name], MD     PMH:   Past Medical History:   Diagnosis Date   . Abdominal pain    . Anxiety    . Arthritis    . Asthma    . Constipation    . Depression    . GERD (gastroesophageal reflux disease)    . Headache    . Heart palpitations    . History of irritable bowel syndrome    . Hormone disorder     . Metatarsal stress fracture of right foot     TX FROM PAPER CHART     . Primary insomnia    . Squamous cell carcinoma in situ of skin    . Thyroid disease    . UTI (urinary tract infection)         Review of patient's allergies indicates:  Allergies   Allergen Reactions   . Erythromycin Other     Strange smells but no rash or trouble breathing.   . Ibuprofen      TX FROM PAPER CHART     . Pneumovax [Pneumococcal Polysaccharide Vaccine]      Rash/fever (arm)    . Sulfa Antibiotics      TX FROM PAPER CHART          Medications:   Outpatient Medications Prior to Visit   Medication Sig Dispense Refill   . atenolol 25 MG tablet TAKE ONE TABLET BY MOUTH ONCE DAILY 90 tablet 0   . bimatoprost (Latisse) 0.03 % topical solution Apply 1 application topically at bedtime. Apply upper eyelid at base of eyelashes with applicator. Blot excess solution. 3 mL 0   . budesonide 180 MCG/ACT inhaler Inhale 1 puff by mouth.     . clonazePAM 0.5 MG tablet Take 1  tablet (0.5 mg) by mouth daily as needed (anxiety). 20 tablet 0   . cyclobenzaprine 10 MG tablet Take 1 tablet (10 mg) by mouth 3 times a day as needed for muscle spasms. 60 tablet 0   . fluticasone propionate 50 MCG/ACT nasal spray Spray 1 spray into each nostril.     . Ibuprofen-Diphenhydramine Cit (ADVIL PM OR) Take 200 mg by mouth.     Marland Kitchen. omeprazole 20 MG DR capsule Take 1 capsule (20 mg) by mouth daily on an empty stomach. 90 capsule 3   . traMADol 50 MG tablet Take 1 tablet (50 mg) by mouth every 4 hours as needed for moderate pain or severe pain. 30 tablet 0   . tretinoin 0.05 % cream Apply to affected area on face at bedtime. 20 g 0   . Vagifem 10 MCG vaginal tablet INSERT ONE TABLET VAGINALLY THREE TIMES WEEKLY 36 tablet 3   . valACYclovir 500 MG tablet Take 1 tablet (500 mg) by mouth daily. 30 tablet 1     No facility-administered medications prior to visit.         PSH:   Past Surgical History:   Procedure Laterality Date   . Tonsilectomy      Childhood   . URETHRAL  DILATION/DVIU          Family History:  family history includes Heart Attack (age of onset: 2859) in her sister.    Social History:  Social History     Socioeconomic History   . Marital status: Married     Spouse name: Not on file   . Number of children: Not on file   . Years of education: Not on file   . Highest education level: Not on file   Occupational History   . Not on file   Social Needs   . Financial resource strain: Not on file   . Food insecurity     Worry: Not on file     Inability: Not on file   . Transportation needs     Medical: Not on file     Non-medical: Not on file   Tobacco Use   . Smoking status: Former Games developermoker   . Smokeless tobacco: Never Used   Substance and Sexual Activity   . Alcohol use: Yes     Alcohol/week: 1.0 standard drinks     Types: 1 Glasses of wine per week   . Drug use: No   . Sexual activity: Yes     Partners: Male   Lifestyle   . Physical activity     Days per week: Not on file     Minutes per session: Not on file   . Stress: Not on file   Relationships   . Social Wellsite geologistconnections     Talks on phone: Not on file     Gets together: Not on file     Attends religious service: Not on file     Active member of club or organization: Not on file     Attends meetings of clubs or organizations: Not on file     Relationship status: Not on file   . Intimate partner violence     Fear of current or ex partner: Not on file     Emotionally abused: Not on file     Physically abused: Not on file     Forced sexual activity: Not on file   Other Topics Concern   . Not on file   Social  History Narrative   . Not on file        Review of Systems   Constitutional: Negative    Eyes: Deferred   Ears, Nose, Mouth, Throat: Deferred   Cardiovascular: Negative    Respiratory: Negative    Gastrointestinal: Negative   Genitourinary: Deferred   Skin: Negative    Neurological: Negative    Psychiatric: Deferred   Endocrine: Negative    Hematologic/Lymphatic: Negative   Allergic/Immunologic: Deferred     Physical Exam:    There were no vitals filed for this visit.     General: The patient is White Well developed, appearing stated age and in no acute distress, normal affect.    Vascular:   RIGHT:  Dorsalis pedis pulse is palpable.  Posterior tibial pulse is palpable. Capillary filling time note to be immediate  on the distal hallux.      LEFT:  Dorsalis pedis pulse is palpable.  Posterior tibial pulse is palpable. Capillary filling time note to be immediate  on the distal hallux.      Neurologic:  RIGHT:  The sensation is intact on the foot/ankle.  Motor coordination is intact with normal muscle tone.  There is no manifestations of pathological reflexes noted.     LEFT:  The sensation is intact on the foot/ankle.  Motor coordination is intact with normal muscle tone.  There is no manifestations of pathological reflexes noted.      Dermatological:   RIGHT:  The skin note to have normal texture/tone/turgor.  There is no ulceration/open wound or dermatosis present.  Integument coloration is within normal range and temperature is within normal range.  The hair growth is normal.    LEFT:  The skin note to have normal texture/tone/turgor.  There is no ulceration/open wound or dermatosis present.  Integument coloration is within normal range and temperature is within normal range.  The hair growth is normal.    Musculoskeletal:   RIGHT:  There is mild pain on palpation on the fibular sesamoid.  There is no swelling noted over the affect region.  There is no warmth to touch of the affected area.   There is fat pad atrophy noted. The 1st metatarsal is plantarflexed: semirigid. There is no  hindfoot varus/calcaneal varus.     LEFT:  There is mild pain on palpation on the fibular sesamoid.  There is no swelling noted over the affect region.  There is no warmth to touch of the affected area.   There is fat pad atrophy noted. The 1st metatarsal is plantarflexed: semirigid. There is no  hindfoot varus/calcaneal varus.     ORTHOTIC  EVALUATION:  Polypropylene orthotic evaluation revealed gapping on the arch.   The orthotic fit well in the shoes.      Past radiographic exam (X-RAY) result from Epic imaging.    Xr Ankle 3+ View Left    Result Date: 08/06/2019  EXAMINATION: XR ANKLE 3+ VW LEFT CLINICAL INDICATION: pain COMPARISON: Foot April 22, 2015 FINDINGS AND     No soft tissue swelling about the ankle. Punctate well-corticated bony density at the tip of the medial malleolus likely prior injury. No acute fracture. Stable focal lucency in the posterior calcaneus, likely prior surgery.Intact ankle mortise.     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  Discussion included spectrum of treatments available for this condition: continue current care, cortisone injection if symptomatic and diagnostic MRI and ankle arthroscopy surgery if conservative care fail.  After the discussion, today patient elected continue current care.      We have reviewed today's x-ray finding and detail discussion took place with the patient.      Detailed education and discussion took place with the patient regards to fibular sesamoid sesamoiditis, right and left.  We have explained to the patient the possible contributing factor to this condition - biomechanical abnormality.  We have discussed multiple treatment options i.e.- continue current care,  new orthotics, orthotic modification and corticosteroid injection.  The patient elected to try new orthotics.      Following shoe with forefoot rocker was recommended - Altra Paradigm, Leverne Humbles, Hoka Stinson ATR s and New Balance Fresh Foam and where to purchase them.      Orthotic order:  The patient was educated about the purpose of the orthotic.  All questions were  answered and no guarantee was given.  I have ordered functional orthotics.   The orthotic Rx is polypropylene 4.  The order modification is rearfoot posting and Reverse Morton's extension.  Patient will be notify once the orthotic comes in.  Patient will see pedorthist - Don - Pedorthist for evaluation and scanning for orthotic.      Return appointment: follow up after few weeks of orthotic usage      Randa Spike DPM, FACFAS  Reconstructive Ankle and Foot Surgeon  [facility]

## 2019-08-06 NOTE — Progress Notes (Signed)
Accompanied by:  patient was unaccompanied    Referring provider/PCP:  Dr [name], PCP, Seen Dr Bertell Maria in past  The location of the condition is Left foot/ankle ,bottom of foot, and anke catches at times. Here today to discuss custom orthotics   The patient states that the condition has existed for had surgery in 2014, having issues since then, wants to discuss getting new orthotics.    It began gradually.    The precipitating event was long history of foot and ankle issues, had surgery, now having symptoms that are causing pain.    Patient describes the symptoms as tingling, ankle catches at time, bottom of feet sore, back pain/problem L4/L5 degenerated.    Patient denies: injury, heel or calf pain.    The quality of pain aching, pins and needles and stinging.    The level the pain moderate.    The condition is is not getting better.    The affected area is made worse by walking, pressure.    Past treatments/studies include stretches, uses ball for stretching  The patient states no improvement with treatment.    Previous diagnostic test/evaluation:  none.    Patient's limitation is activities are limited in walking and limited in recreation.      Occupation: Retired    Activity level: walker    Goals: evaluate current condition.

## 2019-08-22 ENCOUNTER — Encounter (INDEPENDENT_AMBULATORY_CARE_PROVIDER_SITE_OTHER): Payer: Self-pay

## 2019-08-22 NOTE — Progress Notes (Signed)
Cassie Contreras's Custom Orthotics have arrived.  She has an appt on 8/26 for delivery.

## 2019-08-26 ENCOUNTER — Ambulatory Visit
Admit: 2019-08-26 | Discharge: 2019-08-26 | Disposition: A | Discharge status: Home / Self Care | Payer: PPO | Attending: Diagnostic Radiology | Admitting: Diagnostic Radiology

## 2019-08-26 ENCOUNTER — Other Ambulatory Visit (HOSPITAL_BASED_OUTPATIENT_CLINIC_OR_DEPARTMENT_OTHER): Payer: PPO

## 2019-08-26 DIAGNOSIS — N6310 Unspecified lump in the right breast, unspecified quadrant: Secondary | ICD-10-CM | POA: Insufficient documentation

## 2019-09-18 ENCOUNTER — Ambulatory Visit (INDEPENDENT_AMBULATORY_CARE_PROVIDER_SITE_OTHER): Payer: PPO | Admitting: Podiatrist

## 2019-09-18 ENCOUNTER — Ambulatory Visit (INDEPENDENT_AMBULATORY_CARE_PROVIDER_SITE_OTHER): Payer: PPO

## 2019-09-18 ENCOUNTER — Ambulatory Visit (INDEPENDENT_AMBULATORY_CARE_PROVIDER_SITE_OTHER)
Admit: 2019-09-18 | Discharge: 2019-09-18 | Disposition: A | Discharge status: Home / Self Care | Payer: PPO | Source: Home / Self Care

## 2019-09-18 DIAGNOSIS — M79671 Pain in right foot: Secondary | ICD-10-CM

## 2019-09-18 DIAGNOSIS — M87076 Idiopathic aseptic necrosis of unspecified foot: Secondary | ICD-10-CM

## 2019-09-18 DIAGNOSIS — G8929 Other chronic pain: Secondary | ICD-10-CM

## 2019-09-18 DIAGNOSIS — M79672 Pain in left foot: Secondary | ICD-10-CM

## 2019-09-18 DIAGNOSIS — M25872 Other specified joint disorders, left ankle and foot: Secondary | ICD-10-CM

## 2019-09-18 DIAGNOSIS — S92201A Fracture of unspecified tarsal bone(s) of right foot, initial encounter for closed fracture: Secondary | ICD-10-CM

## 2019-09-18 DIAGNOSIS — M216X9 Other acquired deformities of unspecified foot: Secondary | ICD-10-CM

## 2019-09-18 DIAGNOSIS — M2580 Other specified joint disorders, unspecified joint: Secondary | ICD-10-CM

## 2019-09-18 NOTE — Progress Notes (Signed)
Patient: Cassie Contreras   Patient DOB: Aug 03, 1950     DOS:  09/18/2019     Accompanied by:  patient was unaccompanied    Chief Complaint: Cassie Contreras is a 69 year old year old female who is former patient presents today with new complaint(s): new acute on set of pain on right sub 1st metatarsal    History of present Illness:    (history is obtained by MA/RN and edited by Dr. Selena Batten)  Cassie Contreras is a 69 year old year old female who presents today for re-evaluation of sesamoidsesamoiditis, right .  The patient states  It was feeling better until two weeks ago was riding a stationary bike while on vacation.   The current condition has existed for two weeks since acute pain.  Patient describes the symptoms as acutely sharp, knife stabbing her, now it feels much better, but wants guidance on how to pursue from here.   The level the pain minimal now .  The condition is worse when pressure.   Current treatments/recent studies include ice, elevation.     The response to the treatment is helpful .   Patient's limitation is being active.       Goals: evaluate current condition    PCP:  Hei, Darlyn Chamber, MD     PMH:   Past Medical History:   Diagnosis Date   . Abdominal pain    . Anxiety    . Arthritis    . Asthma    . Constipation    . Depression    . GERD (gastroesophageal reflux disease)    . Headache    . Heart palpitations    . History of irritable bowel syndrome    . Hormone disorder    . Metatarsal stress fracture of right foot     TX FROM PAPER CHART     . Primary insomnia    . Squamous cell carcinoma in situ of skin    . Thyroid disease    . UTI (urinary tract infection)         Review of patient's allergies indicates:  Allergies   Allergen Reactions   . Erythromycin Other     Strange smells but no rash or trouble breathing.   . Ibuprofen      TX FROM PAPER CHART     . Pneumovax [Pneumococcal Polysaccharide Vaccine]      Rash/fever (arm)    . Sulfa Antibiotics      TX FROM PAPER CHART           Medications:   Outpatient Medications Prior to Visit   Medication Sig Dispense Refill   . atenolol 25 MG tablet TAKE ONE TABLET BY MOUTH ONCE DAILY 90 tablet 0   . bimatoprost (Latisse) 0.03 % topical solution Apply 1 application topically at bedtime. Apply upper eyelid at base of eyelashes with applicator. Blot excess solution. 3 mL 0   . budesonide 180 MCG/ACT inhaler Inhale 1 puff by mouth.     . clonazePAM 0.5 MG tablet Take 1 tablet (0.5 mg) by mouth daily as needed (anxiety). 20 tablet 0   . cyclobenzaprine 10 MG tablet Take 1 tablet (10 mg) by mouth 3 times a day as needed for muscle spasms. 60 tablet 0   . fluticasone propionate 50 MCG/ACT nasal spray Spray 1 spray into each nostril.     . Ibuprofen-Diphenhydramine Cit (ADVIL PM OR) Take 200 mg by mouth.     Marland Kitchen  omeprazole 20 MG DR capsule Take 1 capsule (20 mg) by mouth daily on an empty stomach. 90 capsule 3   . traMADol 50 MG tablet Take 1 tablet (50 mg) by mouth every 4 hours as needed for moderate pain or severe pain. 30 tablet 0   . tretinoin 0.05 % cream Apply to affected area on face at bedtime. 20 g 0   . Vagifem 10 MCG vaginal tablet INSERT ONE TABLET VAGINALLY THREE TIMES WEEKLY 36 tablet 3   . valACYclovir 500 MG tablet Take 1 tablet (500 mg) by mouth daily. 30 tablet 1     No facility-administered medications prior to visit.         PSH:   Past Surgical History:   Procedure Laterality Date   . Tonsilectomy      Childhood   . URETHRAL DILATION/DVIU          Family History:  family history includes Heart Attack (age of onset: 8559) in her sister.    Social History:  Social History     Socioeconomic History   . Marital status: Married     Spouse name: Not on file   . Number of children: Not on file   . Years of education: Not on file   . Highest education level: Not on file   Occupational History   . Not on file   Social Needs   . Financial resource strain: Not on file   . Food insecurity     Worry: Not on file     Inability: Not on file   .  Transportation needs     Medical: Not on file     Non-medical: Not on file   Tobacco Use   . Smoking status: Former Games developermoker   . Smokeless tobacco: Never Used   Substance and Sexual Activity   . Alcohol use: Yes     Alcohol/week: 1.0 standard drinks     Types: 1 Glasses of wine per week   . Drug use: No   . Sexual activity: Yes     Partners: Male   Lifestyle   . Physical activity     Days per week: Not on file     Minutes per session: Not on file   . Stress: Not on file   Relationships   . Social Wellsite geologistconnections     Talks on phone: Not on file     Gets together: Not on file     Attends religious service: Not on file     Active member of club or organization: Not on file     Attends meetings of clubs or organizations: Not on file     Relationship status: Not on file   . Intimate partner violence     Fear of current or ex partner: Not on file     Emotionally abused: Not on file     Physically abused: Not on file     Forced sexual activity: Not on file   Other Topics Concern   . Not on file   Social History Narrative   . Not on file        ROS is positive for musculoskeletal as mentioned in the HPI and as stated in the past medical history.  The patient's review of symptoms is otherwise unremarkable and documented in the electronic health record on 09/18/2019.    Physical Exam:   There were no vitals filed for this visit.     General: The patient  is White, Well developed, appearing stated age and in no acute distress      Vascular:   RIGHT:  Dorsalis pedis pulse is palpable.  Posterior tibial pulse is palpable. Capillary filling time note to be immediate  on the distal hallux.      LEFT:  Dorsalis pedis pulse is palpable.  Posterior tibial pulse is palpable. Capillary filling time note to be immediate  on the distal hallux.      Neurologic:  RIGHT:  The sensation is intact on the foot/ankle.  Motor coordination is intact with normal muscle tone.  There is no manifestations of pathological reflexes noted.     LEFT:  The  sensation is intact on the foot/ankle.  Motor coordination is intact with normal muscle tone.  There is no manifestations of pathological reflexes noted.      Dermatological:   RIGHT:   Upon inspection, the skin note to have normal texture/tone/turgor and forefoot fat pad atrophy.  There is no ulceration/open wound or dermatosis present.  Integument coloration is within normal range and temperature is within normal range.  The hair growth is normal.    LEFT:   Upon inspection, the skin note to have normal texture/tone/turgor and forefoot fat pad atrophy.   There is no ulceration/open wound or dermatosis present.  Integument coloration is within normal range and temperature is within normal range.  The hair growth is normal.      Musculoskeletal:   RIGHT:  There is moderate pain on palpation on the fibular sesamoid.  There is swelling noted over the affect region.  There is no warmth to touch of the affected area.   There is fat pad atrophy noted. The 1st metatarsal is plantarflexed: semirigid.     LEFT:  There is minimal to mild pain on palpation on the fibular sesamoid.  There is no swelling noted over the affect region.  There is no warmth to touch of the affected area.   There is fat pad atrophy noted. The 1st metatarsal is plantarflexed: semirigid.     DIAGNOSTIC IMAGING:    Right:  AP/LAT/Sesamoid view foot (CPT- [id number]) views     Finding:   There is comminuted fracture noted on the fibular sesamoid and it is sclerotic.                   We have reviewed today's x-ray finding and detail discussion took place with the patient.      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  We have reviewed today's x-ray and previous  X-ray finding and detail discussion took place with the patient.  Explained that normal bone healing time is 6-8 weeks.  Answered all of patient's questions.  We have discuss following treatment options:  continue with current care, P.R.I.C.E., NWB using cast boot with crutches and/or knee scooter and MRI/CT scan to better assess the fracture, and possible surgery mainly removing the fibular sesamoid.   After the discussion, the patient elected NWB using cast boot with crutches and/or knee scooter for 6 weeks.       Cast boot/camboot dispensed:  A cast boot/camboot was fitted and dispensed by Diane.  Due to the patient's diagnosis and related symptoms this is medically necessary  for treatment.  The function of this device is to restrict and limit motion, provide stabilization, and immobilization to the affected area.  The goals and function of this device was explained in detail to the patient.  The device appeared to be fitting well and the patient states that the device is comfortable at this time.  At the time the device was dispensed, it was suitable for the condition and was not substandard.  The boot is under warranty and precautions were reviewed - if ambulating contralateral leg should have a lift to accommodate limb length discrepancy to help avoid hip/back pain.  Prescription/Rx for dispensing one boot made today, diagnosis code on chart note today, the patients current condition needs protective weight bearing status, [facility] paperwork reviewed and dispensed.  Additionally, risk and signs/symptom of DVT was discussed.  Patient was instructed to call for any concern/issue.     Return appointment: follow up in 5-6 weeks and x-ray AP/LAT/ MO/ Sesamoid views foot    TIME SPENT:  I spent a total of 40 minutes for the patient's care on the date of the service.       Randa Spike DPM, FACFAS  Reconstructive Ankle and Foot Surgeon  [facility]

## 2019-09-18 NOTE — Progress Notes (Signed)
Accompanied by:  patient was unaccompanied    Cassie Contreras is a 69 year old year old female who presents today for re-evaluation of sesamoid sesamoiditis, right .    The patient states  It was feeling better until two weeks ago  was riding a stationary bike while on vacation.     The current condition has existed for two weeks since acute pain.    Patient describes the symptoms as acutely sharp, knife stabbing her, now it feels much better, but wants guidance on how to pursue from here   The level the pain minimal now .    The condition is worse when pressure.     Current treatments/recent studies include ice, elevation.     The response to the treatment is helpful .     Patient's limitation is being active.

## 2019-09-18 NOTE — Progress Notes (Signed)
Cassie Contreras is here today to see Dr. Selena Batten.  Please see his notes on today's visit.  While she is here today I fit her with her new orthotics.  Break-in instructions were given.  She has had orthotics previously through an outside provider.  Because of this she should not have any new issues with the orthotics itself.  She is going to utilize the right orthotic in her walking boot as well.  She will return in approximately 4 weeks for follow-up with Dr. Selena Batten or myself sooner if there are issues or problems.

## 2019-09-30 ENCOUNTER — Encounter (INDEPENDENT_AMBULATORY_CARE_PROVIDER_SITE_OTHER): Payer: Self-pay | Admitting: Podiatrist

## 2019-10-03 ENCOUNTER — Other Ambulatory Visit (INDEPENDENT_AMBULATORY_CARE_PROVIDER_SITE_OTHER): Payer: Self-pay | Admitting: Family Medicine

## 2019-10-03 DIAGNOSIS — L709 Acne, unspecified: Secondary | ICD-10-CM

## 2019-10-06 MED ORDER — TRETINOIN 0.05 % EX CREA
TOPICAL_CREAM | CUTANEOUS | 0 refills | Status: DC
Start: 2019-10-06 — End: 2019-11-06

## 2019-10-07 ENCOUNTER — Telehealth (INDEPENDENT_AMBULATORY_CARE_PROVIDER_SITE_OTHER): Payer: Self-pay

## 2019-10-07 NOTE — Telephone Encounter (Signed)
See attached PA request       Routing to Pharmacy Prior Authorization Group

## 2019-10-15 NOTE — Telephone Encounter (Signed)
Prior Authorization Request Pending    Prescription Insurance: [employer] [place]  Pharmacy: [id number] [place] [address] [phone] [place] [phone]    Medication: Tretinoin 0.05 % cream   PAC Workflow Exemption Request: No  Date Submitted: 10-15-2019    Type of Request:    [  ] Fax:     [  ] Telephone:    [  ] Other:     [  ] EPA:    [X]  Cover My Meds Key    Please allow up to 3-5 business days for review/response.

## 2019-10-16 NOTE — Telephone Encounter (Signed)
Prior Authorization Approval    Patient has received insurance approval for coverage of the following medication:    Prescription Insurance: [employer] [place]  Pharmacy: [id number] [phone] [address] [place] [phone]    Medication: Tretinoin 0.05 % cream   Approval Response Requested: NO  Approved dates: 07-18-2019 thru 10-15-2020  Approval #: [id number]    PHARMACY NOTIFIED?   Fax    Additional Comments:  Approval letter has been uploaded into media.  10/16/2019

## 2019-10-20 ENCOUNTER — Other Ambulatory Visit: Payer: Self-pay

## 2019-10-20 ENCOUNTER — Ambulatory Visit (VACCINATION_CLINIC): Payer: PPO

## 2019-10-20 DIAGNOSIS — Z23 Encounter for immunization: Secondary | ICD-10-CM

## 2019-10-21 ENCOUNTER — Ambulatory Visit (INDEPENDENT_AMBULATORY_CARE_PROVIDER_SITE_OTHER): Admit: 2019-10-21 | Discharge: 2019-10-21 | Disposition: A | Discharge status: Home / Self Care | Payer: PPO

## 2019-10-21 ENCOUNTER — Ambulatory Visit (INDEPENDENT_AMBULATORY_CARE_PROVIDER_SITE_OTHER): Payer: PPO

## 2019-10-21 ENCOUNTER — Ambulatory Visit (INDEPENDENT_AMBULATORY_CARE_PROVIDER_SITE_OTHER): Payer: PPO | Admitting: Podiatrist

## 2019-10-21 DIAGNOSIS — G5791 Unspecified mononeuropathy of right lower limb: Secondary | ICD-10-CM

## 2019-10-21 DIAGNOSIS — M79671 Pain in right foot: Secondary | ICD-10-CM

## 2019-10-21 DIAGNOSIS — M7742 Metatarsalgia, left foot: Secondary | ICD-10-CM

## 2019-10-21 DIAGNOSIS — M216X9 Other acquired deformities of unspecified foot: Secondary | ICD-10-CM

## 2019-10-21 DIAGNOSIS — S92201A Fracture of unspecified tarsal bone(s) of right foot, initial encounter for closed fracture: Secondary | ICD-10-CM

## 2019-10-21 DIAGNOSIS — M79672 Pain in left foot: Secondary | ICD-10-CM

## 2019-10-21 NOTE — Progress Notes (Signed)
Cassie Contreras had appt's w/Dr. Selena Batten and myself today.    During her time w/me I was asked to adjust the current Custom Orthotics she received a month ago.    It was required to take all material off the devices and add new that would accommodate the adjustments.    1/8 in Spenco topcovers B/L  1/8 in PPT full length w/Facial accommodations B/L    There will be no charge for this visit with me.

## 2019-10-21 NOTE — Patient Instructions (Signed)
Choose appropriate shoes/sandal/insert as marked by provider.

## 2019-10-21 NOTE — Progress Notes (Signed)
FOLLOW UP SOAP NOTE    Patient: Cassie Contreras   Patient DOB: [birth year]     DOS:  10/21/2019     Accompanied by:  patient was unaccompanied    Subjective:   (history is obtained by MA/RN and edited by Dr. Selena BattenKim)  Cassie AstRita Mae Iden is a 69 year old year old female who presents today for re-evaluation of comminutedfibular sesamoidfracture with possible AVN,right.  The patient states still a problem but 5% better.   The current condition has existed for on and off over a month.  Patient describes the symptoms as on and off sore and painful still.  The level the pain moderate.  The condition is worse when walking, being active.   Current treatments/recent studies include boot, inserts, ice, elevatation.   The response to the treatment is not working the way she hoped.   Patient's limitation is being active.   She wants to discuss the nerve study and mri now.  The new orthotic cause pain at sesamoid area.      PCP:  Burns SpainHei, Thomas Kou, MD     PMH:   Past Medical History:   Diagnosis Date   . Abdominal pain    . Anxiety    . Arthritis    . Asthma    . Constipation    . Depression    . GERD (gastroesophageal reflux disease)    . Headache    . Heart palpitations    . History of irritable bowel syndrome    . Hormone disorder    . Metatarsal stress fracture of right foot     TX FROM PAPER CHART     . Primary insomnia    . Squamous cell carcinoma in situ of skin    . Thyroid disease    . UTI (urinary tract infection)         Review of patient's allergies indicates:  Allergies   Allergen Reactions   . Erythromycin Other     Strange smells but no rash or trouble breathing.   . Ibuprofen      TX FROM PAPER CHART     . Pneumovax [Pneumococcal Polysaccharide Vaccine]      Rash/fever (arm)    . Sulfa Antibiotics      TX FROM PAPER CHART          Medications:   Outpatient Medications Prior to Visit   Medication Sig Dispense Refill   . atenolol 25 MG tablet TAKE ONE TABLET BY MOUTH ONCE DAILY 90 tablet 0   . bimatoprost (Latisse)  0.03 % topical solution Apply 1 application topically at bedtime. Apply upper eyelid at base of eyelashes with applicator. Blot excess solution. 3 mL 0   . budesonide 180 MCG/ACT inhaler Inhale 1 puff by mouth.     . clonazePAM 0.5 MG tablet Take 1 tablet (0.5 mg) by mouth daily as needed (anxiety). 20 tablet 0   . cyclobenzaprine 10 MG tablet Take 1 tablet (10 mg) by mouth 3 times a day as needed for muscle spasms. 60 tablet 0   . fluticasone propionate 50 MCG/ACT nasal spray Spray 1 spray into each nostril.     . Ibuprofen-Diphenhydramine Cit (ADVIL PM OR) Take 200 mg by mouth.     Marland Kitchen. omeprazole 20 MG DR capsule Take 1 capsule (20 mg) by mouth daily on an empty stomach. 90 capsule 3   . traMADol 50 MG tablet Take 1 tablet (50 mg) by mouth every 4 hours as  needed for moderate pain or severe pain. 30 tablet 0   . tretinoin 0.05 % cream APPLY TO AFFECTED AREA(S) ON FACE AT BEDTIME 20 g 0   . Vagifem 10 MCG vaginal tablet INSERT ONE TABLET VAGINALLY THREE TIMES WEEKLY 36 tablet 3   . valACYclovir 500 MG tablet Take 1 tablet (500 mg) by mouth daily. 30 tablet 1     No facility-administered medications prior to visit.         PSH:   Past Surgical History:   Procedure Laterality Date   . Tonsilectomy      Childhood   . URETHRAL DILATION/DVIU          Family History:  family history includes Heart Attack (age of onset: 97) in her sister.    Social History:  Social History     Socioeconomic History   . Marital status: Married     Spouse name: Not on file   . Number of children: Not on file   . Years of education: Not on file   . Highest education level: Not on file   Occupational History   . Not on file   Social Needs   . Financial resource strain: Not on file   . Food insecurity     Worry: Not on file     Inability: Not on file   . Transportation needs     Medical: Not on file     Non-medical: Not on file   Tobacco Use   . Smoking status: Former Games developer   . Smokeless tobacco: Never Used   Substance and Sexual Activity   .  Alcohol use: Yes     Alcohol/week: 1.0 standard drinks     Types: 1 Glasses of wine per week   . Drug use: No   . Sexual activity: Yes     Partners: Male   Lifestyle   . Physical activity     Days per week: Not on file     Minutes per session: Not on file   . Stress: Not on file   Relationships   . Social Wellsite geologist on phone: Not on file     Gets together: Not on file     Attends religious service: Not on file     Active member of club or organization: Not on file     Attends meetings of clubs or organizations: Not on file     Relationship status: Not on file   . Intimate partner violence     Fear of current or ex partner: Not on file     Emotionally abused: Not on file     Physically abused: Not on file     Forced sexual activity: Not on file   Other Topics Concern   . Not on file   Social History Narrative   . Not on file        Physical Exam:   There were no vitals filed for this visit.     General: The patient is White, Well developed, appearing stated age and in no acute distress      Vascular:   RIGHT:  Dorsalis pedis pulse is palpable.  Posterior tibial pulse is palpable. Capillary filling time note to be immediate  on the distal hallux.      LEFT:  Dorsalis pedis pulse is palpable.  Posterior tibial pulse is palpable. Capillary filling time note to be immediate  on the distal hallux.  Neurologic:  RIGHT:  The sensation is intact on the foot/ankle.  Motor coordination is intact with normal muscle tone.  There is no manifestations of pathological reflexes noted.     LEFT:  The sensation is intact on the foot/ankle.  Motor coordination is intact with normal muscle tone.  There is no manifestations of pathological reflexes noted.      Dermatological:   RIGHT:  Upon inspection, the skin note to have normal texture/tone/turgor.  There is no ulceration/open wound or dermatosis present.  Integument coloration is within normal range and temperature is within normal range.  The hair growth is normal.       LEFT:  Upon inspection, the skin note to have normal texture/tone/turgor.  There is no ulceration/open wound or dermatosis present.  Integument coloration is within normal range and temperature is within normal range.  The hair growth is normal.      Musculoskeletal:   RIGHT:  There is moderate pain on palpation on the fibular sesamoid.  There is swelling noted over the affect region.  There is no warmth to touch of the affected area.   There is fat pad atrophy noted. The 1st metatarsal is plantarflexed: semirigid.   The plantar fascia is tight with Hubscher maneuver with small nodule.  There is mild equinus noted.    LEFT:  There is mild-moderate tenderness on the 2nd, 3rd and 4th metatarsal plantar head without swelling.  There is no warmth to touch.  There is no Mulder click noted on 3rd intermetatarsal space.  There is forefoot deformity noted where 1st ray has increased sagittal excursion.  There is no digit deformity noted.  There is no contracture of the MTPJ.  There is mild equinus noted.   The plantar fascia is tight with Hubscher maneuver.     DIAGNOSTIC IMAGING:   Right:  AP/LAT/MO/Sesamoid view foot (CPT- 40814) views   Left:  AP/LAT/MO/Sesamoid view foot (CPT- 48185) views     Finding:   Right:  There is comminuted fracture noted on the fibular sesamoid and it is LESS sclerotic    Left:  The sesamoid bones were evaluated and are normal.  There is rearfoot deformity which involves which involves pes cavus deformity and which involves high calcaneal inclination.      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 The patient was given an opportunity to ask questions.  We have discuss  following options of care: continue current treatment regimen, diagnostic test - MRI, orthotic modification and surgical treatment.   After the discussion, today patient elected diagnostic test - MRI and orthotic modification.      Order:  MRI without contrast on right foot.  Patient's prior treatment/diagnostic test:  X-ray    We have reviewed today's x-ray finding and detail discussion took place with the patient.      Detailed education and discussion took place with the patient regards to 2nd, 3rd and 4th metatarsal overload, left.  We have explained to the patient that factors that may contribute to this conditions are: biomechanical abnormality.  We have discussed multiple treatment options:  continue current care, avoid barefoot, stable shoe gear and metatarsal support.  The patient elected to try avoid barefoot,  stable shoe gear and orthotic modification.      The patient was educated regards to appropriate shoes and following shoes were recommended:  Hoka Bondi, Hoka Arahi and [employer] (trail) and where to purchase them.     ORTHOTIC MODIFICATION:  Polypropylene orthotic modification was perform by Lorin Picket - pedorthist which entails add 1/4" felt metatarsal pad, add poron cushion to entire device and plantar fascia groove.  Patient states improvement after modification.      Return appointment: follow up for MRI review    Randa Spike DPM, FACFAS  Reconstructive Ankle and Foot Surgeon  [facility]

## 2019-10-21 NOTE — Progress Notes (Signed)
Accompanied by:  patient was unaccompanied    Cassie Contreras is a 69 year old year old female who presents today for re-evaluation of comminuted fibular sesamoid fracture with possible AVN, right.    The patient states still a problem.     The current condition has existed for on and off over a month.    Patient describes the symptoms as on and off sore and painful still     The level the pain moderate.    The condition is worse when walking, being active.     Current treatments/recent studies include boot, inserts, ice, elevatation.     The response to the treatment is not working the way she hoped.     Patient's limitation is being active.     Wants to discuss the nerve study and mri now

## 2019-10-23 ENCOUNTER — Telehealth (INDEPENDENT_AMBULATORY_CARE_PROVIDER_SITE_OTHER): Payer: Self-pay | Admitting: Podiatrist

## 2019-10-23 NOTE — Telephone Encounter (Signed)
PT has MRI scheduled for 10/12, and would like Dr. Selena Batten to prescribe something to relax her for the procedure.       Would like a call back.

## 2019-10-24 ENCOUNTER — Other Ambulatory Visit (INDEPENDENT_AMBULATORY_CARE_PROVIDER_SITE_OTHER): Payer: Self-pay | Admitting: Podiatrist

## 2019-10-24 DIAGNOSIS — M87076 Idiopathic aseptic necrosis of unspecified foot: Secondary | ICD-10-CM

## 2019-10-24 NOTE — Telephone Encounter (Signed)
Talked with pt, sent over diazepam for pre mri for Dr Selena Batten to sign off on

## 2019-10-27 MED ORDER — DIAZEPAM 2 MG OR TABS
2.0000 mg | ORAL_TABLET | ORAL | 0 refills | Status: DC
Start: 2019-10-27 — End: 2020-02-14

## 2019-11-04 ENCOUNTER — Encounter (INDEPENDENT_AMBULATORY_CARE_PROVIDER_SITE_OTHER): Payer: Self-pay | Admitting: Family Medicine

## 2019-11-04 ENCOUNTER — Other Ambulatory Visit (HOSPITAL_BASED_OUTPATIENT_CLINIC_OR_DEPARTMENT_OTHER): Payer: PPO

## 2019-11-04 NOTE — Telephone Encounter (Signed)
FYI to PCP.  Patient states atenolol 25mg  is making her sleepy and have little energy    Patient started taking atenolol 1/27 at wellness visit    Please advise if you would like patient to stop takiung or change to something else

## 2019-11-05 ENCOUNTER — Other Ambulatory Visit (INDEPENDENT_AMBULATORY_CARE_PROVIDER_SITE_OTHER): Payer: Self-pay | Admitting: Family Medicine

## 2019-11-05 ENCOUNTER — Encounter (INDEPENDENT_AMBULATORY_CARE_PROVIDER_SITE_OTHER): Payer: Self-pay | Admitting: Family Medicine

## 2019-11-05 ENCOUNTER — Other Ambulatory Visit: Payer: Self-pay

## 2019-11-05 DIAGNOSIS — L709 Acne, unspecified: Secondary | ICD-10-CM

## 2019-11-05 NOTE — Telephone Encounter (Signed)
Updated patient's immunizations record.

## 2019-11-06 MED ORDER — TRETINOIN 0.05 % EX CREA
TOPICAL_CREAM | CUTANEOUS | 0 refills | Status: DC
Start: 2019-11-06 — End: 2020-05-31

## 2019-11-06 NOTE — Telephone Encounter (Signed)
This medication is outside of the [facility]'s protocols. Please sign and close the encounter if you approve:     Tretinoin- last addressed on 10/17/17    If this medication is denied please have your staff inform the patient and schedule an appointment if necessary.

## 2019-11-07 ENCOUNTER — Ambulatory Visit
Admission: RE | Admit: 2019-11-07 | Discharge: 2019-11-07 | Disposition: A | Discharge status: Home / Self Care | Payer: PPO | Attending: Diagnostic Radiology | Admitting: Diagnostic Radiology

## 2019-11-07 ENCOUNTER — Encounter (INDEPENDENT_AMBULATORY_CARE_PROVIDER_SITE_OTHER): Payer: PPO | Admitting: Podiatrist

## 2019-11-07 DIAGNOSIS — X58XXXA Exposure to other specified factors, initial encounter: Secondary | ICD-10-CM | POA: Insufficient documentation

## 2019-11-07 DIAGNOSIS — S92201A Fracture of unspecified tarsal bone(s) of right foot, initial encounter for closed fracture: Secondary | ICD-10-CM | POA: Insufficient documentation

## 2019-11-11 ENCOUNTER — Other Ambulatory Visit: Payer: Self-pay

## 2019-11-11 ENCOUNTER — Ambulatory Visit (INDEPENDENT_AMBULATORY_CARE_PROVIDER_SITE_OTHER): Payer: PPO | Admitting: Podiatrist

## 2019-11-11 DIAGNOSIS — S92201A Fracture of unspecified tarsal bone(s) of right foot, initial encounter for closed fracture: Secondary | ICD-10-CM

## 2019-11-11 DIAGNOSIS — M87076 Idiopathic aseptic necrosis of unspecified foot: Secondary | ICD-10-CM

## 2019-11-11 NOTE — Progress Notes (Signed)
Accompanied by:  patient was unaccompanied    Cassie Contreras is a 69 year old year old female who presents today for MRI review of 2nd, 3rd and 4th metatarsal overload, left..    The patient states same.     The current condition has existed for many months.    Patient describes the symptoms as sore and painful.     The level the pain moderate.    The condition is worse when active.     Current treatments/recent studies include shoes, orthotics.     The response to the treatment is not working.     Patient's limitation is being active.

## 2019-11-11 NOTE — Progress Notes (Signed)
Patient: Cassie Contreras   Patient DOB: Jun 20, 1950     DOS:  11/11/2019     Accompanied by:  patient was unaccompanied    Subjective:  (history is obtained by MA/RN and edited by Dr. Selena Batten)  Joycelyn Liska is a 69 year old year old female who presents today for MRI review of AVN of fibular sesamoid, left..  The patient states same.   The current condition has existed for many months.  Patient describes the symptoms as sore and painful.     The level the pain moderate.  The condition is worse when active.   Current treatments/recent studies include shoes, orthotics.     The response to the treatment is not working.   Patient's limitation is being active.       Goals: evaluate current condition    PCP:  Hei, Darlyn Chamber, MD     PMH:   Past Medical History:   Diagnosis Date    Abdominal pain     Anxiety     Arthritis     Asthma     Constipation     Depression     GERD (gastroesophageal reflux disease)     Headache     Heart palpitations     History of irritable bowel syndrome     Hormone disorder     Metatarsal stress fracture of right foot     TX FROM PAPER CHART      Primary insomnia     Squamous cell carcinoma in situ of skin     Thyroid disease     UTI (urinary tract infection)         Review of patient's allergies indicates:  Allergies   Allergen Reactions    Erythromycin Other     Strange smells but no rash or trouble breathing.    Ibuprofen      TX FROM PAPER CHART      Pneumovax [Pneumococcal Polysaccharide Vaccine]      Rash/fever (arm)     Sulfa Antibiotics      TX FROM PAPER CHART          Medications:   Outpatient Medications Prior to Visit   Medication Sig Dispense Refill    atenolol 25 MG tablet TAKE ONE TABLET BY MOUTH ONCE DAILY 90 tablet 0    bimatoprost (Latisse) 0.03 % topical solution Apply 1 application topically at bedtime. Apply upper eyelid at base of eyelashes with applicator. Blot excess solution. 3 mL 0    budesonide 180 MCG/ACT inhaler Inhale 1 puff by mouth.       clonazePAM 0.5 MG tablet Take 1 tablet (0.5 mg) by mouth daily as needed (anxiety). 20 tablet 0    cyclobenzaprine 10 MG tablet Take 1 tablet (10 mg) by mouth 3 times a day as needed for muscle spasms. 60 tablet 0    diazePAM 2 MG tablet Take 1 tablet (2 mg) by mouth 30 minutes before procedure or surgery. For pre MRI for claustrophobias 2 tablet 0    fluticasone propionate 50 MCG/ACT nasal spray Spray 1 spray into each nostril.      Ibuprofen-Diphenhydramine Cit (ADVIL PM OR) Take 200 mg by mouth.      omeprazole 20 MG DR capsule Take 1 capsule (20 mg) by mouth daily on an empty stomach. 90 capsule 3    traMADol 50 MG tablet Take 1 tablet (50 mg) by mouth every 4 hours as needed for moderate pain or severe pain. 30  tablet 0    tretinoin 0.05 % cream APPLY TO AFFECTED AREA(S) ON FACE AT BEDTIME 20 g 0    Vagifem 10 MCG vaginal tablet INSERT ONE TABLET VAGINALLY THREE TIMES WEEKLY 36 tablet 3    valACYclovir 500 MG tablet Take 1 tablet (500 mg) by mouth daily. 30 tablet 1     No facility-administered medications prior to visit.         PSH:   Past Surgical History:   Procedure Laterality Date    Tonsilectomy      Childhood    URETHRAL DILATION/DVIU          Family History:  family history includes Heart Attack (age of onset: 57) in her sister.    Social History:  Social History     Socioeconomic History    Marital status: Married     Spouse name: Not on file    Number of children: Not on file    Years of education: Not on file    Highest education level: Not on file   Occupational History    Not on file   Social Needs    Financial resource strain: Not on file    Food insecurity     Worry: Not on file     Inability: Not on file    Transportation needs     Medical: Not on file     Non-medical: Not on file   Tobacco Use    Smoking status: Former Smoker    Smokeless tobacco: Never Used   Substance and Sexual Activity    Alcohol use: Yes     Alcohol/week: 1.0 standard drinks     Types: 1 Glasses of  wine per week    Drug use: No    Sexual activity: Yes     Partners: Male   Lifestyle    Physical activity     Days per week: Not on file     Minutes per session: Not on file    Stress: Not on file   Relationships    Social connections     Talks on phone: Not on file     Gets together: Not on file     Attends religious service: Not on file     Active member of club or organization: Not on file     Attends meetings of clubs or organizations: Not on file     Relationship status: Not on file    Intimate partner violence     Fear of current or ex partner: Not on file     Emotionally abused: Not on file     Physically abused: Not on file     Forced sexual activity: Not on file   Other Topics Concern    Not on file   Social History Narrative    Not on file        Physical Exam:   There were no vitals filed for this visit.     General: The patient is White, Well developed, appearing stated age and in no acute distress      Vascular:   LEFT:  Dorsalis pedis pulse is palpable.  Posterior tibial pulse is palpable. Capillary filling time note to be immediate  on the distal hallux.      Neurologic:  LEFT:  The sensation is intact on the foot/ankle.  Motor coordination is intact with normal muscle tone.  There is no manifestations of pathological reflexes noted.      Dermatological:  LEFT:  Upon inspection, the skin note to have normal texture/tone/turgor.  There is no ulceration/open wound or dermatosis present.  Integument coloration is within normal range and temperature is within normal range.  The hair growth is normal.      Musculoskeletal:   LEFT:  There is mild-moderate pain on palpation on the fibular sesamoid.  There is no swelling noted over the affect region.  There is no warmth to touch of the affected area.   There is no fat pad atrophy noted. The 1st metatarsal is plantarflexed. There is cavus foot without hindfoot varus/calcaneal varus.     Past radiographic exam (MRI) result from [facility] imaging.    Mri  Foot Wo Contrast Right    Result Date: 11/07/2019  EXAMINATION: MRI FOOT W/O CONTRAST RIGHT CLINICAL INDICATION: pain along fibular sesamoid. Closed displaced fracture of tarsal bone of right foot, initial encounter TECHNIQUE: MSK MR 56 Small FOV through 1st MTP joint WO Contrast No contraindications to MRI scanning were identified by the departmental screening protocol. Multi-planar, multi-sequence MR imaging was performed. Intravenous gadolinium-based contrast was not administered. COMPARISON: Right foot radiograph October 21, 2019. FINDINGS: Mild first MTP osteoarthritis with early subchondral bone marrow edema like signal within the first metatarsal head. Mild fragmentation of the fibular sesamoid, better visualize on comparison radiograph. Edema like signal within the fibular sesamoid components is keeping with sesamoiditis. First MTP plantar plate and collateral ligaments are intact. No joint effusion.      Chronic appearing fibular sesamoid fragmentation with superimposed bone marrow edema and sclerosis. This appearance could indicate indicate sequela of osteonecrosis or late sesamoiditis. I have personally reviewed the images and agree with the report (or as edited).        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  Discussion took placed regards to possible etiology (vascular insult, traumatic insult, biomechanic etc) however exact nature of this condition is unknown.  Patient was informed osteonecrosis of subchondral cancellous bone where subchondral bone may collapsed and fragmented.  We proposed following treatments options:   continue current care - wear Hoka shoes with custom orthotic, injection and surgery - removal of  remnant fibular sesamoid.  After the discussion, the patient elected following options: continue current care - wear Hoka shoes with custom orthotic.      We have reviewed previous X-ray and MRI finding and detail discussion took place with the patient.      Return appointment: PRN or symptom worsen    Randa Spike DPM, FACFAS  Reconstructive Ankle and Foot Surgeon  [facility]

## 2019-11-12 ENCOUNTER — Encounter (INDEPENDENT_AMBULATORY_CARE_PROVIDER_SITE_OTHER): Payer: PPO | Admitting: Podiatrist

## 2019-11-19 ENCOUNTER — Telehealth (INDEPENDENT_AMBULATORY_CARE_PROVIDER_SITE_OTHER): Payer: Self-pay | Admitting: Family Medicine

## 2019-11-19 NOTE — Telephone Encounter (Signed)
Pt called and wanted to speak to Dr [name] directly, pt said its confidential and only wanted to discuss with Dr [name]. Please call pt [phone]

## 2019-11-28 ENCOUNTER — Encounter (INDEPENDENT_AMBULATORY_CARE_PROVIDER_SITE_OTHER): Payer: Self-pay | Admitting: Family Medicine

## 2019-11-28 NOTE — Telephone Encounter (Signed)
Routing to provider for review and advisement.

## 2019-12-24 ENCOUNTER — Telehealth (INDEPENDENT_AMBULATORY_CARE_PROVIDER_SITE_OTHER): Payer: Self-pay | Admitting: Family Medicine

## 2019-12-24 NOTE — Telephone Encounter (Signed)
PN sent eCare message for patient to schedule AWV. Last AWV 02/19/2019. Patient 02/20/2020.    PN created scheduling ticket in eCare: yes

## 2019-12-24 NOTE — Telephone Encounter (Signed)
Scheduled the AWV for 02/25/20 at 9:00 am.   Closing TE.

## 2020-01-07 ENCOUNTER — Telehealth (INDEPENDENT_AMBULATORY_CARE_PROVIDER_SITE_OTHER): Payer: Self-pay | Admitting: Family Medicine

## 2020-01-07 NOTE — Telephone Encounter (Signed)
Telemedicine instructions sent.

## 2020-01-12 ENCOUNTER — Telehealth (INDEPENDENT_AMBULATORY_CARE_PROVIDER_SITE_OTHER): Payer: PPO | Admitting: Family Medicine

## 2020-01-12 DIAGNOSIS — G43101 Migraine with aura, not intractable, with status migrainosus: Secondary | ICD-10-CM

## 2020-01-12 MED ORDER — SUMATRIPTAN SUCCINATE 100 MG OR TABS
100.0000 mg | ORAL_TABLET | Freq: Once | ORAL | 0 refills | Status: DC | PRN
Start: 2020-01-12 — End: 2020-04-07

## 2020-01-12 NOTE — Progress Notes (Signed)
ASSESSMENT/PLAN:  (G43.101) Migraine with aura and with status migrainosus, not intractable  (primary encounter diagnosis)  Plan: SUMAtriptan 100 MG tablet       Classic history as below. Exam limited d/t TH link not working.  Will trial rescue medication.  Meds as rx'd. Side effects reviewed. F/u in 2 wks to check status and consider prophylaxis if frequency of need too high     I spent a total of 18 minutes for the patient's care on the date of the service.         Distant Site Telemedicine Encounter    I conducted this encounter from [employer] via telephone, because secure, live, face-to-face video conference is not working, with the patient. Cassie Contreras was located at home.  I reviewed the risks and benefits of telemedicine as pertinent to this visit and the patient agreed to proceed.    END OF VISIT SUMMARY  PLEASE SEE BELOW FOR SUPPORTING DOCUMENTATION AND DETAILS.   -----------------------------------------------------------------------------------------------------------------    SUBJECTIVE:  Cassie Contreras is a 69 year old female presenting today for the following issue(s):    Nearly daily headaches for last month, often wake up w headache, 4-8/10 in intensity.  Flashes of lights in vision when eyes closed.  Left sided.  2 cups of mixed caff/decaff helps somewhat.  Excedrine for migraine helps somewhat.  Light sensitivity and phantom smells along w occ speech slurring.  Stress can be triggers or TV screen, certain smells.  H/o getting aura prior to headaches. BP's have been inconsistently 90-150/70-90's off atenolol.  May have been triggered by husband just getting back from inpatient alcohol rehab.  No h/o triptan rx.    ROS: no fever or cough    Patient Active Problem List    Diagnosis Date Noted    Benign hypertension [I10] 02/20/2019    Anxiety [F41.9] 02/20/2019    Breast lump on right side at 11 o'clock position [N63.11] 02/20/2019    Obstructive sleep apnea [G47.33] 01/11/2018     PSG  01/03/18  Positional, severe OSA (AHI 52 supine, 2.8 lateral); overall mild/mod with AHI 1A 20.6, 1B 9.8, and ODI 12.4      Mixed incontinence [N39.46] 04/20/2017    Pelvic floor weakness [N81.89] 04/20/2017    Dysphonia [R49.0] 04/05/2017    Thyroid nodule [E04.1] 03/22/2017    Atrophic vaginitis [N95.2] 11/15/2015    Hormone replacement therapy [Z79.890] 11/15/2015    Recurrent UTI [N39.0] 11/10/2015     Abx after sex, better w Flomax and Prempro      Osteopenia of spine [M85.88] 11/10/2015    Palpitations [R00.2] 11/10/2015    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  No acute distress.  Speaking in full sentences.     -----------------------------------------------------------------------------------------------------------------  ALSO SEE THE BEGINNING OF THIS NOTE FOR ASSESSMENT AND PLAN    -----------------------------------------------------------------------------------------------------------------

## 2020-01-13 ENCOUNTER — Telehealth (INDEPENDENT_AMBULATORY_CARE_PROVIDER_SITE_OTHER): Payer: Self-pay | Admitting: Family Medicine

## 2020-01-13 NOTE — Telephone Encounter (Signed)
Please assist w telehealth [facility] follow up appt in 2-3 weeks

## 2020-01-13 NOTE — Telephone Encounter (Signed)
Schedule f/u appointment with pt.    Closing TE.

## 2020-01-23 ENCOUNTER — Encounter (INDEPENDENT_AMBULATORY_CARE_PROVIDER_SITE_OTHER): Payer: Self-pay | Admitting: Family Medicine

## 2020-01-24 ENCOUNTER — Encounter (INDEPENDENT_AMBULATORY_CARE_PROVIDER_SITE_OTHER): Payer: Self-pay | Admitting: Family Medicine

## 2020-01-26 NOTE — Telephone Encounter (Signed)
Routing to provider for review and advisement.

## 2020-01-28 ENCOUNTER — Telehealth (INDEPENDENT_AMBULATORY_CARE_PROVIDER_SITE_OTHER): Payer: PPO | Admitting: Family Medicine

## 2020-01-28 ENCOUNTER — Encounter (INDEPENDENT_AMBULATORY_CARE_PROVIDER_SITE_OTHER): Payer: Self-pay | Admitting: Family Medicine

## 2020-01-28 ENCOUNTER — Telehealth (INDEPENDENT_AMBULATORY_CARE_PROVIDER_SITE_OTHER): Payer: Self-pay | Admitting: Family Medicine

## 2020-01-28 DIAGNOSIS — G43109 Migraine with aura, not intractable, without status migrainosus: Secondary | ICD-10-CM

## 2020-01-28 MED ORDER — ONDANSETRON HCL 8 MG OR TABS
ORAL_TABLET | ORAL | 0 refills | Status: DC
Start: 2020-01-28 — End: 2020-04-07

## 2020-01-28 NOTE — Telephone Encounter (Addendum)
Convert in-person to a telemedicine today at 3:00 pm. Patient will start at 2:30 pm.    Telemedicine instructions sent.

## 2020-01-28 NOTE — Progress Notes (Signed)
ASSESSMENT/PLAN:  (G43.109) Migraine with aura and without status migrainosus, not intractable  (primary encounter diagnosis)  Plan: ondansetron 8 MG tablet, Referral to Neurology        Will first try taking ondansetron along w sumatriptan to see if helpful to relieve both pain component and the nausea.  Given some other seemingly neurologic symptoms such as what sounds like right eye nystagmus according to her description will also initiate neurology referral to [facility].  Sending her history questionnaire to help prepare for her evaluation.         Distant Site Telemedicine Encounter    I conducted this encounter from [facility] via secure, live, face-to-face video conference with the patient. Cassie Contreras was located at home.  I reviewed the risks and benefits of telemedicine as pertinent to this visit and the patient agreed to proceed.    END OF VISIT SUMMARY  PLEASE SEE BELOW FOR SUPPORTING DOCUMENTATION AND DETAILS.   -----------------------------------------------------------------------------------------------------------------    SUBJECTIVE:  Cassie Contreras is a 70 year old female presenting today for the following issue(s):    F/u of migraine headache management. 1st sumatriptan dose helped w migraine headache pain but not the assocd nasuea and fatigue and photosensitivity.  Simlar experience second dose w brief finger paresthesias and acid indigestion.  Blood pressure also noted lowered to 89./67 and 99/71 after taking sumatriptan for several hours.  She also noticed her RIGHT eye shaking side to side quickly on-off and taking longer time to focus in her left eye.  Overall sumatriptan helpful to relieve pain but concerned about these lingering symptoms after taking the medication.    ROS: no fever or cough    Patient Active Problem List    Diagnosis Date Noted    Benign hypertension [I10] 02/20/2019    Anxiety [F41.9] 02/20/2019    Breast lump on right side at 11 o'clock position  [N63.11] 02/20/2019    Obstructive sleep apnea [G47.33] 01/11/2018     PSG 01/03/18  Positional, severe OSA (AHI 52 supine, 2.8 lateral); overall mild/mod with AHI 1A 20.6, 1B 9.8, and ODI 12.4      Mixed incontinence [N39.46] 04/20/2017    Pelvic floor weakness [N81.89] 04/20/2017    Dysphonia [R49.0] 04/05/2017    Thyroid nodule [E04.1] 03/22/2017    Atrophic vaginitis [N95.2] 11/15/2015    Hormone replacement therapy [Z79.890] 11/15/2015    Recurrent UTI [N39.0] 11/10/2015     Abx after sex, better w Flomax and Prempro      Osteopenia of spine [M85.88] 11/10/2015    Palpitations [R00.2] 11/10/2015    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  No acute distress.  Speaking in full sentences.   PSYCH: Makes good eye contact, dressed and groomed neatly and casually, tells a logical linear hx with no signs of thought d/o.     -----------------------------------------------------------------------------------------------------------------  ALSO SEE THE BEGINNING OF THIS NOTE FOR ASSESSMENT AND PLAN    -----------------------------------------------------------------------------------------------------------------

## 2020-01-29 ENCOUNTER — Encounter (HOSPITAL_BASED_OUTPATIENT_CLINIC_OR_DEPARTMENT_OTHER): Payer: Self-pay

## 2020-01-29 ENCOUNTER — Telehealth (INDEPENDENT_AMBULATORY_CARE_PROVIDER_SITE_OTHER): Payer: Self-pay | Admitting: Family Medicine

## 2020-01-29 DIAGNOSIS — G43109 Migraine with aura, not intractable, without status migrainosus: Secondary | ICD-10-CM | POA: Insufficient documentation

## 2020-01-29 NOTE — Telephone Encounter (Signed)
See mychart.  

## 2020-02-04 NOTE — Progress Notes (Signed)
NEUROLOGY HEADACHE CLINIC NOTE     CHIEF COMPLAINT   [name] chief concern is uncontrolled headache.  Name of the referring physician Hei, Clovis Cao, MD    Name of primary care provider [place], Clovis Cao, MD    Cassie Contreras has more than 15 days of headache per month.  Cassie Contreras has 25 days of headache per month.  Cassie Contreras's headache started to become a problem 50 days ago.    HISTORY OF PRESENT ILLNESS:  Cassie Contreras is a delightful 70 year old female referred by Dr. Lennie Muckle for evaluation of difficult to treat headache symptoms.  Cassie Contreras's first headache(s) occured when Cassie Contreras was 70 years old.     She had migraine in the past, usually migraine with aura (usually visual changes), aura lasting for about 15 minutes, and after this developed headaches with nausea ( in 3 to 4 occasions she had scrambled words)  Sometimes she can " smell a thing there is not there"    Cassie Contreras has been under stress since [place], her husband finished treatment with alcoholism, and that is when Cassie Contreras developed daily headaches    She started having a NEW type of headache that started waking her in the morning  She tried Excedrin migraine  She met Dr. Larwance Rote  (December 21 st 2021) and he gave her sumatriptan and told her to treat early, following the pattern that was established  She tried sumatriptan and she had hurt burn and she had severe nausea, and the headache went away, however she developed severe side effects with not being able to function  She was headache free for a day, and then the next day the headache came back;   " her right eye was shaky and vibrating"  She told Dr. Tarry Kos that she did not want to take this medication, and he told her that likely all her symptoms were related to the headache,  Not medication  She has severe light sensitivity  She has black out shades, however tiny amount of light escaping can cause severe headache  Dr. Tarry Kos told her to add nausea drug, however Cassie Contreras was very concerned about her symptoms  She took Excedrin,  however it helped her for 2 days, however then it comes back again    Headaches started to become a problem about 50 days ago.    Cassie Contreras is having an average of 25 days of headache per month, including 4 days of severe headache per month. The average pain severity is 4/10. The most severe headaches are rated 8/10. The headaches often last 3 hours, occurring morning.  Cassie Contreras believes the headache symptoms are caused by "unsure." Cassie Contreras's headache is described as: "Nausea, light sensitivity, vibrating eye, balance, closed eye flashing visions".    GOAL(S) OF THE VISIT IN THE NEUROLOGY HEADACHE CLINIC.  Cassie Contreras has the following goals for this visit:  Discuss headache management  Better understand diagnosis (what is causing the headache and other symptoms)  Mita is worried about having a brain tumor    TREATMENT PREFERENCE(S).  Louetta prefers the following treatment options:  Preventive prescription medication  Acute prescription medication  Supplements, herbs, or vitamins  Stress management  Judieth is interested in learning about new medications and other new treatments.    TREATMENT MODALITIES.  Cassie Contreras is willing to participate in the following treatment modalities:  Decrease stress    CONCERNS REGARDING CHANGES IN HEADACHE AND RELATED SYMPTOMS.  Tessah is concerned that the headache have changed in the following qualities:  The headaches have increased in their severity  The headaches are more intense  There are visual changes associated with the headaches  There are other non-headache symptoms such as dizziness and vertigo  vibrating right eye    HEADACHE LOCATION:  Forehead    LATERALITY OF HEADACHE:  Left side    QUALITY OF HEADACHE PAIN  Pressure  Stabbing  Dull    HEADACHE TRIGGERS.  Daniele has the following headache triggers:  Stress  bright lights, strong odors    VISUAL CHANGE(S) BEFORE OR DURING HEADACHE  No aura since November 2021  Migraine with aura  Seeing zigzag lines  Temporary blind spot (scotoma)  Blurred  vision  closed eye flashing visions of random things    ASSOCIATED HEADACHE SYMPTOMS.  Nausea/vomiting  Light sensitivity  Dizziness  Fatigue/ low energy  jumbled speech, closed eye flashing visions    AUTONOMIC SYMPTOMS  No autonomic symptoms during headache    EXACERBATION OF HEADACHES.  Jerriah feels that the headaches are worse with:  Bright light or glare  Strong smells    HEADACHE RELIEF.  Cassie Contreras finds the following to be helpful in relieving headaches:  Medications  Avoiding bright lights, loud sounds, certain smells  coffee    MEDICATIONS TRIED  ACUTE MEDICATIONS  Imitrex (Sumatriptan)    PREVENTIVE MEDICATIONS FOR HEADACHE  Chalet states they are generally sensitive to medications.    PREVENTIVE MEDICATIONS TRIED THAT HAVE BEEN GIVEN ADEQUATE TRIAL FOR HEADACHE  (At least 3 months at a therapeutic dose)  none    THERAPIES TRIED  Cassie Contreras has not tried other therapies for headache prevention.    SUPPLEMENTS  Cassie Contreras has not tried any other supplements for headache    NEUROIMAGING/ED:  Cassie Contreras reports they have never had CT head.  Cassie Contreras reports they have had MRI of the brain on 07-09-2002. Results of the scan per [facility] are: normal.  Cassie Contreras reports they have never sought care in the ER for headache.    SOCIAL HISTORY:  Cassie Contreras has Masters.  Cassie Contreras's overall health is: Excellent.  In the past 3 months, headaches have interfered with Cassie Contreras's normal work (outside and home): A little bit.    Currently retired, no longer working.  Minutes per week of moderate to vigorous exercise: 100  Servings of carbohydrates per day: 3  Servings of vegetables and fruit per day: 2  Cassie Contreras does not smoke.  Cassie Contreras drinks 7 cups of caffeinated beverages per week.  Cassie Contreras states that they drink 1 alcoholic beverages per week.  Cassie Contreras has difficulty sleeping. See attached REDCap survey responses for more detail.    ADDITIONAL DATA:  PHQ-4 score is 2  PSS score is 21  STOP BANG score is 1    REVIEW OF SYMPTOMS, reviewed with Cassie Contreras today and uploaded from [employer].  Positives include:  Headache  Dizziness  Light-headedness  Difficulty speaking  Anxious mood  Trouble sleeping  Photophobia  Otherwise 10 ROS negative.    PAST MEDICAL HISTORY    Anxiety or panic disorder  Sleep Apnea    PAST SURGICAL HISTORY  Surgery on my left foot, [place], 2014    FAMILY MEDICAL HISTORY  Daughter with headache      PHYSICAL EXAMINATION    Jiayi Lengacher is a 70 year old female patient who has the following vital signs with   Vitals:    02/05/20 1154   Weight: 68 kg (150 lb)   Height: 5' 8.11" (1.73 m)   , body  surface area is 1.81 meters squared.    GENERAL: Pleasant, well-appearing in no acute distress.    HEENT: Head normorcephalic, atraumatic.    PULMONARY: Breathing comfortably on room air.    NEURO:    Mental status: Awake, alert, and oriented to person, place, time, and situation. Speech clear, fluent.    Cranial nerves:    III/IV/VI: EOMI. No nystagmus.    VII: Facial expressions full and symmetric.    VIII: Hearing intact to voice.    IX/X: Symmetric elevation of the soft palate.    XII: Tongue protrusion midline.    Motor exam: Moves all four extremities spontaneously. No pronator drift. No orbiting. Normal rapid tapping. No abnormal movements. No tremor.    Coordination: Finger to nose and heel to shin intact bilaterally.    Gait: Normal gait. Able to walk on heels, toes, and in tandem. Romberg negative.     ADDITIONAL DATA:   Reviewed medical notes from outside providers     REVIEW  We reviewed with patient face-to-face  their current medical history, medications, allergies, past medical history, surgical history, social history, family history, and review of systems, and updated all the information. See uploaded [employer]Cap for most thorough past medical, surgical, family history which was reviewed this visit.       ASSESSMENT AND PLAN  Laqueena  is a very pleasant, 70 year old with complicated diagnosis of (R51.9) New onset of headaches after age 59  (primary encounter  diagnosis)  (G43.109) Migraine with aura and without status migrainosus, not intractable   She had migraine with aura in the past, however in November 2021 developed a new new left sided headache located in left temple and left vision loss  Plan: MRI BRAIN COMBO, RBC Sedimentation Rate, CRP, high sensitivity, Folate and Vitamin B12, Thyroid Stimulating Hormone      (G43.109) Migraine with aura and without status migrainosus, not intractable      Vondell has headaches that meet International Headache Society Criteria for   Patient feels not only severe disabling pain, but is also unable to function due to headaches.      Our approach to every patient is multimodal.  I focused our discussion on addressing modifiable risk factors and trying to decrease them.    We discussed and recommend the following treatments:    1. Preventive medications: these medications have to be taken every single day to decrease headache frequency and severity; it takes at least minimum of few weeks to see any results; and have to be taken for at least 1 to 2 years. The medication should be started at a small dose, and increased if no significant side effects.  Patient compliance is key for effectiveness of the preventive medications.     We discussed the options of beta-blockers, calcium channel blockers, SSRIs, SNRIs, neuron modulating options like topiramate.    Preventive medication advised today:   Could consider namenda or duloxetine    2. Acute (abortive) medications: these medications are to be taken only at the onset of severe headache.  Please limit a total of any combination of these medications to less than 6 days per month on average because they can cause rebound (medication-use) headaches.    Abortive medication advised today:   Could consider rizatriptan    3. Supplements and natural approaches to increasing brain energy and serotonin:  SAM-e 200-450m daily  Boswellia 800 mg 2-3 times daily - Boswellia is an anti-inflammatory  herb; consider Swanson's brand  Ginger  44m daily as needed for acute headache, or brew your own tea with fresh ginger    4. Neuromodulation: We recommend a trial of the following medical device:  Cefaly for acute treatment and prevention - External Trigeminal Nerve Stimulation device  Attend the Shared Medical Visit with MDorrene GermanPA-C to learn more about Neuromodulation devices     5. DIET: I recommend anti-inflammatory diet like [address] (decrease sugar and gluten, eat more fruits and vegetables) or 20/20 lifestyles. Consider attending the Nutrition Shared Medical Visit.    6. EXERCISE: Gentle movement exercises, concentrate on combination of muscle building and aerobic activity    7. ANXIETY/STRESS RELIEF: We recommend the following techniques to reduce your stress or anxiety:  "Curable" app for Cognitive Behavioral Therapy for chronic pain    8. FUN: Increase fun time spent with friends and family      Benefits, side effects, and alternatives were discussed.     RDafinaunderstands all the recommendations.     she is going to follow up with NFransico SettersMD within 1 month to make sure that the current plan is being implemented so that she can feel better and start healing.    Here are a few tips to improve your healthcare:    Please fill out your RED CAP survey prior to each visit.     Refills: Call your pharmacy at least 7 working days before you run out. Please do not call the clinic for refills, it's quicker and safer to go through your pharmacy. We usually prescribe the medication for one year. If you continue to receive the prescription from [facility] we require at least one yearly visit for refills.        Please schedule your appointment one month prior to running out.     Test Results: Available in 1-2 weeks on eCare. We will contact you sooner by telephone if there is something more urgent.     Urgent Symptoms: Call [phone] day or night, and select option 8. Our clinic staff will help you  during regular hours; after hours,  of [place] nurses will help you. If life-threatening, please call 911.       Other Questions: Use eCare to securely e-mail [facility] If you have a long or complex question or a new issue, please make an appointment, call [phone] day or night, and select option 8. To sign up for ecare call [phone] ask your medical assistant to sign you up when in clinic.      Headache Clinic Policies:    - You need be seen at least once a year to continue safely receiving prescriptions from [facility]  - We do not coordinate disability or social security, however we can provide our note to your primary care provider to help you coordinate the process.  - We can assist with intermittent family medical leave paperwork (FMLA); filling out the paperwork requires a face-to-face office visit.   - Schedule appointments ahead of time; we do not have urgent, same-day appointments available.  - We do not prescribe opioids or other controlled substances aside from Lyrica.  - To assure best access to care for all of our patients, we require at least 48 hours notice prior to cancelling appointments.      she is agreeable with the plan.       We discussed with patient about the diagnosis; proposed treatment, risks and benefits of treatment; alternative treatments, their risks and benefits; prognosis with and  without treatment, probable course of recovery. The patient is able to understand and repeat given information and is agreeable to the plan and recommendations. The patient had adequate time to ask questions, and had no further questions.       Distant Site Telemedicine Encounter    Telemedicine Consent  You have chosen to receive care through the use of telemedicine. Telemedicine enables health care providers at different locations to provide safe, effective, and  convenient care through the use of technology. As with any health care service, there are risks associated with the use of  telemedicine, including equipment failure, poor image resolution, and information security issues.    Patient understands the risks and benefits of telemedicine as I have explained them. Questions regarding telemedicine have been answered. Patient consents to use of telemedicine in their medical care today.    I conducted this encounter from [facility] via secure, live, face-to-face video conference (Zoom, HIPAA-compliant) with Yuko Coventry who was at their home located in [place].    Prior to the interview, the risks and benefits of telemedicine were discussed with the patient and verbal consent was obtained as noted above.    Patient is joined by: self    All participants were identified and introduced.      Total time I spent preparing for the visit, documenting the visit, and direct face to face (telehealth): TOTAL TIME 60 minutes

## 2020-02-05 ENCOUNTER — Ambulatory Visit: Payer: PPO | Attending: Neurology | Admitting: Neurology

## 2020-02-05 ENCOUNTER — Encounter (INDEPENDENT_AMBULATORY_CARE_PROVIDER_SITE_OTHER): Payer: Self-pay | Admitting: Family Medicine

## 2020-02-05 ENCOUNTER — Encounter (HOSPITAL_BASED_OUTPATIENT_CLINIC_OR_DEPARTMENT_OTHER): Payer: Self-pay | Admitting: Neurology

## 2020-02-05 ENCOUNTER — Other Ambulatory Visit (HOSPITAL_BASED_OUTPATIENT_CLINIC_OR_DEPARTMENT_OTHER): Payer: Self-pay | Admitting: Family Medicine

## 2020-02-05 VITALS — Ht 68.11 in | Wt 150.0 lb

## 2020-02-05 DIAGNOSIS — R519 Headache, unspecified: Secondary | ICD-10-CM

## 2020-02-05 DIAGNOSIS — Z09 Encounter for follow-up examination after completed treatment for conditions other than malignant neoplasm: Secondary | ICD-10-CM

## 2020-02-05 DIAGNOSIS — Z1231 Encounter for screening mammogram for malignant neoplasm of breast: Secondary | ICD-10-CM

## 2020-02-05 DIAGNOSIS — G4452 New daily persistent headache (NDPH): Secondary | ICD-10-CM

## 2020-02-05 DIAGNOSIS — G43109 Migraine with aura, not intractable, without status migrainosus: Secondary | ICD-10-CM

## 2020-02-05 NOTE — Patient Instructions (Signed)
MRI brain to rule out any secondary issues  Blood work     Supplements and natural approaches to increasing brain energy and serotonin:  SAM-e 200-400mg  daily  Boswellia 800 mg 2-3 times daily - Boswellia is an anti-inflammatory herb; consider Swanson's brand  Ginger 400mg  daily as needed for acute headache, or brew your own tea with fresh ginger    Neuromodulation: We recommend a trial of the following medical device:  Cefaly for acute treatment and prevention - External Trigeminal Nerve Stimulation device  Attend the Shared Medical Visit with PA-C to learn more about Neuromodulation devices     DIET: I recommend anti-inflammatory diet     EXERCISE: Gentle movement exercises, concentrate on combination of muscle building and aerobic activity    ANXIETY/STRESS RELIEF: We recommend the following techniques to reduce your stress or anxiety:  "Curable" app for Cognitive Behavioral Therapy for chronic pain

## 2020-02-06 ENCOUNTER — Ambulatory Visit: Payer: PPO | Attending: Neurology

## 2020-02-06 ENCOUNTER — Other Ambulatory Visit (HOSPITAL_BASED_OUTPATIENT_CLINIC_OR_DEPARTMENT_OTHER): Payer: Self-pay | Admitting: Neurology

## 2020-02-06 DIAGNOSIS — R519 Headache, unspecified: Secondary | ICD-10-CM

## 2020-02-06 LAB — SED RATE: Erythrocyte Sedimentation Rate: 13 mm/h (ref 0–20)

## 2020-02-06 LAB — C_REACTIVE PROTEIN: C_Reactive Protein: 1.9 mg/L (ref 0.0–10.0)

## 2020-02-06 LAB — FOLATE & VITAMIN B12
Folate, SRM: 20.4 ng/mL (ref >5.8)
Vitamin B12 (Cobalamin): 501 pg/mL (ref 180–914)

## 2020-02-06 LAB — THYROID STIMULATING HORMONE: Thyroid Stimulating Hormone: 1.529 u[IU]/mL (ref 0.400–5.000)

## 2020-02-14 ENCOUNTER — Other Ambulatory Visit (INDEPENDENT_AMBULATORY_CARE_PROVIDER_SITE_OTHER): Payer: Self-pay | Admitting: Neurology

## 2020-02-14 DIAGNOSIS — M87076 Idiopathic aseptic necrosis of unspecified foot: Secondary | ICD-10-CM

## 2020-02-14 MED ORDER — DIAZEPAM 2 MG OR TABS
2.0000 mg | ORAL_TABLET | ORAL | 0 refills | Status: DC
Start: 2020-02-14 — End: 2020-04-07

## 2020-02-16 ENCOUNTER — Ambulatory Visit
Admission: RE | Admit: 2020-02-16 | Discharge: 2020-02-16 | Disposition: A | Discharge status: Home / Self Care | Payer: PPO | Attending: Neuroradiology | Admitting: Neuroradiology

## 2020-02-16 DIAGNOSIS — R519 Headache, unspecified: Secondary | ICD-10-CM

## 2020-02-16 MED ORDER — GADOTERIDOL 279.3 MG/ML IV SOLN
14.0000 mL | Freq: Once | INTRAVENOUS | Status: AC | PRN
Start: 2020-02-16 — End: 2020-02-16
  Administered 2020-02-16: 7 mmol via INTRAVENOUS

## 2020-02-17 ENCOUNTER — Telehealth (INDEPENDENT_AMBULATORY_CARE_PROVIDER_SITE_OTHER): Payer: Self-pay | Admitting: Neurology

## 2020-02-17 NOTE — Telephone Encounter (Signed)
I called Cassie Contreras with results to MRI brain  1.  No acute intracranial findings. No acute infarct or mass lesion. No abnormal postcontrast enhancement is evident.  2.  Mild background white matter change in nonspecific pattern. Although nonspecific, few of punctate FLAIR/T2 hyperintense foci are subcortical in region.    She is treating the headaches with  SAMe 400 mg and CEFALY    The headaches are getting better    She has no questions or concerns at this time    We will plan to follow-up in few months

## 2020-02-25 ENCOUNTER — Encounter (INDEPENDENT_AMBULATORY_CARE_PROVIDER_SITE_OTHER): Payer: Self-pay | Admitting: Family Medicine

## 2020-02-27 ENCOUNTER — Other Ambulatory Visit (INDEPENDENT_AMBULATORY_CARE_PROVIDER_SITE_OTHER): Payer: Self-pay | Admitting: Family Medicine

## 2020-02-27 DIAGNOSIS — N761 Subacute and chronic vaginitis: Secondary | ICD-10-CM

## 2020-03-02 MED ORDER — VAGIFEM 10 MCG VA TABS
ORAL_TABLET | VAGINAL | 0 refills | Status: DC
Start: 2020-03-02 — End: 2020-05-31

## 2020-03-02 NOTE — Telephone Encounter (Signed)
Patient is due for a routine appointment

## 2020-03-02 NOTE — Telephone Encounter (Signed)
1st attempt-ecare sent

## 2020-03-04 NOTE — Telephone Encounter (Signed)
Patient read my char message.  Closing TE

## 2020-03-05 ENCOUNTER — Other Ambulatory Visit (HOSPITAL_BASED_OUTPATIENT_CLINIC_OR_DEPARTMENT_OTHER): Payer: Self-pay | Admitting: Neurology

## 2020-03-05 DIAGNOSIS — R519 Headache, unspecified: Secondary | ICD-10-CM

## 2020-03-05 MED ORDER — MEMANTINE HCL 10 MG OR TABS
10.0000 mg | ORAL_TABLET | Freq: Every day | ORAL | 11 refills | Status: DC
Start: 2020-03-05 — End: 2021-01-29

## 2020-03-06 ENCOUNTER — Encounter (INDEPENDENT_AMBULATORY_CARE_PROVIDER_SITE_OTHER): Payer: Self-pay | Admitting: Family Medicine

## 2020-03-06 DIAGNOSIS — H3530 Unspecified macular degeneration: Secondary | ICD-10-CM

## 2020-03-08 ENCOUNTER — Telehealth (INDEPENDENT_AMBULATORY_CARE_PROVIDER_SITE_OTHER): Payer: Self-pay | Admitting: Ophthalmology

## 2020-03-08 NOTE — Telephone Encounter (Addendum)
Patient having cataract surgery at [facility] om 03/31/19 and would like to know if she should keep her scheduled appointment with Dr Burnett Harry on 04/22/20 for retina?  Advised to keep appointment with Dr Burnett Harry until notified.  Erik Obey, RN

## 2020-03-08 NOTE — Telephone Encounter (Signed)
Spoke with patient per Dr Burnett Harry to keep scheduled appointment.  Erik Obey, RN

## 2020-03-08 NOTE — Telephone Encounter (Signed)
Routing to Dr Hei to review and advise

## 2020-03-08 NOTE — Telephone Encounter (Signed)
RETURN CALL: Voicemail - Detailed Message      SUBJECT:  General Message     MESSAGE: Patient is having cataract surgery on 03/30/2020 and is wondering if that is going to effect her appointment scheduled for 04/22/2020 with [facility] retina. Patient is concerned about healing after surgery and how that effects her retina exam with dilation.

## 2020-04-07 ENCOUNTER — Encounter (INDEPENDENT_AMBULATORY_CARE_PROVIDER_SITE_OTHER): Payer: Self-pay | Admitting: Family Medicine

## 2020-04-07 ENCOUNTER — Ambulatory Visit (INDEPENDENT_AMBULATORY_CARE_PROVIDER_SITE_OTHER): Payer: PPO | Admitting: Family Medicine

## 2020-04-07 VITALS — BP 162/76 | HR 59 | Temp 97.0°F | Resp 14 | Wt 151.0 lb

## 2020-04-07 DIAGNOSIS — L853 Xerosis cutis: Secondary | ICD-10-CM

## 2020-04-07 DIAGNOSIS — F419 Anxiety disorder, unspecified: Secondary | ICD-10-CM

## 2020-04-07 DIAGNOSIS — M87076 Idiopathic aseptic necrosis of unspecified foot: Secondary | ICD-10-CM

## 2020-04-07 MED ORDER — BIMATOPROST 0.03 % EX SOLN
1.0000 | Freq: Every evening | CUTANEOUS | 0 refills | Status: DC
Start: 2020-04-07 — End: 2022-05-29

## 2020-04-07 MED ORDER — CLONAZEPAM 0.5 MG OR TABS
0.5000 mg | ORAL_TABLET | Freq: Every day | ORAL | 0 refills | Status: DC | PRN
Start: 2020-04-07 — End: 2021-06-23

## 2020-04-07 NOTE — Progress Notes (Signed)
ASSESSMENT/PLAN:  (F41.9) Anxiety  (primary encounter diagnosis)  Plan: clonazePAM 0.5 MG tablet        Doing well. Reviewed CBT techniques and ironic process theory.    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   -----------------------------------------------------------------------------------------------------------------    SUBJECTIVE:  Cassie Contreras is a 70 year old female presenting today for the following issue(s):    F/u of anxiety w rare use of clonazepam at night to facilitate sleep.  Got 2 diazepams from specialist recently to help her get through brain MRI for migrains, which are improved on memantine but still having some bouts of odd smells and nausea but without headaches.  Not taking sumatriptan for rescue.  The previous dx of RIGHT metatarsal bone avascular necrosis is not causing significant disability other than painful and inability to plank or lunge exercise d/t pressure on front of foot.    ROS: no fever or cough    Patient Active Problem List    Diagnosis Date Noted    Avascular necrosis of bone of foot [facility]) [M87.076] 04/07/2020    New onset of headaches after age 56 [R51.9] 02/05/2020    Migraine with aura and without status migrainosus, not intractable [G43.109] 01/29/2020    Benign hypertension [I10] 02/20/2019    Anxiety [F41.9] 02/20/2019    Breast lump on right side at 11 o'clock position [N63.11] 02/20/2019    Obstructive sleep apnea [G47.33] 01/11/2018     PSG 01/03/18  Positional, severe OSA (AHI 52 supine, 2.8  lateral); overall mild/mod with AHI 1A 20.6, 1B 9.8, and ODI 12.4      Mixed incontinence [N39.46] 04/20/2017    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  No acute distress.  Blood pressure (!) 162/76, pulse (!) 59, temperature 36.1 C, temperature source Temporal, resp. rate 14, weight 68.5 kg (151 lb), SpO2 100 %.   PSYCH: Makes good eye contact, dressed and groomed neatly and casually, tells a logical linear hx with no signs of thought d/o.  SKIN: punctate superficial subQ nodule of RIGHT anterior axilla without overlying erythema, edema or echymosis     1. Little interest or pleasure in doing things: 0    2. Feeling down, depressed or hopeless: 0    3. Trouble  falling or staying asleep, or sleeping too much: (!) 2    4. Feeling tired or having little energy : 1    5. Poor appetite or overeating: (!) 2    6. Feeling bad about yourself - or that you are a failure or have let yourself or your family down: 0    7. Trouble concentrating on things, such as reading the newspaper or watching television: 0    8. Moving or speaking much more slowly than usual.  Or the opposite - fidgety or restless: 0    9. Thoughts that you would be better off dead or of hurting yourself in some way: 0    PHQ9 Total Score: 5    10. If you checked off any problems, how difficult have these problems made it for you to do your work, take care of things at home, or get along with other  people?: Not difficult at all   Feeling nervous, anxious, or on edge: 2    Not being able to stop or control worrying: 2    Worrying too much about different things: 1    Trouble relaxing: 0    Being so restless that it is hard to sit still: 0    No data recorded  Feeling afraid, as if something awful might happen: 1    No data recorded  If you checked any problems, how difficult have they made it for you to do your work, take care of things at home, or get along with other people?: Not difficult at all         -----------------------------------------------------------------------------------------------------------------  ALSO SEE THE BEGINNING OF THIS NOTE FOR ASSESSMENT AND PLAN  -----------------------------------------------------------------------------------------------------------------

## 2020-04-11 ENCOUNTER — Encounter (INDEPENDENT_AMBULATORY_CARE_PROVIDER_SITE_OTHER): Payer: Self-pay | Admitting: Family Medicine

## 2020-04-12 NOTE — Telephone Encounter (Signed)
LVM for pt tcb to schedule a pre op appt for surgery in June 13th. CCR please help schedule when pt calls back. Thank you.

## 2020-04-13 NOTE — Telephone Encounter (Signed)
Pt is scheduled °

## 2020-04-14 ENCOUNTER — Ambulatory Visit (HOSPITAL_BASED_OUTPATIENT_CLINIC_OR_DEPARTMENT_OTHER)
Admit: 2020-04-14 | Discharge: 2020-04-14 | Disposition: A | Discharge status: Home / Self Care | Payer: PPO | Source: Home / Self Care

## 2020-04-14 ENCOUNTER — Encounter (HOSPITAL_BASED_OUTPATIENT_CLINIC_OR_DEPARTMENT_OTHER): Payer: Self-pay

## 2020-04-14 ENCOUNTER — Ambulatory Visit
Admission: RE | Admit: 2020-04-14 | Discharge: 2020-04-14 | Disposition: A | Discharge status: Home / Self Care | Payer: PPO | Attending: Diagnostic Radiology | Admitting: Diagnostic Radiology

## 2020-04-14 DIAGNOSIS — Z09 Encounter for follow-up examination after completed treatment for conditions other than malignant neoplasm: Secondary | ICD-10-CM

## 2020-04-14 DIAGNOSIS — Z1231 Encounter for screening mammogram for malignant neoplasm of breast: Secondary | ICD-10-CM | POA: Insufficient documentation

## 2020-04-15 NOTE — Progress Notes (Deleted)
70 year old female here for new retina evaluation.     No past ocular history of laser or surgery.  No past ocular history of trauma.  No family history of retinal detachment.    A/P:  1. Diabetes:  - OCT was done to evaluate the architecture of the retina. 3/31/2022OCT shows:                             Right GNF:AOZHYQM macular thickness was ***microns. Foveal contour was *** blunted, There is *** intraretinal fluid/subretinal fluid.                             Left eye: central macular thickness was ***microns. Foveal contour was *** blunted, There is *** intraretinal fluid/subretinal fluid.    - Right eye exam and OCT shows:  - Left eye exam and OCT shows:    - Will recommend tight BS and BP control      2. Cataract OU:  - NVS  - will monitor     RTC

## 2020-04-21 ENCOUNTER — Encounter (INDEPENDENT_AMBULATORY_CARE_PROVIDER_SITE_OTHER): Payer: Self-pay | Admitting: Ophthalmology

## 2020-04-21 ENCOUNTER — Telehealth (INDEPENDENT_AMBULATORY_CARE_PROVIDER_SITE_OTHER): Payer: Self-pay | Admitting: Ophthalmology

## 2020-04-21 ENCOUNTER — Telehealth (HOSPITAL_BASED_OUTPATIENT_CLINIC_OR_DEPARTMENT_OTHER): Payer: Self-pay | Admitting: Neurology

## 2020-04-21 NOTE — Telephone Encounter (Signed)
I called the patient and apologized that she was told the next available appointment was in May.  I offered her an appointment on 4/7 at 8:45 per my conversation with Dr. Burnett Harry.

## 2020-04-21 NOTE — Telephone Encounter (Signed)
Spoke to patient to advise that Dr. Burnett Harry will be out of office on 3/31due to an emergency and her appointment has to be rescheduled. Patient was offered the soonest new patient appointment on 5/23 with Dr. Threasa Beards. Patient refused the appointment, stating that she was insulted by the offer and that the clinic was too busy or important to assist in getting her a sooner appointment. Patient was advised she is being offered the soonest new patient appointment with any retina provider . Offered to have the clinical staff call her to discuss her symptoms to determine if she must be seen sooner than 5/23. Patient stated she did not want anyone to call her to discuss her symptoms. Patient states she will call her PCP to determine what she should do.

## 2020-04-21 NOTE — Telephone Encounter (Signed)
Patients spouse called in to clinic stating that before we push patient out to May we should take into consideration the 1.4 million dollars that him and his wife just donated to the [facility] and treat them with a bit more respect. He stated he wanted to talk to my manager because we cannot get the patient in sooner than May.     Routing to Melanee Spry L to advise as the patients spouse would like to talk to the supervisor.

## 2020-04-22 ENCOUNTER — Encounter (INDEPENDENT_AMBULATORY_CARE_PROVIDER_SITE_OTHER): Payer: PPO | Admitting: Ophthalmology

## 2020-04-28 ENCOUNTER — Telehealth (INDEPENDENT_AMBULATORY_CARE_PROVIDER_SITE_OTHER): Payer: Self-pay | Admitting: Family Medicine

## 2020-04-28 NOTE — Telephone Encounter (Signed)
Telemedicine instructions sent.

## 2020-04-29 ENCOUNTER — Ambulatory Visit (HOSPITAL_BASED_OUTPATIENT_CLINIC_OR_DEPARTMENT_OTHER): Payer: PPO

## 2020-04-29 ENCOUNTER — Encounter (INDEPENDENT_AMBULATORY_CARE_PROVIDER_SITE_OTHER): Payer: Self-pay | Admitting: Ophthalmology

## 2020-05-03 ENCOUNTER — Encounter (HOSPITAL_BASED_OUTPATIENT_CLINIC_OR_DEPARTMENT_OTHER): Payer: Self-pay | Admitting: Neurology

## 2020-05-04 ENCOUNTER — Ambulatory Visit: Payer: PPO | Attending: Neurology | Admitting: Neurology

## 2020-05-04 DIAGNOSIS — G43109 Migraine with aura, not intractable, without status migrainosus: Secondary | ICD-10-CM

## 2020-05-04 NOTE — Progress Notes (Signed)
[facility] SHARED MEDICAL FOLLOW-UP VISIT    IDENTIFICATION DATA/CHIEF COMPLAINT:  Cassie Contreras is a very pleasant 70 year old with diagnosis of (G43.109) Migraine with aura and without status migrainosus, not intractable  (primary encounter diagnosis). Here today for headache management shared medical follow up visit.     HPI:  Cassie Contreras is a delightful 70 year old female referred by Dr. Lennie Contreras for evaluation of difficult to treat headache symptoms.  Cassie Contreras's first headache(s) occured when Cassie Contreras was 70 years old.     She had migraine in the past, usually migraine with aura (usually visual changes), aura lasting for about 15 minutes, and after this developed headaches with nausea ( in 3 to 4 occasions she had scrambled words)  Sometimes she can " smell a thing there is not there"    Cassie Contreras has been under stress since Cassie Contreras, her husband finished treatment with alcoholism, and that is when Cassie Contreras developed daily headaches    She started having a NEW type of headache that started waking her in the morning  She tried Excedrin migraine  She met Cassie Contreras  (December 21 st 2021) and he gave her sumatriptan and told her to treat early, following the pattern that was established  She tried sumatriptan and she had hurt burn and she had severe nausea, and the headache went away, however she developed severe side effects with not being able to function  She was headache free for a day, and then the next day the headache came back;   " her right eye was shaky and vibrating"  She told Cassie Contreras that she did not want to take this medication, and he told her that likely all her symptoms were related to the headache,  Not medication  She has severe light sensitivity  She has black out shades, however tiny amount of light escaping can cause severe headache  Cassie Contreras told her to add nausea drug, however Cassie Contreras was very concerned about her symptoms  She took Excedrin, however it helped her for 2 days, however then it comes back  again    Headaches started to become a problem about 50 days ago.    Cassie Contreras is having an average of 25 days of headache per month, including 4 days of severe headache per month. The average pain severity is 4/10. The most severe headaches are rated 8/10. The headaches often last 3 hours, occurring morning.  Cassie Contreras believes the headache symptoms are caused by "unsure." Cassie Contreras's headache is described as: "Nausea, light sensitivity, vibrating eye, balance, closed eye flashing visions".    GOAL(S) OF THE VISIT IN THE NEUROLOGY HEADACHE CLINIC.  Cassie Contreras has the following goals for this visit:  Discuss headache management  Better understand diagnosis (what is causing the headache and other symptoms)  Cassie Contreras is worried about having a brain tumor    TREATMENT PREFERENCE(S).  Cassie Contreras prefers the following treatment options:  Preventive prescription medication  Acute prescription medication  Supplements, herbs, or vitamins  Stress management  Cassie Contreras is interested in learning about new medications and other new treatments.    TREATMENT MODALITIES.  Cassie Contreras is willing to participate in the following treatment modalities:  Decrease stress    CONCERNS REGARDING CHANGES IN HEADACHE AND RELATED SYMPTOMS.  Cassie Contreras is concerned that the headache have changed in the following qualities:  The headaches have increased in their severity  The headaches are more intense  There are visual changes associated with the headaches  There are other  non-headache symptoms such as dizziness and vertigo  vibrating right eye    HEADACHE LOCATION:  Forehead    LATERALITY OF HEADACHE:  Left side    QUALITY OF HEADACHE PAIN  Pressure  Stabbing  Dull    HEADACHE TRIGGERS.  Cassie Contreras has the following headache triggers:  Stress  bright lights, strong odors    VISUAL CHANGE(S) BEFORE OR DURING HEADACHE  No aura since November 2021  Migraine with aura  Seeing zigzag lines  Temporary blind spot (scotoma)  Blurred vision  closed eye flashing visions of random  things    ASSOCIATED HEADACHE SYMPTOMS.  Nausea/vomiting  Light sensitivity  Dizziness  Fatigue/ low energy  jumbled speech, closed eye flashing visions    AUTONOMIC SYMPTOMS  No autonomic symptoms during headache    EXACERBATION OF HEADACHES.  Cassie Contreras feels that the headaches are worse with:  Bright light or glare  Strong smells    HEADACHE RELIEF.  Cassie Contreras finds the following to be helpful in relieving headaches:  Medications  Avoiding bright lights, loud sounds, certain smells  coffee    MEDICATIONS TRIED  ACUTE MEDICATIONS  Imitrex (Sumatriptan)    PREVENTIVE MEDICATIONS FOR HEADACHE  Cassie Contreras states they are generally sensitive to medications.    PREVENTIVE MEDICATIONS TRIED THAT HAVE BEEN GIVEN ADEQUATE TRIAL FOR HEADACHE  (At least 3 months at a therapeutic dose)  none    THERAPIES TRIED  Cassie Contreras has not tried other therapies for headache prevention.    SUPPLEMENTS  Cassie Contreras has not tried any other supplements for headache    NEUROIMAGING/ED:  Cassie Contreras reports they have never had CT head.  Cassie Contreras reports they have had MRI of the brain on 07-09-2002. Results of the scan per Cassie Contreras are: normal.  Cassie Contreras reports they have never sought care in the ER for headache.    SOCIAL HISTORY:  Cassie Contreras has Masters.  Cassie Contreras's overall health is: Excellent.  In the past 3 months, headaches have interfered with Cassie Contreras's normal work (outside and home): A little bit.    Currently retired, no longer working.  Minutes per week of moderate to vigorous exercise: 100  Servings of carbohydrates per day: 3  Servings of vegetables and fruit per day: 2  Cassie Contreras does not smoke.  Cassie Contreras drinks 7 cups of caffeinated beverages per week.  Cassie Contreras states that they drink 1 alcoholic beverages per week.  Cassie Contreras has difficulty sleeping. See attached [employer] survey responses for more detail.    ADDITIONAL DATA:  PHQ-4 score is 2  PSS score is 21  STOP BANG score is 1    REVIEW OF SYMPTOMS, reviewed with Cassie Contreras today and uploaded from [employer].  Positives  include:  Headache  Dizziness  Light-headedness  Difficulty speaking  Anxious mood  Trouble sleeping  Photophobia  Otherwise 10 ROS negative.    PAST MEDICAL HISTORY    Anxiety or panic disorder  Sleep Apnea    PAST SURGICAL HISTORY  Surgery on my left foot, [place], 2014    FAMILY MEDICAL HISTORY  Daughter with headache        Review of Systems -   See [employer] survey that has been uploaded and attached to this EPIC chart note, Reviewed today    Outpatient Medications Prior to Visit   Medication Sig Dispense Refill    bimatoprost (Latisse) 0.03 % topical solution Apply 1 application topically at bedtime. Apply upper eyelid at base of eyelashes with applicator. Blot excess solution. 3 mL 0    budesonide 180 MCG/ACT  inhaler Inhale 1 puff by mouth.      clonazePAM 0.5 MG tablet Take 1 tablet (0.5 mg) by mouth daily as needed (anxiety). 20 tablet 0    fluocinolone-hydroquinone-tretinoin 0.01-4-0.05 % cream Apply topically.      memantine 10 MG tablet Take 1 tablet (10 mg) by mouth daily. 30 tablet 11    omeprazole 20 MG DR capsule Take 1 capsule (20 mg) by mouth daily on an empty stomach. 90 capsule 3    traMADol 50 MG tablet Take 1 tablet (50 mg) by mouth every 4 hours as needed for moderate pain or severe pain. 30 tablet 0    tretinoin 0.05 % cream APPLY TO AFFECTED AREA(S) ON FACE AT BEDTIME 20 g 0    Vagifem 10 MCG vaginal tablet INSERT ONE TABLET VAGINALLY THREE TIMES A WEEK 36 tablet 0    valACYclovir 500 MG tablet Take 1 tablet (500 mg) by mouth daily. 30 tablet 1     No facility-administered medications prior to visit.       Allergies, Past Medical History, Problem List, Family history and Social history reviewed per EPIC.     Assessment:  Myrical Andujo is a very pleasant 70 year old with diagnosis of (G43.109) Migraine with aura and without status migrainosus, not intractable  (primary encounter diagnosis)    Shruti Arrey has the following question discussed during this visit:  Marka is doing  much better now  She has now headaches every 2 weeks  The question is how long to continue medications  We plan to continue Namenda for at least 6 months  We discussed other non-medication approaches    Plan:   Doxie Augenstein participated today in a shared medical visit during which we had a group discussion, utilized power point presentation, handout and instructions. Content included but was not limited to pharmacotherapy including:    1.Preventive medications; these medications have to be taken every single day to decrease headache frequency and severity; it takes at least minimum of few weeks to see any results; and have to be taken for at least 1 to 2 years. The medication should be started at a small dose, and increased if no significant side effects.Patient compliance is key for effectiveness of the preventive medications.(we discussed the options of beta-blockers, calcium channel blockers, SSRIs, SNRIs, neuron modulating like topiramate options)    2. Acute (abortive) medications- these medications are to be taken only at the onset of severe headache and they can't be used more than 6 days per month on average because they can cause rebound (medication-use) headaches.    3. Acute adjuvant medications  Nausea medications  Muscle relaxants    We also discussed other modalities important for healing including:     1. Antiinflammatory diet: Decreasing sugar, increasing fruits and bright green vegetables.  Lean meats, fish or nuts for protein- 3oz three times a week. Example: [address]. Consider the Nutrition Shared Visit   2. Gentle exercise: Pace yourself, daily exercise is important so try to find the minimal amount of exercise you can participate in daily without draining your system and slowly work up from there.   Exercises found to be helpful for headache and fatigue: Water exercise, pilates, yoga, TRX, physical therapy    3. Relaxation techniques: Deep breathing, meditation, hypnotherapy, EFT,  Biofeedback therapy, or massage.  Consider Biofeedback Shared Visit at [facility].    We recommend this multimodal approach for headache management. Your participation in your  care is immensely important and we are excited about the work you have done so far to improve your overall health and headache symptoms.       "May the world be kind to you,  and may your own thoughts be gentle upon yourself."  Alphonzo Lemmings Huie      Follow Up  As directed by your medical team or sooner if any new problems related to headache management arise    Distant Site Telemedicine Encounter  I conducted this encounter from [facility] via secure, live, face-to-face video conference (Zoom, HIPAA-compliant) with Ranell Patrick who was at their home.  This encounter is being conducted via telemedicine in order to comply with restrictions on travel due to the COVID-19 pandemic, and to prevent the patient from potential exposure to the novel corona virus. Prior to the interview, the risks and benefits of telemedicine were discussed with the patient and verbal consent was obtained.  All participants were identified and introduced.    Total time I spent preparing for the visit, documenting the visit, and direct face to face (telehealth) time.   TOTAL TIME 30 minutes

## 2020-05-05 ENCOUNTER — Encounter (INDEPENDENT_AMBULATORY_CARE_PROVIDER_SITE_OTHER): Payer: Self-pay | Admitting: Family Medicine

## 2020-05-05 ENCOUNTER — Telehealth (INDEPENDENT_AMBULATORY_CARE_PROVIDER_SITE_OTHER): Payer: PPO | Admitting: Family Medicine

## 2020-05-05 DIAGNOSIS — Z0000 Encounter for general adult medical examination without abnormal findings: Secondary | ICD-10-CM

## 2020-05-05 DIAGNOSIS — R059 Cough, unspecified: Secondary | ICD-10-CM

## 2020-05-05 MED ORDER — BENZONATATE 200 MG OR CAPS
200.0000 mg | ORAL_CAPSULE | Freq: Three times a day (TID) | ORAL | 0 refills | Status: DC
Start: 2020-05-05 — End: 2020-12-08

## 2020-05-05 NOTE — Progress Notes (Signed)
Distant Site Telemedicine Encounter    I conducted this encounter from [facility] Primary Care via secure, live, face-to-face video conference with the patient. Cassie Contreras was located at Home.  I reviewed the risks and benefits of telemedicine as pertinent to this visit and the patient agreed to proceed.      ANNUAL WELLNESS VISIT    Cassie Contreras is a 70 year old female who presents for an Annual Wellness Visit.   []  Initial   [x]  Subsequent        INFORMATION GATHERING:   The following areas were confirmed with patient/caregiver and/or updated in Epic at this visit:       Past Medical History   Past Surgical History   Family History   Social History    Current medications and supplements (including vitamins and calcium)   Allergies  [x]  All of the above components have been reviewed and updated.     Opioid Use:  [x]  Reviewed    The information below is up to date at the end of this visit:   Review of functional ability, hearing, fall risk and safety, diet, physical activity, and health habits  (via HRA or direct review with patient or caregiver)     The Health Risk Assessment (HRA) for today's visit was completed:    [x]  as Patient-Entered Questionnaire via eCare     []  as paper HRA form, completed by or with the patient or caregiver     []  in HRA template documentation, completed by staff or provider with patient or caregiver at visit      List of current providers and suppliers:     [x]   See Care Team Section   []   See EHR Encounters for [facility] Providers involved in care   []   Other providers and suppliers outside [facility]:     Depression screening: PHQ-2: 0  PHQ-9 (if done):       EXAM:  There were no vitals taken for this visit.     COGNITION:    [x]  Intact     []  Abnormal (describe):     As assessed by:      []  Direct Observation     [x]  Brief Cognitive Screen       ADVANCE CARE PLANNING (ACP)     Advance care planning:   []  Patient Accepted   [x]  Patient Declined     Check all that apply:   []   Advance directives are on file in his chart   []  Explained & discussed advance directives at this visit   []  Completed advance care planning form(s) at this visit   []  Other Advance Care Planning discussion at this visit (describe):       Time Spent on ACP: [x]  None       []  1-15 minutes      []  16-30 minutes      []  >30 minutes      ASSESSMENT:  Cassie Contreras was seen for his Annual Wellness Visit, including identification of risk factors & conditions that may affect his health and function in the future.        Actions at this visit:       Establishing or updating a written schedule of screening and prevention measures recommended and appropriate for for the next 5-10 years   Establishing or updating a list of his risk factors and conditions for which lifestyle or medical interventions are recommended or underway, including  mental health risks and conditions, and including risks/benefits of treatment   Furnishing personalized health advice and, as appropriate, referrals to health education or preventive counseling services or programs (such as fall prevention, tobacco cessation, physical activity, nutrition, weight loss)    [x]  All of the above components have been reviewed and updated.     COUNSELING:      Based on todays evaluation, I recommend the following ways to improve your health or functioning.    You have the following risk factors and/or medical conditions for which there are recommended ways (included in this list) to help you stay as healthy as possible:  1. Stay active  2. Cough medication   3. Check your blood pressure 6-7 times a week idealy different times of the day.  Be sure to sit down and quietly wait for 5 minutes before you begin checking.  Track and calculate the averages every 5-6 readings. Ideally average blood pressure should be <135/<85.  If your blood pressure is averaging higher, then please let me know.     Here are screening & prevention measures  recommended for you:  Health Maintenance   Topic Date Due    Zoster Vaccine (2 of 3) 10/04/2019    Medicare Annual Wellness Visit  02/19/2020    Depression Screening (PHQ-2)  05/04/2021    DTaP, Tdap, and Td Vaccines (2 - Td or Tdap) 03/20/2022    Breast Cancer Screening  04/15/2022    Lipid Disorders Screening  02/19/2024    Colorectal Cancer Screening  02/23/2025    Osteoporosis Screening  Completed    Pneumococcal Vaccine: 65+ years  Completed    Influenza Vaccine  Completed    Hepatitis C Screening  Completed    COVID-19 Vaccine  Completed    Hepatitis A Vaccine  Aged Out    Hepatitis B Vaccine  Aged Out       Please plan to have a Subsequent Annual Wellness Visit in 1 year.     Answers for HPI/ROS submitted by the patient on 05/04/2020  [facility] Score: 0

## 2020-05-05 NOTE — Patient Instructions (Signed)
Check your blood pressure 6-7 times a week idealy different times of the day.  Be sure to sit down and quietly wait for 5 minutes before you begin checking.  Track and calculate the averages every 5-6 readings. Ideally average blood pressure should be <135/<85.  If your blood pressure is averaging higher, then please let me know.

## 2020-05-15 ENCOUNTER — Ambulatory Visit (HOSPITAL_BASED_OUTPATIENT_CLINIC_OR_DEPARTMENT_OTHER): Payer: PPO

## 2020-05-15 DIAGNOSIS — Z23 Encounter for immunization: Secondary | ICD-10-CM

## 2020-05-28 ENCOUNTER — Other Ambulatory Visit (INDEPENDENT_AMBULATORY_CARE_PROVIDER_SITE_OTHER): Payer: Self-pay | Admitting: Family Medicine

## 2020-05-28 DIAGNOSIS — N761 Subacute and chronic vaginitis: Secondary | ICD-10-CM

## 2020-05-28 DIAGNOSIS — L709 Acne, unspecified: Secondary | ICD-10-CM

## 2020-05-29 ENCOUNTER — Encounter (INDEPENDENT_AMBULATORY_CARE_PROVIDER_SITE_OTHER): Payer: Self-pay | Admitting: Family Medicine

## 2020-05-30 NOTE — Progress Notes (Signed)
70 year old female here for new retina evaluation. She was diagnosed with age related macular degeneration in April 2022 by her comprehensive ophthalmologist (Dr. Willaim Bane at [facility]). No family history of ARMD. Prior ocular history notable for dry eyes and bilateral cataract surgery (March 2022), with good results. No metamorphopsia on Amsler monitoring. She has been taking AREDS 2. No medical history.    No past ocular history of trauma.  No family history of retinal detachment.      A/P:  1. Non exudative ARMD:  - OCT was done to evaluate the architecture of the retina. 5/9/2022OCT shows:                             Right ZOX:WRUEAVW macular thickness was 270 microns. Foveal contour was not blunted, There is no intraretinal fluid/subretinal fluid.                             Left eye: central macular thickness was 271 microns. Foveal contour was not blunted, There is no intraretinal fluid/subretinal fluid.  - Right eye exam and OCT shows: Early dry ARMD  - Left eye exam and OCT shows: Early dry ARMD  - Discussed the options of AREDS II  - No smoking  - Heart healthy diet  - Cardiovascular optimization with PCP  - Will monitor the vision at home with amsler grid  - Wet AMD conversion precautions reviewed  - Will recommend tight BS and BP control    2. Pseudophakia OU:  - NVS  - will monitor     RTC 9 months

## 2020-05-31 ENCOUNTER — Ambulatory Visit (INDEPENDENT_AMBULATORY_CARE_PROVIDER_SITE_OTHER): Payer: PPO | Admitting: Ophthalmology

## 2020-05-31 DIAGNOSIS — Z961 Presence of intraocular lens: Secondary | ICD-10-CM

## 2020-05-31 DIAGNOSIS — H3530 Unspecified macular degeneration: Secondary | ICD-10-CM

## 2020-05-31 MED ORDER — TRETINOIN 0.05 % EX CREA
TOPICAL_CREAM | CUTANEOUS | 1 refills | Status: DC
Start: 2020-05-31 — End: 2022-05-15

## 2020-05-31 MED ORDER — VAGIFEM 10 MCG VA TABS
ORAL_TABLET | VAGINAL | 1 refills | Status: DC
Start: 2020-05-31 — End: 2020-11-19

## 2020-05-31 NOTE — Progress Notes (Signed)
--   [facility], macula, Both eyes, images stored in [facility]

## 2020-05-31 NOTE — Telephone Encounter (Signed)
This request requires your approval:    Tretinoin cream-last prescribed on 11/06/19 for acne, restart/continue? Defer to provider.    If this medication is denied please have your staff informed the patient and next visit on 06/23/20.

## 2020-06-10 ENCOUNTER — Encounter (INDEPENDENT_AMBULATORY_CARE_PROVIDER_SITE_OTHER): Payer: Self-pay | Admitting: Podiatrist

## 2020-06-10 ENCOUNTER — Encounter (INDEPENDENT_AMBULATORY_CARE_PROVIDER_SITE_OTHER): Payer: Self-pay | Admitting: Family Medicine

## 2020-06-11 ENCOUNTER — Telehealth (INDEPENDENT_AMBULATORY_CARE_PROVIDER_SITE_OTHER): Payer: Self-pay | Admitting: Family Medicine

## 2020-06-11 DIAGNOSIS — S99912A Unspecified injury of left ankle, initial encounter: Secondary | ICD-10-CM

## 2020-06-11 NOTE — Telephone Encounter (Signed)
Routing to nurse triage and provider

## 2020-06-11 NOTE — Telephone Encounter (Signed)
Copied from my chart encounter dated 5/19 at 6:21 PM  Hi Dr. [name],  I rolled my left ankle early this am and can not put weigh on it. This is the foot I had surgery on in 2014. Would you order an x ray for me as it feels like I might have fractured a bone. (I have e mailed dr Selena Batten also but have had no reply yet)   Thank you,  Cassie Contreras     Patient states that she has already talked to Dr. [name] and he has already ordered an a x-ray and if symptoms worsen she will get her ankle x-ray. Nothing further needed at this time. Closing TE

## 2020-06-11 NOTE — Telephone Encounter (Signed)
Dr. [name] has already called patient this morning regarding her ankle injury.

## 2020-06-11 NOTE — Telephone Encounter (Signed)
Email from patient's husband last night:  Hi Dr [name],  Need a favor!  Cassie Contreras twisted her ankle and believes she broke a bone.    Would you be kind enough to submit an order for an X-ray at [facility].  Wish to take her in tomorrow AM.    Cassie Contreras      S/w patient and actually LEFT ankle is improved noticeably today after rolling at home.  Xray placed to be used PRN. Reviewed s/s of worsening.

## 2020-06-14 ENCOUNTER — Ambulatory Visit
Admission: RE | Admit: 2020-06-14 | Discharge: 2020-06-14 | Disposition: A | Discharge status: Home / Self Care | Payer: PPO | Attending: Diagnostic Radiology | Admitting: Diagnostic Radiology

## 2020-06-14 DIAGNOSIS — X58XXXA Exposure to other specified factors, initial encounter: Secondary | ICD-10-CM | POA: Insufficient documentation

## 2020-06-14 DIAGNOSIS — S99912A Unspecified injury of left ankle, initial encounter: Secondary | ICD-10-CM | POA: Insufficient documentation

## 2020-06-14 NOTE — Telephone Encounter (Signed)
Talked to pt, she is going thru primary care, waiting for xrays to be read, will see what is next and call us if need be

## 2020-06-18 ENCOUNTER — Encounter (INDEPENDENT_AMBULATORY_CARE_PROVIDER_SITE_OTHER): Payer: Self-pay | Admitting: Family Medicine

## 2020-06-23 ENCOUNTER — Encounter (INDEPENDENT_AMBULATORY_CARE_PROVIDER_SITE_OTHER): Payer: Self-pay | Admitting: Family Medicine

## 2020-07-07 ENCOUNTER — Other Ambulatory Visit (INDEPENDENT_AMBULATORY_CARE_PROVIDER_SITE_OTHER): Payer: Self-pay | Admitting: Family Medicine

## 2020-07-07 DIAGNOSIS — K219 Gastro-esophageal reflux disease without esophagitis: Secondary | ICD-10-CM

## 2020-07-08 MED ORDER — OMEPRAZOLE 20 MG OR CPDR
20.0000 mg | DELAYED_RELEASE_CAPSULE | Freq: Every day | ORAL | 1 refills | Status: DC
Start: 2020-07-08 — End: 2021-01-19

## 2020-10-04 ENCOUNTER — Encounter (INDEPENDENT_AMBULATORY_CARE_PROVIDER_SITE_OTHER): Payer: Self-pay

## 2020-10-13 ENCOUNTER — Other Ambulatory Visit (INDEPENDENT_AMBULATORY_CARE_PROVIDER_SITE_OTHER): Payer: PPO | Admitting: Family Medicine

## 2020-10-13 DIAGNOSIS — Z23 Encounter for immunization: Secondary | ICD-10-CM

## 2020-10-13 MED ORDER — COVID-19MRNA BIVAL VACC PFIZER 30 MCG/0.3ML IM SUSP
30.0000 ug | Freq: Once | INTRAMUSCULAR | Status: AC
Start: 2020-10-13 — End: 2020-10-13
  Administered 2020-10-13: 30 ug via INTRAMUSCULAR

## 2020-10-13 NOTE — Progress Notes (Signed)
COVID-19 Vaccine Intake Documentation      Pre-Vaccination Screening Questions:         1.  Are you feeling sick today?       NO       2. Have you ever received a dose of COVID-19 vaccine?     YES         If yes, which vaccine product?   Pfizer         3.  Have you  ever had a severe allergic reaction    (e.g., anaphylaxis) to something?  For example, a reaction for    which you were treated with epinephrine or Epi Pen®  or    for which you had to go to the hospital?    • If yes, please review questions below:       NO       • Was the severe allergic reaction after receiving a COVID-19 vaccine?      NO     • Was the severe allergic reaction after receiving another vaccine or another injectable medication?     NO      4.  Does the patient weigh 66 pounds or less?   No   5. For pediatric patients, has your child ever been diagnosed with MIS-C (multisystem inflammatory disease) related to Covid-19?   N/A      • If so, have they recovered completely AND has it been more than 90 days since their diagnosis? N/A   6. Does the patient attest they meet CDC eligibility criteria for this dose?   Yes

## 2020-10-20 ENCOUNTER — Ambulatory Visit (INDEPENDENT_AMBULATORY_CARE_PROVIDER_SITE_OTHER): Payer: PPO

## 2020-10-20 MED ORDER — INFLUENZA VAC A&B SA ADJ QUAD 0.5 ML IM PRSY
0.5000 mL | PREFILLED_SYRINGE | Freq: Once | INTRAMUSCULAR | Status: AC
Start: 2020-10-20 — End: 2021-10-20

## 2020-10-20 NOTE — Progress Notes (Signed)
Vaccine Screening Questions    Interpreter: No    1. Are you allergic to Latex? NO    2.  Have you had a serious reaction or an allergic reaction to a vaccine?  NO    3.  Currently have a moderate or severe illness, including fever?  NO    4.  Ever had a seizure or any neurological problem associated with a vaccine? (DTaP/TDaP/DTP pertinent) NO    5.  Is patient receiving any live vaccinations today? (Varicella-Chickenpox, MMR-Measles/Mumps/Rubella, Zoster-Shingles, Flumist, Yellow Fever) NOTE: oral rotavirus is exempt  NO    If YES to any of the questions above - Do NOT give vaccine.  Consult with RN or provider in clinic.  (#5 can be YES if all Live vaccine questions are answered NO)    If NO to all questions above - Patient may receive vaccine.    6. Do you need to receive the Flu vaccine today? YES - Additional Flu Questions  Flu Vaccine Screening Questions:    Ever had a serious allergic reaction to eggs?  NO    Ever had Guillain-Barre syndrome associated with a vaccine? NO    Less than 6 months old? NO    If YES to any of the Flu questions above - NO Flu Vaccine to be given.  Patient may consult provider as needed.    If NO to all questions above - Patient may receive Flu Shot (IM)    Is the patient requesting Flumist? NO    If between 6 months and 72 years of age, was flu vaccine received last year?  NO  If NO to above question:   Children who are receiving influenza vaccine for the first time - administer 2 doses of the current influenza vaccine (separated by at least 4 weeks).          All patients are encouraged to wait 15 minutes before leaving after receiving any vaccine.    VIS given 10/20/2020 by Juliene Pina, [place].

## 2020-10-28 ENCOUNTER — Ambulatory Visit (INDEPENDENT_AMBULATORY_CARE_PROVIDER_SITE_OTHER): Payer: PPO | Admitting: Nurse Practitioner

## 2020-10-28 VITALS — BP 106/67 | HR 75 | Temp 98.2°F | Wt 154.2 lb

## 2020-10-28 DIAGNOSIS — K130 Diseases of lips: Secondary | ICD-10-CM

## 2020-10-28 MED ORDER — DOXYCYCLINE MONOHYDRATE 100 MG OR CAPS
100.0000 mg | ORAL_CAPSULE | Freq: Two times a day (BID) | ORAL | 0 refills | Status: DC
Start: 2020-10-28 — End: 2021-05-04

## 2020-10-28 NOTE — Patient Instructions (Signed)
Your medical care was provided today by: Jaclyn Carew Jean-Baptiste, [place], ARNP-BC  Thank You for the opportunity to serve you.    Special Instructions:  Unless otherwise instructed, please see your Primary Care Provider (PCP)  in 2-3 days if your symptoms persist, or sooner if you worsen, or change dramatically.      If you are unable to see or do not have a PCP,  go to the nearest Hospital Emergency Department (ER) or return to the Urgent Care Center.      Always call 9-1-1 immediately if you develop a life threatening emergency.    Unless told otherwise please take all medications as directed and complete prescription therapies as directed.      If prescribed do not to take prescribed medication(s) for pain while operating a vehicle or heavy machinery due to potential side effects such as drowsiness and/or dizziness.     Other special instructions are listed below unless otherwise specified:

## 2020-10-28 NOTE — Progress Notes (Signed)
Chief Complaint   Patient presents with    Derm Problem     Cut on R face near lips, hurts & burns, redness,        SUBJECTIVE:  Cassie Contreras is an 70 year old female who presents with infection to right to lip x       Bump to right lip. Been picking at it and now having swelling and erythema and painful.     ROS:   Constitutional: Negative    Eyes: Negative    Ears, Nose, Mouth, Throat: Negative    Cardiovascular: Negative    Respiratory: Negative    Gastrointestinal: Negative    Musculoskeletal: Negative    Skin: As noted in HPI above   Neurological: Negative       Patient Active Problem List   Diagnosis    Os peroneum syndrome of left foot    S/P foot & ankle surgery, left    Neuritis of left sural nerve    Recurrent UTI    Osteopenia of spine    Atrophic vaginitis    Hormone replacement therapy    Thyroid nodule    Mixed incontinence    Pelvic floor weakness    Obstructive sleep apnea    Benign hypertension    Anxiety    Breast lump on right side at 11 o'clock position    Migraine with aura and without status migrainosus, not intractable    New onset of headaches after age 70    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 21 capsule 0    bimatoprost (Latisse) 0.03 % topical solution Apply 1 application topically at bedtime. Apply upper eyelid at base of eyelashes with applicator. Blot excess solution. 3 mL 0    budesonide 180 MCG/ACT inhaler Inhale 1 puff by mouth. (Patient not taking: Reported on 10/28/2020)      clonazePAM 0.5 MG tablet Take 1 tablet (0.5 mg) by mouth daily as needed (anxiety). 20 tablet 0    fluocinolone-hydroquinone-tretinoin 0.01-4-0.05 % cream Apply topically. (Patient not taking: Reported on 10/28/2020)      memantine 10 MG tablet Take 1 tablet (10 mg) by mouth daily. (Patient not taking: Reported on 10/28/2020)  30 tablet 11    omeprazole 20 MG DR capsule Take 1 capsule (20 mg) by mouth daily on an empty stomach. 90 capsule 1    traMADol 50 MG tablet Take 1 tablet (50 mg) by mouth every 4 hours as needed for moderate pain or severe pain. 30 tablet 0    tretinoin 0.05 % cream APPLY TO AFFECTED AREA(S) ON FACE AT BEDTIME 20 g 1    Vagifem 10 MCG vaginal tablet INSERT ONE TABLET VAGINALLY THREE TIMES A WEEK 36 tablet 1    valACYclovir 500 MG tablet Take 1 tablet (500 mg) by mouth daily. 30 tablet 1     Facility-Administered Medications Prior to Visit   Medication Dose Route Frequency Provider Last Rate Last Admin    influenza quadrivalent adjuvanted vaccine (Fluad) injection 0.5 mL  0.5 mL Intramuscular Once Joneen Roach Lauro Regulus, MD           Review of patient's allergies indicates:  Allergies   Allergen Reactions    Erythromycin Other     Strange smells but no rash  or trouble breathing.    Ibuprofen      TX FROM PAPER CHART      Pneumovax [Pneumococcal Polysaccharide Vaccine]      Rash/fever (arm)     Sulfa Antibiotics      TX FROM PAPER CHART         Social History     Tobacco Use    Smoking status: Former Smoker    Smokeless tobacco: Never Used   Substance Use Topics    Alcohol use: Yes     Alcohol/week: 1.0 standard drink     Types: 1 Glasses of wine per week     Comment: occ.         OBJECTIVE:  BP 106/67    Pulse 75    Temp 36.8 C (Temporal)    Wt 69.9 kg (154 lb 3.2 oz)    SpO2 96%    BMI 23.45 kg/m     Physical Exam  HENT:      Head: Normocephalic.      Nose: Nose normal.      Mouth/Throat:      Mouth: Mucous membranes are moist.      Pharynx: No posterior oropharyngeal erythema.      Comments: Tender firm nodule that is erythematous to the corner of right lip. Upper and lower right arm of lip and right lower cheek is swollen and slightly tender to the touch.   Eyes:      Pupils: Pupils are equal, round, and reactive to light.   Cardiovascular:      Rate and Rhythm: Normal rate and regular rhythm.    Pulmonary:      Effort: Pulmonary effort is normal.      Breath sounds: Normal breath sounds.   Musculoskeletal:      Cervical back: Normal range of motion and neck supple.   Lymphadenopathy:      Cervical: No cervical adenopathy.   Skin:            Comments: Tender firm nodule that is erythematous to the corner of right lip. Upper and lower right arm of lip and right lower cheek is swollen and slightly tender to the touch.      Neurological:      Mental Status: She is alert.           No results found for any visits on 10/28/20.            ASSESSMENT/PLAN  (K13.0) Cellulitis, lip  (primary encounter diagnosis)  Plan: doxycycline monohydrate 100 MG capsule      Warm compress  OTC medication as needed as pain.   AVS-reviewed  Medication: take as directed. Discussed medication in detail including dosing, side effects, and interactions.  F/U with primary care provider or urgent care 3-4 days if symptoms do not improve or before if symptoms gets worse.   The patient /parent or guardian indicates understanding of these issues and agrees with the plan.      Brigitte Pulse, ARNP  10/28/2020

## 2020-10-29 ENCOUNTER — Telehealth (INDEPENDENT_AMBULATORY_CARE_PROVIDER_SITE_OTHER): Payer: Self-pay | Admitting: Family Medicine

## 2020-10-29 DIAGNOSIS — L03211 Cellulitis of face: Secondary | ICD-10-CM

## 2020-10-29 MED ORDER — FLUCONAZOLE 150 MG OR TABS
150.0000 mg | ORAL_TABLET | Freq: Once | ORAL | 0 refills | Status: AC
Start: 2020-10-29 — End: 2020-10-29

## 2020-10-29 MED ORDER — CEPHALEXIN 500 MG OR CAPS
1000.0000 mg | ORAL_CAPSULE | Freq: Two times a day (BID) | ORAL | 0 refills | Status: DC
Start: 2020-10-29 — End: 2021-01-03

## 2020-10-29 NOTE — Telephone Encounter (Signed)
LVM for pt this morning that apt is scheduled for tomorrow.     Contacted pt again and was able to confirm apt for tomorrow. Closing TE.

## 2020-10-29 NOTE — Telephone Encounter (Signed)
Patient contacted me re cellulitis of RIGHT cheek/lip that seems worse today.  Saw UCC yesterday and started doxycycline.  S/w patient and I was able to use FaceTime to see patient's wound, which appears to developed a small pustule at the RIGHT lip edge w mild diffuse swelling of the RIGHT lower cheek without erythema echymosis or streaking. Concerned about developing abscess so adding cephalexin and also send diflucan for PRN yeast infection.  Also advised frequent damp/hot compress to area today.  Also asked clinic to add her on as double book w Dr. Dorna Bloom tomorrow PRN not better betw today and tomorrow for possible needle I&D if needed.

## 2020-10-30 ENCOUNTER — Encounter (INDEPENDENT_AMBULATORY_CARE_PROVIDER_SITE_OTHER): Payer: PPO | Admitting: Family Medicine

## 2020-11-02 ENCOUNTER — Telehealth (INDEPENDENT_AMBULATORY_CARE_PROVIDER_SITE_OTHER): Payer: Self-pay | Admitting: Family Medicine

## 2020-11-02 NOTE — Telephone Encounter (Signed)
Routing to provider for review and advisement.

## 2020-11-11 ENCOUNTER — Other Ambulatory Visit: Payer: Self-pay

## 2020-11-18 ENCOUNTER — Other Ambulatory Visit (INDEPENDENT_AMBULATORY_CARE_PROVIDER_SITE_OTHER): Payer: Self-pay | Admitting: Family Medicine

## 2020-11-18 DIAGNOSIS — N761 Subacute and chronic vaginitis: Secondary | ICD-10-CM

## 2020-11-19 MED ORDER — VAGIFEM 10 MCG VA TABS
ORAL_TABLET | VAGINAL | 1 refills | Status: DC
Start: 2020-11-19 — End: 2021-02-28

## 2020-12-02 ENCOUNTER — Encounter (INDEPENDENT_AMBULATORY_CARE_PROVIDER_SITE_OTHER): Payer: Self-pay | Admitting: Family Medicine

## 2020-12-02 NOTE — Telephone Encounter (Signed)
Replied to mychart

## 2020-12-08 ENCOUNTER — Other Ambulatory Visit (INDEPENDENT_AMBULATORY_CARE_PROVIDER_SITE_OTHER): Payer: PPO

## 2020-12-08 ENCOUNTER — Other Ambulatory Visit (INDEPENDENT_AMBULATORY_CARE_PROVIDER_SITE_OTHER): Payer: PPO | Admitting: Family Medicine

## 2020-12-08 ENCOUNTER — Encounter (INDEPENDENT_AMBULATORY_CARE_PROVIDER_SITE_OTHER): Payer: Self-pay | Admitting: Family Medicine

## 2020-12-08 DIAGNOSIS — R300 Dysuria: Secondary | ICD-10-CM

## 2020-12-08 DIAGNOSIS — N3001 Acute cystitis with hematuria: Secondary | ICD-10-CM

## 2020-12-08 LAB — U/A AUTO DIPSTICK ONLY, ONSITE
Bilirubin, Urine: NEGATIVE
Glucose, Urine: NEGATIVE mg/dL
Ketones, URN: NEGATIVE mg/dL
Nitrite, URN: NEGATIVE
Protein: NEGATIVE mg/dL
Specific Gravity, Urine: 1.01 (ref 1.005–1.030)
Urobilinogen, URN: 0.2 E.U./dL (ref 0.2–1.0)
pH, URN: 6 (ref 5.0–8.0)

## 2020-12-08 MED ORDER — NITROFURANTOIN MONOHYD MACRO 100 MG OR CAPS
100.0000 mg | ORAL_CAPSULE | Freq: Two times a day (BID) | ORAL | 0 refills | Status: DC
Start: 2020-12-08 — End: 2021-01-03

## 2020-12-08 MED ORDER — PHENAZOPYRIDINE HCL 200 MG OR TABS
200.0000 mg | ORAL_TABLET | Freq: Two times a day (BID) | ORAL | 0 refills | Status: DC
Start: 2020-12-08 — End: 2021-02-18

## 2020-12-08 NOTE — Telephone Encounter (Signed)
S/w patient, symptoms c/w UTI. See order detail.

## 2020-12-08 NOTE — Progress Notes (Signed)
Patient left urine sample

## 2020-12-10 ENCOUNTER — Telehealth (INDEPENDENT_AMBULATORY_CARE_PROVIDER_SITE_OTHER): Payer: PPO | Admitting: Family

## 2021-01-03 ENCOUNTER — Telehealth (INDEPENDENT_AMBULATORY_CARE_PROVIDER_SITE_OTHER): Payer: Self-pay | Admitting: Family Medicine

## 2021-01-03 ENCOUNTER — Other Ambulatory Visit (INDEPENDENT_AMBULATORY_CARE_PROVIDER_SITE_OTHER): Payer: Self-pay | Admitting: Family Medicine

## 2021-01-03 DIAGNOSIS — R399 Unspecified symptoms and signs involving the genitourinary system: Secondary | ICD-10-CM

## 2021-01-03 DIAGNOSIS — N3001 Acute cystitis with hematuria: Secondary | ICD-10-CM

## 2021-01-03 LAB — URINALYSIS WITH REFLEX CULTURE
Bilirubin (Qual), URN: NEGATIVE
Epith Cells_Renal/Trans,URN: NEGATIVE /HPF
Glucose Qual, URN: NEGATIVE
Ketones, URN: NEGATIVE
Leukocyte Esterase, URN: POSITIVE — AB
Nitrite, URN: NEGATIVE
Protein (Alb Semiquant), URN: NEGATIVE
Specific Gravity, URN: 1.008 g/mL (ref 1.006–1.027)
pH, URN: 5.5 (ref 5.0–8.0)

## 2021-01-03 LAB — REFLEX CULTURE FOR UA

## 2021-01-03 MED ORDER — NITROFURANTOIN MONOHYD MACRO 100 MG OR CAPS
100.0000 mg | ORAL_CAPSULE | Freq: Two times a day (BID) | ORAL | 0 refills | Status: DC
Start: 2021-01-03 — End: 2021-02-18

## 2021-01-03 NOTE — Telephone Encounter (Signed)
Patient contacted me to inform me that her UTI symptoms are back and has hematuria.  She will drop off specimen at [facility]

## 2021-01-04 LAB — URINE C/S: Culture: 11000 — AB

## 2021-01-07 ENCOUNTER — Encounter (HOSPITAL_BASED_OUTPATIENT_CLINIC_OR_DEPARTMENT_OTHER): Payer: Self-pay | Admitting: Neurology

## 2021-01-10 ENCOUNTER — Telehealth (INDEPENDENT_AMBULATORY_CARE_PROVIDER_SITE_OTHER): Payer: Self-pay | Admitting: Family Medicine

## 2021-01-10 DIAGNOSIS — L03011 Cellulitis of right finger: Secondary | ICD-10-CM

## 2021-01-10 MED ORDER — CEPHALEXIN 500 MG OR CAPS
500.0000 mg | ORAL_CAPSULE | Freq: Three times a day (TID) | ORAL | 0 refills | Status: DC
Start: 2021-01-10 — End: 2021-02-18

## 2021-01-10 NOTE — Telephone Encounter (Signed)
Patient contacted me re increasing pain redness and swelling of RIGHT index after she accidentally clipped some skin.  Abx sent and instructed to get only if not improving after heat application

## 2021-01-15 ENCOUNTER — Other Ambulatory Visit (INDEPENDENT_AMBULATORY_CARE_PROVIDER_SITE_OTHER): Payer: Self-pay | Admitting: Family Medicine

## 2021-01-15 DIAGNOSIS — K219 Gastro-esophageal reflux disease without esophagitis: Secondary | ICD-10-CM

## 2021-01-19 MED ORDER — OMEPRAZOLE 20 MG OR CPDR
20.0000 mg | DELAYED_RELEASE_CAPSULE | Freq: Every day | ORAL | 0 refills | Status: DC
Start: 2021-01-19 — End: 2021-04-20

## 2021-01-29 ENCOUNTER — Other Ambulatory Visit (INDEPENDENT_AMBULATORY_CARE_PROVIDER_SITE_OTHER): Payer: Self-pay | Admitting: Neurology

## 2021-01-29 DIAGNOSIS — R519 Headache, unspecified: Secondary | ICD-10-CM

## 2021-01-29 MED ORDER — MEMANTINE HCL 10 MG OR TABS
10.0000 mg | ORAL_TABLET | Freq: Every day | ORAL | 11 refills | Status: DC
Start: 2021-01-29 — End: 2022-03-27

## 2021-02-08 ENCOUNTER — Other Ambulatory Visit (HOSPITAL_BASED_OUTPATIENT_CLINIC_OR_DEPARTMENT_OTHER): Payer: Self-pay | Admitting: Family Medicine

## 2021-02-08 ENCOUNTER — Telehealth (INDEPENDENT_AMBULATORY_CARE_PROVIDER_SITE_OTHER): Payer: Self-pay

## 2021-02-08 DIAGNOSIS — N952 Postmenopausal atrophic vaginitis: Secondary | ICD-10-CM

## 2021-02-08 DIAGNOSIS — Z09 Encounter for follow-up examination after completed treatment for conditions other than malignant neoplasm: Secondary | ICD-10-CM

## 2021-02-08 NOTE — Telephone Encounter (Signed)
See attached PA request.

## 2021-02-18 ENCOUNTER — Telehealth (INDEPENDENT_AMBULATORY_CARE_PROVIDER_SITE_OTHER): Payer: Self-pay | Admitting: Family Medicine

## 2021-02-18 ENCOUNTER — Other Ambulatory Visit (INDEPENDENT_AMBULATORY_CARE_PROVIDER_SITE_OTHER): Payer: Self-pay | Admitting: Family Medicine

## 2021-02-18 DIAGNOSIS — R300 Dysuria: Secondary | ICD-10-CM

## 2021-02-18 DIAGNOSIS — N3001 Acute cystitis with hematuria: Secondary | ICD-10-CM

## 2021-02-18 LAB — URINALYSIS WITH REFLEX CULTURE
Bacteria, URN: NONE SEEN
Bilirubin (Qual), URN: NEGATIVE
Epith Cells_Renal/Trans,URN: NEGATIVE /HPF
Glucose Qual, URN: NEGATIVE
Ketones, URN: NEGATIVE
Leukocyte Esterase, URN: POSITIVE — AB
Nitrite, URN: NEGATIVE
Occult Blood, URN: NEGATIVE
Protein (Alb Semiquant), URN: NEGATIVE
RBC, URN: NEGATIVE /HPF
Specific Gravity, URN: 1.014 g/mL (ref 1.006–1.027)
pH, URN: 7 (ref 5.0–8.0)

## 2021-02-18 LAB — REFLEX CULTURE FOR UA

## 2021-02-18 MED ORDER — NITROFURANTOIN MONOHYD MACRO 100 MG OR CAPS
100.0000 mg | ORAL_CAPSULE | Freq: Two times a day (BID) | ORAL | 0 refills | Status: AC
Start: 2021-02-18 — End: 2021-02-25

## 2021-02-18 MED ORDER — PHENAZOPYRIDINE HCL 200 MG OR TABS
200.0000 mg | ORAL_TABLET | Freq: Two times a day (BID) | ORAL | 0 refills | Status: DC
Start: 2021-02-18 — End: 2021-11-10

## 2021-02-18 NOTE — Telephone Encounter (Signed)
Contacted by patient reporting waking up w UTI symptoms. See order detail.

## 2021-02-20 LAB — URINE C/S
Culture: 1000 — AB
Culture: 11000 — AB

## 2021-02-21 ENCOUNTER — Encounter (INDEPENDENT_AMBULATORY_CARE_PROVIDER_SITE_OTHER): Payer: Self-pay | Admitting: Family Medicine

## 2021-02-21 ENCOUNTER — Telehealth (HOSPITAL_BASED_OUTPATIENT_CLINIC_OR_DEPARTMENT_OTHER): Payer: Self-pay | Admitting: Urology

## 2021-02-21 NOTE — Telephone Encounter (Signed)
Talked to [place] and offered 2/22 at 2:40pm. She will call patient and then get back is she is unable to schedule since the slot is on hold.

## 2021-02-21 NOTE — Telephone Encounter (Signed)
RETURN CALL: Voicemail - General Message      SUBJECT:  Appointment Request     REASON FOR VISIT: Recurring UTI  PREFERRED DATE/TIME: any time   ADDITIONAL INFORMATION: [facility] Patient. Patient was previously seen by Dr Hyacinth Meeker but last appointment was 06/29/2017. Would like to see if patient can be seen sooner in your clinic.  Left a voicemail for PSS supervisor to call me back at [phone]

## 2021-02-21 NOTE — Telephone Encounter (Signed)
Routing to Dr Hei's covering provider to review and advise

## 2021-02-21 NOTE — Result Encounter Note (Signed)
Nitrofurantoin already prescribed

## 2021-02-23 ENCOUNTER — Encounter (INDEPENDENT_AMBULATORY_CARE_PROVIDER_SITE_OTHER): Payer: Self-pay | Admitting: Family Medicine

## 2021-02-23 NOTE — Telephone Encounter (Signed)
Pt states she has an appt tomorrow at [facility] for an x-ray of the L ankle. Pt will have them send the report to [place]. Pt has an appt with Dr. Tarry Kos next Wednesday. Routing to Dr. Tarry Kos.

## 2021-02-24 NOTE — Progress Notes (Signed)
I, Dr. Rezaei , have reviewed the documentation provided by my scribe indicated above and affirm that it is an accurate restatement of services personally performed by me. I understand and acknowledge that I am responsible for the accuracy of the documentation.  Signed by Jachob Mcclean Rezaei M.D.

## 2021-02-24 NOTE — Progress Notes (Signed)
71 year old female here for follow up examination of age related macular degeneration in both eyes. She was diagnosed with age related macular degeneration in April 2022 by her comprehensive ophthalmologist (Dr. Willaim Bane at [facility]). No family history of ARMD. Prior ocular history notable for dry eyes and bilateral cataract surgery (March 2022), with good results. No metamorphopsia on Amsler monitoring. She has been taking AREDS 2. No medical history. Patient didn't notice any major change in vision. No flashing lights and no major change in floaters . Patient does not have pain. Reports occasional blurriness. She has not used the amsler grid in a few weeks. She does take AREDS2 vitamins.     No past ocular history of trauma.  No family history of retinal detachment.    Imaging:    5/9/2022OCT shows:  Right AST:MHDQQIW macular thickness was 270 microns. Foveal contour was not blunted, There is no intraretinal fluid/subretinal fluid.  Left eye: central macular thickness was 271 microns. Foveal contour was not blunted, There is no intraretinal fluid/subretinal fluid.    A/P:  1. Non exudative ARMD:  - OCT was done to evaluate the architecture of the retina. 2/9/2023OCT shows:                             Right LNL:GXQJJHE macular thickness was 275 microns. Foveal contour was not blunted, There is no intraretinal fluid/subretinal fluid.                             Left eye: central macular thickness was 272 microns. Foveal contour was not blunted, There is no intraretinal fluid/subretinal fluid.  - Right eye exam and OCT shows: Early dry ARMD  - Left eye exam and OCT shows: Early dry ARMD  - Discussed the options of AREDS II  - No smoking  - Heart healthy diet  - Cardiovascular optimization with PCP  - Will monitor the vision at home with amsler grid  - Wet AMD conversion precautions reviewed  - Will recommend tight BS and BP control    2. Pseudophakia OU:  - NVS  - will monitor     3. Dry eyes, OU  - recommended frequent use  of artificial tears throughout the day.    RTC 1 year, sooner as needed    Portions of this note were documented by Kathrine Cords, medical scribe, on 03/03/2021 with oversight by Dr. Burnett Harry

## 2021-02-28 ENCOUNTER — Other Ambulatory Visit (HOSPITAL_BASED_OUTPATIENT_CLINIC_OR_DEPARTMENT_OTHER): Payer: Self-pay

## 2021-02-28 MED ORDER — ESTRADIOL 10 MCG VA TABS
10.0000 ug | ORAL_TABLET | VAGINAL | 3 refills | Status: DC
Start: 2021-02-28 — End: 2021-06-08

## 2021-02-28 NOTE — Telephone Encounter (Signed)
Prior Authorization Denial    The patients insurance has denied coverage:    Options for clinic:     [x]  Recommend changing to an alternative formulary medication:        (The prescription can be entered using this current telephone encounter)     [x]  Patient may pay out of pocket for this item     [x]  Clinic may appeal. [phone] option 3    Prescription Insurance: CAMB [id number] MAPD INDIV ([employer] [place])  Pharmacy: [facility] [phone] [id number] [address] [place] [phone]    Medication: Vagifem 10 MCG Vaginal Tablet 12/28    Reason for Denial:       Denial # or code: [id number]      Additional Comments:  Denial letter has been uploaded to Media.

## 2021-02-28 NOTE — Addendum Note (Signed)
Addended by: [name] on: 02/28/2021 02:37 PM     Modules accepted: Orders

## 2021-02-28 NOTE — Telephone Encounter (Signed)
Prior Authorization Request Pending    Prescription Insurance: CAMB [id number] 002 MAPD INDIV (PRIME R[employer] [place])  Pharmacy: [facility] [phone] [place] [phone]    Medication: Vagifem 10 MCG Vaginal Tablet 12/28  [facility] Workflow Exemption Request: No  Date Submitted: 02/28/21    Type of Request:  x] Cover My Meds Key: [id number]    Please allow up to 3-5 business days for review/response.

## 2021-03-02 ENCOUNTER — Encounter (INDEPENDENT_AMBULATORY_CARE_PROVIDER_SITE_OTHER): Payer: PPO | Admitting: Family Medicine

## 2021-03-02 ENCOUNTER — Encounter (INDEPENDENT_AMBULATORY_CARE_PROVIDER_SITE_OTHER): Payer: Self-pay

## 2021-03-03 ENCOUNTER — Ambulatory Visit (INDEPENDENT_AMBULATORY_CARE_PROVIDER_SITE_OTHER): Payer: PPO | Admitting: Ophthalmology

## 2021-03-03 DIAGNOSIS — Z961 Presence of intraocular lens: Secondary | ICD-10-CM

## 2021-03-03 DIAGNOSIS — H3530 Unspecified macular degeneration: Secondary | ICD-10-CM

## 2021-03-03 NOTE — Patient Instructions (Signed)
Thank you for coming in today.  During today's visit we reviewed only your ophthalmology (eye-related) medications.  Please follow up with your primary care provider for any questions regarding other medications.    If your eyes were dilated during today's visit, the average dilation will last 4 to 6 hours and may impact your overall vision during this period. Please use caution during this period.    If you need to schedule or change a follow up appointment, please call [phone].  Our phone lines are open Mon-Fri 7am-7pm & Saturdays 8am-4:30pm.

## 2021-03-07 ENCOUNTER — Ambulatory Visit (INDEPENDENT_AMBULATORY_CARE_PROVIDER_SITE_OTHER): Payer: PPO | Admitting: Podiatrist

## 2021-03-07 DIAGNOSIS — M25572 Pain in left ankle and joints of left foot: Secondary | ICD-10-CM

## 2021-03-07 NOTE — Progress Notes (Signed)
Accompanied by:  patient was unaccompanied    Referring provider/PCP:  Seen before, New Problem  Went to [facility] got xray and is scheduled to have MRI.  The location/patient point to the area of pain/concern is Left Ankle, medial side.    The duration of current condition is 2 weeks.    It began gradually.    The precipitating event was none, thinks maybe over use due to walking, working out .    Current symptoms are sharp with walking, .    Patient denies: injury.    The quality of pain is aching and sharp.    The level the pain/severity of pain is moderate.    The condition is comes and goes.    The affected area is made worse by activty.    Past treatments/studies include was seen at [facility], was given a ASO brace, feels better.    The patient states slight improvement and moderate improvement with treatment.    Previous diagnostic test/evaluation:  seen by podiatrist, x-ray and MRI.    Patient's activity limitation is limited in walking and limited in recreation.      Prior foot/ankle surgeries: 2014, repair ankle joints

## 2021-03-07 NOTE — Progress Notes (Signed)
Patient: Cassie Contreras   Patient DOB: 1950-10-31     DOS:  03/07/2021     Accompanied by:  patient was unaccompanied    Chief Complaint: [name] is a 71 year old year old female who is former patient presents today with new complaint(s):   Chief Complaint   Patient presents with    Left Ankle - Pain        History of present Illness:    (history is obtained by MA/RN and edited by Dr. Selena Batten)  Referring provider/PCP:  Seen before, New Problem  Went to [facility] got xray and is scheduled to have MRI.  The location/patient point to the area of pain/concern is Left Ankle, medial side.    The duration of current condition is 2 weeks.    It began gradually.    The precipitating event was none, thinks maybe over use due to walking, working out .    Current symptoms are sharp with walking.    Patient denies: injury.    The quality of pain is aching and sharp.    The level the pain/severity of pain is moderate.    The condition is comes and goes.    The affected area is made worse by activty.    Past treatments/studies include was seen at [facility], was given a ASO bracefeels better.    The patient states slight improvement and moderate improvement with treatment.    Previous diagnostic test/evaluation:  seen by podiatrist - Dr. Antony Blackbird at [facility], x-ray and MRI was based on diagnosis (posterior tibialis tendinitis)  Patient's activity limitation is limited in walking and limited in recreation.      Prior foot/ankle surgeries: 2014, repair ankle joints    Goals: evaluate current condition    PCP:  [name], MD     PMH:   Past Medical History:   Diagnosis Date    Abdominal pain     Anxiety     Arthritis     Asthma     Constipation     Depression     GERD (gastroesophageal reflux disease)     Headache     Heart palpitations     History of irritable bowel syndrome     Hormone disorder     Metatarsal stress fracture of right foot     TX FROM PAPER CHART      Primary insomnia     Squamous cell carcinoma in  situ of skin     Thyroid disease     UTI (urinary tract infection)         Review of patient's allergies indicates:  Allergies   Allergen Reactions    Erythromycin Other     Strange smells but no rash or trouble breathing.    Ibuprofen      TX FROM PAPER CHART      Pneumovax [Pneumococcal Polysaccharide Vaccine]      Rash/fever (arm)     Sulfa Antibiotics      TX FROM PAPER CHART          Medications:   Outpatient Medications Prior to Visit   Medication Sig Dispense Refill    bimatoprost (Latisse) 0.03 % topical solution Apply 1 application topically at bedtime. Apply upper eyelid at base of eyelashes with applicator. Blot excess solution. 3 mL 0    budesonide 180 MCG/ACT inhaler Inhale 1 puff by mouth. (Patient not taking: Reported on 10/28/2020)      clonazePAM 0.5 MG  tablet Take 1 tablet (0.5 mg) by mouth daily as needed (anxiety). 20 tablet 0    estradiol 10 MCG vaginal tablet Place 1 tablet (10 mcg) into the vagina 3 times a week. 36 tablet 3    fluocinolone-hydroquinone-tretinoin 0.01-4-0.05 % cream Apply topically. (Patient not taking: Reported on 10/28/2020)      memantine 10 MG tablet Take 1 tablet (10 mg) by mouth daily. 30 tablet 11    omeprazole 20 MG DR capsule Take 1 capsule (20 mg) by mouth daily on an empty stomach. 90 capsule 0    phenazopyridine (Pyridium) 200 MG tablet Take 1 tablet (200 mg) by mouth 2 times a day after meals. For urinary discomfort. 10 tablet 0    traMADol 50 MG tablet Take 1 tablet (50 mg) by mouth every 4 hours as needed for moderate pain or severe pain. 30 tablet 0    tretinoin 0.05 % cream APPLY TO AFFECTED AREA(S) ON FACE AT BEDTIME 20 g 1    valACYclovir 500 MG tablet Take 1 tablet (500 mg) by mouth daily. 30 tablet 1     Facility-Administered Medications Prior to Visit   Medication Dose Route Frequency Provider Last Rate Last Admin    influenza quadrivalent adjuvanted vaccine (Fluad) injection 0.5 mL  0.5 mL Intramuscular Once Piehl, Lauro RegulusJanet Howard, MD             PSH:   Past Surgical History:   Procedure Laterality Date    Tonsilectomy      Childhood    URETHRAL DILATION/DVIU          Family History:  family history includes Heart Attack (age of onset: 3459) in her sister.    Social History:  Social History     Socioeconomic History    Marital status: Married     Spouse name: Not on file    Number of children: Not on file    Years of education: Not on file    Highest education level: Not on file   Occupational History    Not on file   Tobacco Use    Smoking status: Former    Smokeless tobacco: Never   Substance and Sexual Activity    Alcohol use: Yes     Alcohol/week: 1.0 standard drink     Types: 1 Glasses of wine per week     Comment: occ.    Drug use: No    Sexual activity: Yes     Partners: Male   Other Topics Concern    Not on file   Social History Narrative    Not on file     Social Determinants of Health     Financial Resource Strain: Not on file   Food Insecurity: Not on file   Transportation Needs: Not on file   Physical Activity: Not on file   Stress: Not on file   Social Connections: Not on file   Intimate Partner Violence: Not on file   Housing Stability: Not on file        ROS is positive for musculoskeletal as mentioned in the HPI and as stated in the past medical history.  The patient's review of symptoms is otherwise unremarkable and documented in the electronic health record on 03/07/2021.    Physical Exam:   There were no vitals filed for this visit.     General: The patient is 71 year old year old female White  with constitutional symptom: appears age stated.    Psychiatric/mood: normal  mood and affect.    Neurological: alert and oriented x3.    Vascular:   RIGHT:  Dorsalis pedis pulse is palpable.  Posterior tibial pulse is palpable. Capillary filling time note to be immediate on the distal hallux.      LEFT:  Dorsalis pedis pulse is palpable.  Posterior tibial pulse is palpable. Capillary filling time note to be immediate on the distal  hallux.      Neurologic:  RIGHT:  The sensation is intact on the foot/ankle.  Motor coordination is intact with normal muscle tone.  There is no manifestations of pathological reflexes noted.     LEFT:  The sensation is intact on the foot/ankle.  Motor coordination is intact with normal muscle tone.  There is no manifestations of pathological reflexes noted.      Dermatological:   RIGHT:   Upon inspection, the skin note to have normal texture/tone/turgor.  There is no ulceration/open wound or dermatosis present.  Integument coloration is within normal range and temperature is within normal range.  The hair growth is normal.    LEFT:   Upon inspection, the skin note to have normal texture/tone/turgor.  There is no ulceration/open wound or dermatosis present.  Integument coloration is within normal range and temperature is within normal range.  The hair growth is normal.      Musculoskeletal:   RIGHT:  There is no pain to palpation, the range of motion within normal limits and pain free, manual muscle testing 5/5 all extrinsic muscles to the foot.  There is no digital deformity noted. There is no forefoot deformity noted.  Patient has pes cavus WITHOUT plantarflexed 1st ray and rectus heel.       LEFT:  There is mild-moderate pain on palpation of the distal medial malleolus. There is no swelling noted over the affect region.  There is no warmth to touch and ecchymosis of the affected area.  There is no pain along posterior tibialis tendon and no pain on the ankle joint. There is no crepitation on ankle joint ROM        Assessment:   (M25.572) Left ankle pain - medial malleolus pain  (primary encounter diagnosis)    Plan:  Detailed education and discussion took place with the patient with regards to medial ankle [place] vs medial ankle impingement, left but there is no evidence of posterior tibialis tendon.  She has h/o excision of bone cyst with autogenous bone graft on medial malleolus on 08/01/2012.   We discussed that  the following structure is symptomatic - tip of medial malleolus.   We explained that sprained ligaments or tendons can take time to heal.  Following treatment options were recommended - continue current care with ASO ankle brace and f/u after diagnostic MRI which was ordered by Dr. Antony Blackbird at [facility].      We have reviewed previous X-ray finding and detail discussion took place with the patient.      Return appointment: follow up for MRI review    Randa Spike DPM, FACFAS  Reconstructive Ankle and Foot Surgeon  [facility]

## 2021-03-11 ENCOUNTER — Other Ambulatory Visit (HOSPITAL_COMMUNITY): Payer: Self-pay

## 2021-03-15 ENCOUNTER — Encounter (INDEPENDENT_AMBULATORY_CARE_PROVIDER_SITE_OTHER): Payer: Self-pay | Admitting: Podiatrist

## 2021-03-15 ENCOUNTER — Encounter (HOSPITAL_BASED_OUTPATIENT_CLINIC_OR_DEPARTMENT_OTHER): Payer: Self-pay | Admitting: Urology

## 2021-03-15 NOTE — Telephone Encounter (Signed)
Pt called, she has had her MRI and has a CD. She wonders if she can get in sooner than 03/23/21 for results appt. Her ankle is really hurting and she can hardly walk.

## 2021-03-15 NOTE — Telephone Encounter (Signed)
Sent message for her to be seen on monday

## 2021-03-15 NOTE — Telephone Encounter (Signed)
Apt changed to telemedicine.

## 2021-03-16 ENCOUNTER — Encounter (HOSPITAL_BASED_OUTPATIENT_CLINIC_OR_DEPARTMENT_OTHER): Payer: PPO | Admitting: Urology

## 2021-03-16 ENCOUNTER — Ambulatory Visit: Payer: PPO | Attending: Urology | Admitting: Urology

## 2021-03-16 DIAGNOSIS — N390 Urinary tract infection, site not specified: Secondary | ICD-10-CM | POA: Insufficient documentation

## 2021-03-16 DIAGNOSIS — R350 Frequency of micturition: Secondary | ICD-10-CM | POA: Insufficient documentation

## 2021-03-16 DIAGNOSIS — R300 Dysuria: Secondary | ICD-10-CM | POA: Insufficient documentation

## 2021-03-16 MED ORDER — NITROFURANTOIN MONOHYD MACRO 100 MG OR CAPS
100.0000 mg | ORAL_CAPSULE | Freq: Two times a day (BID) | ORAL | 1 refills | Status: AC
Start: 2021-03-16 — End: 2021-03-21

## 2021-03-16 MED ORDER — ESTRADIOL 10 MCG VA TABS
10.0000 ug | ORAL_TABLET | VAGINAL | 3 refills | Status: DC
Start: 2021-03-17 — End: 2022-01-01

## 2021-03-16 NOTE — Progress Notes (Signed)
Distant Site Telemedicine Encounter      I conducted this encounter via secure, live, face-to-face video conference with the patient. I reviewed the risks and benefits of telemedicine as pertinent to this visit and the patient agreed to proceed.    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 She was doing well with PFPT and vaginal estrogen-having stopped flomax.    Today she reports concern regarding 3 "utis" in past 3 months. Acute symptoms of urinary frequency/dsyuria following intercourse.  Symptoms resolved with antibiotics.  ? Whether she saw blood or just told she had blood.    Review of labs obtained:  -12/08/20-dip UA only-3+ occult blood, 1+LE  -01/03/21-UA-21-50 WBC, 3-5 RBC, + bact + epi  Mixed flora  -02/18/21->50 WBC, no bacteria, >5 epi citrobacter koseri      She continues to use vagifem 3 times a week, double voids-doesn't think she is having difficulties voiding and her incontinence has largely resolved with PFPT and cessation of flomax. Denies f/c, h/o urinary stones, vaginitis.      Discussed difficulties in knowing next best steps with only one clear culture-other etiologies for her symptoms and UA results-though fact that antibiotics resolved symptoms most consistent with uti.    I have recommended getting UA next week when not symptomatic to ensure urine has cleared of WBC, rBC. I will place standing urine culture order so that she can get culture without an appointment when symptomatic so we can better evaluate episodes. She should come to clinic for PVR in light of h/o voiding issues. Will place RX for antibiotic to begin self-treatment after culture left and if continue to have infections can consider post-coital prophylaxis. She would like generic RX for  vagifem (Brand costs her 800.00). Patient agrees with recommendations.    I spent a total of 30 minutes for the patient's care on the date of the service.

## 2021-03-21 ENCOUNTER — Ambulatory Visit (INDEPENDENT_AMBULATORY_CARE_PROVIDER_SITE_OTHER): Payer: PPO | Admitting: Podiatrist

## 2021-03-21 ENCOUNTER — Other Ambulatory Visit (HOSPITAL_COMMUNITY): Payer: Self-pay

## 2021-03-21 DIAGNOSIS — M958 Other specified acquired deformities of musculoskeletal system: Secondary | ICD-10-CM

## 2021-03-21 DIAGNOSIS — M85679 Other cyst of bone, unspecified ankle and foot: Secondary | ICD-10-CM

## 2021-03-21 NOTE — Progress Notes (Signed)
Accompanied by:  patient was unaccompanied    Cassie Contreras is a 71 year old year old female who presents today for re-evaluation of Left Ankle recheck, go over [facility] Mri.    The patient states .     The current condition has existed for 4 weeks now, .    Patient describes the symptoms as was better, then got worse after activity (biking).     The level the pain moderate.    The condition is worse when active.     Current treatments/recent studies include boot, ice, elevation .     The response to the treatment is not working.     Patient's activity limitation is limited in walking and limited in recreation.

## 2021-03-21 NOTE — Progress Notes (Signed)
Patient: Cassie Contreras   Patient DOB: [birth year]     DOS:  03/21/2021     Accompanied by:  patient was unaccompanied    Subjective:  (history is obtained by MA/RN and edited by Dr. Maudie Mercury)  Rhondi Nold is a 71 year old year old female who presents today for re-evaluation of Left Ankle recheck, go over [facility] Mri.    The patient states make improvement due wearing cast boot/ASO ankle brace.   The current condition has existed for 4 weeks now, .    Patient describes the symptoms as was better, then got worse after activity (biking).     The level the pain moderate.    The condition is worse when active.     Current treatments/recent studies include boot, ice, elevation, short period of cast boot and ASO ankle brace.     The response to the treatment is not working.     Patient's activity limitation is limited in walking and limited in recreation.      Goals: evaluate current condition    PCP:  Hei, Clovis Cao, MD     PMH:   Past Medical History:   Diagnosis Date    Abdominal pain     Anxiety     Arthritis     Asthma     Constipation     Depression     GERD (gastroesophageal reflux disease)     Headache     Heart palpitations     History of irritable bowel syndrome     Hormone disorder     Metatarsal stress fracture of right foot     TX FROM PAPER CHART      Primary insomnia     Squamous cell carcinoma in situ of skin     Thyroid disease     UTI (urinary tract infection)         Review of patient's allergies indicates:  Allergies   Allergen Reactions    Erythromycin Other     Strange smells but no rash or trouble breathing.    Ibuprofen      TX FROM PAPER CHART      Pneumovax [Pneumococcal Polysaccharide Vaccine]      Rash/fever (arm)     Sulfa Antibiotics      TX FROM PAPER CHART          Medications:   Outpatient Medications Prior to Visit   Medication Sig Dispense Refill    bimatoprost (Latisse) 0.03 % topical solution Apply 1 application topically at bedtime. Apply upper eyelid at base of  eyelashes with applicator. Blot excess solution. 3 mL 0    budesonide 180 MCG/ACT inhaler Inhale 1 puff by mouth. (Patient not taking: Reported on 10/28/2020)      clonazePAM 0.5 MG tablet Take 1 tablet (0.5 mg) by mouth daily as needed (anxiety). 20 tablet 0    estradiol 10 MCG vaginal tablet Place 1 tablet (10 mcg) into the vagina 2 times a week. 24 tablet 3    estradiol 10 MCG vaginal tablet Place 1 tablet (10 mcg) into the vagina 3 times a week. (Patient not taking: Reported on 03/16/2021) 36 tablet 3    fluocinolone-hydroquinone-tretinoin 0.01-4-0.05 % cream Apply topically. (Patient not taking: Reported on 10/28/2020)      memantine 10 MG tablet Take 1 tablet (10 mg) by mouth daily. 30 tablet 11    nitrofurantoin SR monohydrate/macrocrystals 100 MG capsule Take 1 capsule (100 mg) by mouth 2 times a  day for 5 days. 10 capsule 1    omeprazole 20 MG DR capsule Take 1 capsule (20 mg) by mouth daily on an empty stomach. 90 capsule 0    phenazopyridine (Pyridium) 200 MG tablet Take 1 tablet (200 mg) by mouth 2 times a day after meals. For urinary discomfort. (Patient not taking: Reported on 03/16/2021) 10 tablet 0    traMADol 50 MG tablet Take 1 tablet (50 mg) by mouth every 4 hours as needed for moderate pain or severe pain. 30 tablet 0    tretinoin 0.05 % cream APPLY TO AFFECTED AREA(S) ON FACE AT BEDTIME 20 g 1    valACYclovir 500 MG tablet Take 1 tablet (500 mg) by mouth daily. 30 tablet 1     Facility-Administered Medications Prior to Visit   Medication Dose Route Frequency Provider Last Rate Last Admin    influenza quadrivalent adjuvanted vaccine (Fluad) injection 0.5 mL  0.5 mL Intramuscular Once Piehl, Lauro Regulus, MD            PSH:   Past Surgical History:   Procedure Laterality Date    Tonsilectomy      Childhood    URETHRAL DILATION/DVIU          Family History:  family history includes Heart Attack (age of onset: 33) in her sister.    Social History:  Social History     Socioeconomic History     Marital status: Married     Spouse name: Not on file    Number of children: Not on file    Years of education: Not on file    Highest education level: Not on file   Occupational History    Not on file   Tobacco Use    Smoking status: Former    Smokeless tobacco: Never   Substance and Sexual Activity    Alcohol use: Yes     Alcohol/week: 1.0 standard drink     Types: 1 Glasses of wine per week     Comment: occ.    Drug use: No    Sexual activity: Yes     Partners: Male   Other Topics Concern    Not on file   Social History Narrative    Not on file     Social Determinants of Health     Financial Resource Strain: Not on file   Food Insecurity: Not on file   Transportation Needs: Not on file   Physical Activity: Not on file   Stress: Not on file   Social Connections: Not on file   Intimate Partner Violence: Not on file   Housing Stability: Not on file        Physical Exam:   There were no vitals filed for this visit.     General: The patient is 71 year old year old female White  with constitutional symptom: appears age stated.    Psychiatric/mood: normal mood and affect.    Neurological: alert and oriented x3.    Vascular:   LEFT:  Dorsalis pedis pulse is palpable.  Posterior tibial pulse is palpable. Capillary filling time note to be immediate  on the distal hallux.      Neurologic:  LEFT:  The sensation is intact on the foot and ankle.  Motor coordination is intact with normal muscle tone.  There is no manifestations of pathological reflexes noted.      Dermatological:   LEFT:  Upon inspection, the skin note to have normal texture/tone/turgor.  There  is no ulceration/open wound or dermatosis present.  Integument coloration is within normal range and temperature is within normal range.  The hair growth is normal.      Musculoskeletal:   LEFT:  There is mild pain on palpation along the lateral gutter of the ankle joint and medial malleolus.  There is no swelling noted over the affect region. There is no  crepitation on ROM on the affected area.                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  Discussion included spectrum of treatments/diagnostic test available for this condition that includes   continue current care, altering activities, cortisone injection and surgery.   After the discussion, the patient elected continue current care and gradually increase her activity with ASO ankle brace.      We have reviewed previous X-ray and MRI finding and detail discussion took place with the patient.      She is minimally symptomatic, we decided not for diagnostic(using dye) and therapeutic injection using dye.      Return appointment: follow up in 2-3 weeks    Vira Browns DPM, FACFAS  Reconstructive Ankle and Foot Surgeon  [facility]

## 2021-03-23 ENCOUNTER — Encounter (INDEPENDENT_AMBULATORY_CARE_PROVIDER_SITE_OTHER): Payer: PPO | Admitting: Podiatrist

## 2021-04-05 ENCOUNTER — Encounter (INDEPENDENT_AMBULATORY_CARE_PROVIDER_SITE_OTHER): Payer: PPO | Admitting: Podiatrist

## 2021-04-08 ENCOUNTER — Encounter (HOSPITAL_BASED_OUTPATIENT_CLINIC_OR_DEPARTMENT_OTHER): Payer: Self-pay

## 2021-04-11 ENCOUNTER — Ambulatory Visit
Admit: 2021-04-11 | Discharge: 2021-04-11 | Disposition: A | Discharge status: Home / Self Care | Payer: PPO | Attending: Diagnostic Radiology | Admitting: Diagnostic Radiology

## 2021-04-11 ENCOUNTER — Ambulatory Visit (HOSPITAL_BASED_OUTPATIENT_CLINIC_OR_DEPARTMENT_OTHER)
Admit: 2021-04-11 | Discharge: 2021-04-11 | Disposition: A | Discharge status: Home / Self Care | Payer: PPO | Source: Home / Self Care

## 2021-04-11 ENCOUNTER — Other Ambulatory Visit (HOSPITAL_BASED_OUTPATIENT_CLINIC_OR_DEPARTMENT_OTHER): Payer: Self-pay | Admitting: Family Medicine

## 2021-04-11 ENCOUNTER — Encounter (HOSPITAL_BASED_OUTPATIENT_CLINIC_OR_DEPARTMENT_OTHER): Payer: Self-pay

## 2021-04-11 DIAGNOSIS — N6081 Other benign mammary dysplasias of right breast: Secondary | ICD-10-CM

## 2021-04-11 DIAGNOSIS — Z09 Encounter for follow-up examination after completed treatment for conditions other than malignant neoplasm: Secondary | ICD-10-CM

## 2021-04-11 DIAGNOSIS — Z1239 Encounter for other screening for malignant neoplasm of breast: Secondary | ICD-10-CM | POA: Insufficient documentation

## 2021-04-11 DIAGNOSIS — N6001 Solitary cyst of right breast: Secondary | ICD-10-CM | POA: Insufficient documentation

## 2021-04-13 LAB — FIRST EXTRA URINE GRAY TOP

## 2021-04-18 ENCOUNTER — Encounter (HOSPITAL_BASED_OUTPATIENT_CLINIC_OR_DEPARTMENT_OTHER): Payer: Self-pay | Admitting: Urology

## 2021-04-19 ENCOUNTER — Other Ambulatory Visit (INDEPENDENT_AMBULATORY_CARE_PROVIDER_SITE_OTHER): Payer: Self-pay | Admitting: Family Medicine

## 2021-04-19 DIAGNOSIS — K219 Gastro-esophageal reflux disease without esophagitis: Secondary | ICD-10-CM

## 2021-04-20 MED ORDER — OMEPRAZOLE 20 MG OR CPDR
DELAYED_RELEASE_CAPSULE | ORAL | 0 refills | Status: DC
Start: 2021-04-20 — End: 2021-07-19

## 2021-04-20 NOTE — Telephone Encounter (Signed)
Patient is due for yearly exam/lab. Last visit 05/05/2020.    Please schedule follow up visit. Thank you for your assistance.

## 2021-04-20 NOTE — Telephone Encounter (Signed)
1st attempt: sent mychart message for patient informing of one time refill and to schedule follow up for future refills.    CCR/PSR: please assist with scheduling follow up appointment when patient returns call.

## 2021-04-22 NOTE — Telephone Encounter (Signed)
Pt read mychart message, closing TE.

## 2021-05-01 ENCOUNTER — Encounter (INDEPENDENT_AMBULATORY_CARE_PROVIDER_SITE_OTHER): Payer: Self-pay | Admitting: Family Medicine

## 2021-05-01 DIAGNOSIS — K130 Diseases of lips: Secondary | ICD-10-CM

## 2021-05-02 NOTE — Telephone Encounter (Signed)
Routing to provider for review and advisement.

## 2021-05-04 MED ORDER — DOXYCYCLINE MONOHYDRATE 100 MG OR CAPS
100.0000 mg | ORAL_CAPSULE | Freq: Two times a day (BID) | ORAL | 0 refills | Status: DC
Start: 2021-05-04 — End: 2021-05-27

## 2021-05-11 ENCOUNTER — Encounter (INDEPENDENT_AMBULATORY_CARE_PROVIDER_SITE_OTHER): Payer: Self-pay | Admitting: Family Medicine

## 2021-05-11 DIAGNOSIS — G8929 Other chronic pain: Secondary | ICD-10-CM

## 2021-05-11 NOTE — Telephone Encounter (Signed)
Patient has reached out to [place] by eCare message requesting a new Physical Therapy referral for this patient's left shoulder injury.  Patient scheduled at [facility]    Patient has not been seen for this previously .  They were last seen in this clinic on 05/05/2020.    Referral is pended routing to provider.      Please send response back to [place] [phone]

## 2021-05-11 NOTE — Telephone Encounter (Signed)
Pt requests PT referral for left shoulder injury-Pt scheduled at [facility]

## 2021-05-12 NOTE — Telephone Encounter (Signed)
signed

## 2021-05-19 ENCOUNTER — Encounter (INDEPENDENT_AMBULATORY_CARE_PROVIDER_SITE_OTHER): Payer: Self-pay | Admitting: Family Medicine

## 2021-05-19 NOTE — Telephone Encounter (Signed)
Routing to Dr Hei to review and advise

## 2021-05-23 ENCOUNTER — Encounter (INDEPENDENT_AMBULATORY_CARE_PROVIDER_SITE_OTHER): Payer: Self-pay | Admitting: Family Medicine

## 2021-05-23 DIAGNOSIS — K130 Diseases of lips: Secondary | ICD-10-CM

## 2021-05-24 ENCOUNTER — Encounter (INDEPENDENT_AMBULATORY_CARE_PROVIDER_SITE_OTHER): Payer: Self-pay

## 2021-05-26 ENCOUNTER — Telehealth (INDEPENDENT_AMBULATORY_CARE_PROVIDER_SITE_OTHER): Payer: Self-pay | Admitting: Family Medicine

## 2021-05-26 NOTE — Telephone Encounter (Signed)
RETURN CALL: Voicemail - Detailed Message      SUBJECT:  Appointment Request     REASON FOR VISIT: Tele Medicare Annual Wellness   PREFERRED DATE/TIME: 8:00 AM or 8:30 AM   ADDITIONAL INFORMATION: Pt has a schedule visit on 06/02/2021 Pt is looking to change the time to the listed times above. CCR unable to locate those times on my end Pt states times were offered.

## 2021-05-27 ENCOUNTER — Telehealth (INDEPENDENT_AMBULATORY_CARE_PROVIDER_SITE_OTHER): Payer: Self-pay | Admitting: Family Medicine

## 2021-05-27 MED ORDER — DOXYCYCLINE MONOHYDRATE 100 MG OR CAPS
100.0000 mg | ORAL_CAPSULE | Freq: Two times a day (BID) | ORAL | 0 refills | Status: DC
Start: 2021-05-27 — End: 2021-06-01

## 2021-05-27 NOTE — Telephone Encounter (Signed)
This medication is not on refill center's protocols:Doxycyline- patient is asking this med for rosacea. But rx last prescribed for lip cellulitis.

## 2021-05-27 NOTE — Telephone Encounter (Signed)
Spoke to patient and rescheduled visit to 06/08/21 at 130 pm. Closing TE

## 2021-05-28 NOTE — Telephone Encounter (Signed)
Route to PCP if she should take it long term or more than 10 days?

## 2021-06-01 MED ORDER — DOXYCYCLINE MONOHYDRATE 100 MG OR CAPS
100.0000 mg | ORAL_CAPSULE | Freq: Every day | ORAL | 3 refills | Status: DC
Start: 2021-06-01 — End: 2022-08-23

## 2021-06-01 NOTE — Addendum Note (Signed)
Addended by: Melrose Nakayama on: 06/01/2021 01:24 PM     Modules accepted: Orders

## 2021-06-02 ENCOUNTER — Telehealth (INDEPENDENT_AMBULATORY_CARE_PROVIDER_SITE_OTHER): Payer: PPO | Admitting: Family Medicine

## 2021-06-08 ENCOUNTER — Encounter (INDEPENDENT_AMBULATORY_CARE_PROVIDER_SITE_OTHER): Payer: Self-pay | Admitting: Family Medicine

## 2021-06-08 ENCOUNTER — Telehealth (INDEPENDENT_AMBULATORY_CARE_PROVIDER_SITE_OTHER): Payer: PPO | Admitting: Family Medicine

## 2021-06-08 DIAGNOSIS — E041 Nontoxic single thyroid nodule: Secondary | ICD-10-CM

## 2021-06-08 DIAGNOSIS — I10 Essential (primary) hypertension: Secondary | ICD-10-CM

## 2021-06-08 DIAGNOSIS — M87076 Idiopathic aseptic necrosis of unspecified foot: Secondary | ICD-10-CM

## 2021-06-08 DIAGNOSIS — Z0000 Encounter for general adult medical examination without abnormal findings: Secondary | ICD-10-CM

## 2021-06-08 NOTE — Progress Notes (Signed)
Distant Site Telemedicine Encounter      I conducted this encounter via secure, live, face-to-face video conference with the patient. I reviewed the risks and benefits of telemedicine as pertinent to this visit and the patient agreed to proceed.    Provider Location: On-site location (clinic, hospital, on-site office)  Patient Location: At home  Present with patient: No one else present     [facility] VISIT    Cassie Contreras is a 71 year old female who presents for an Annual Wellness Visit.   []  Initial   [x]  Subsequent        INFORMATION GATHERING:   The following areas were confirmed with patient/caregiver and/or updated in Epic at this visit:       Past Medical History   Past Surgical History   Family History   Social History    Current medications and supplements (including vitamins and calcium)   Allergies  [x]  All of the above components have been reviewed and updated.     Opioid Use:  []  Reviewed    The information below is up to date at the end of this visit:   Review of functional ability, hearing, fall risk and safety, diet, physical activity, and health habits  (via HRA or direct review with patient or caregiver)     The Health Risk Assessment (HRA) for today's visit was completed:    []  as Patient-Entered Questionnaire via eCare     [x]  as paper HRA form, completed by or with the patient or caregiver     []  in HRA template documentation, completed by staff or provider with patient or caregiver at visit      List of current providers and suppliers:     [x]   See Care Team Section   []   See EHR Encounters for [facility] Providers involved in care   []   Other providers and suppliers outside [facility]:     Depression screening: PHQ-2: 0  PHQ-9 (if done):       EXAM:  There were no vitals taken for this visit.       GAIT:    [x]  Normal, stable, independent    []  Abnormal (describe):    As assessed by:     [x]  Direct Observation     []  Timed Up and Go     []  Other:  COGNITION:    [x]  Intact      []  Abnormal (describe):     As assessed by:      []  Direct Observation     [x]  Brief Cognitive Screen  NECK: supple without palpable adenopathy. No thyroid enlargement or discrete nodule.  LUNGS: The lungs are clear to auscultation.  CV: Regular rate and rhythm. S1 and S2 normal, no murmurs, clicks, gallops or rubs.       11/07/19 MRI RIGHT foot:  IMPRESSION  Chronic appearing fibular sesamoid fragmentation with superimposed bone marrow edema and sclerosis. This appearance could indicate indicate sequela of osteonecrosis or late sesamoiditis.    03/23/17:  Nodule 1: 3.7 x 3.2 x 1.5 cm  Location : isthmus  Composition: cystic solid (greater than90% solid) = 2 points  Echogenicity: hyperechoic/isoechoic = 1 point  Margins: smooth/ill-defined = 0 points  Echogenic foci: macro calcification: 1 point  Internal vascularity: present  Shape: wider than tall = 0 points  ACR-TIRADS score: 4     Impression  =========   Solitary left thyroid nodule:   ACR-TIRADS category 4: 4 to 6 points.  Moderately suspicious. FNA if greater than/equal to 1.5 cm. Follow if greater than/equal to 1.0 cm.       ADVANCE CARE PLANNING (ACP)     Advance care planning:   []  Patient Accepted   [x]  Patient Declined     Check all that apply:   []  Advance directives are on file in his chart   []  Explained & discussed advance directives at this visit   []  Completed advance care planning form(s) at this visit   []  Other Advance Care Planning discussion at this visit (describe):       Time Spent on ACP: [x]  None       []  1-15 minutes      []  16-30 minutes      []  >30 minutes      ASSESSMENT:       Cassie Contreras was seen for his Annual Wellness Visit, including identification of risk factors & conditions that may affect his health and function in the future.     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  advised to use pharmacy BP machine    (E04.1) Thyroid nodule  Plan: Soft Tissue Head And Neck, TSH with         Reflexive Free T4        Due for recheck           Actions at this visit:       Establishing or updating a written schedule of screening and prevention measures recommended and appropriate for for the next 5-10 years   Establishing or updating a list of his risk factors and conditions for which lifestyle or medical interventions are recommended or underway, including mental health risks and conditions, and including risks/benefits of treatment   Furnishing personalized health advice and, as appropriate, referrals to health education or preventive counseling services or programs (such as fall prevention, tobacco cessation, physical activity, nutrition, weight loss)    [x]  All of the above components have been reviewed and updated.     COUNSELING:      Based on today's evaluation, I recommend the following ways to improve your health or functioning.    You have the following risk factors and/or medical conditions for which there are recommended ways (included in this list) to help you stay as healthy as possible:  1. Stay active  2. Get thyroid ultrasound and labs at [facility]   3. Get blood pressure reading    Here are screening & prevention measures recommended for you:  Health Maintenance   Topic Date Due   . Zoster Vaccine (2 of 3) 10/04/2019   . Medicare Annual Wellness Visit  05/05/2021   . Influenza Vaccine (Season Ended) 10/23/2021   . DTaP, Tdap, and Td Vaccines (2 - Td or Tdap) 03/20/2022   . Depression Screening (PHQ-2)  06/08/2022   . Breast Cancer Screening  04/12/2023   . Lipid Disorders Screening  02/19/2024   . Colorectal Cancer Screening  02/23/2025   . Osteoporosis Screening  Completed   . Pneumococcal Vaccine: 65+ years  Completed   .  Hepatitis C Screening  Completed   . Hepatitis B Vaccine  Completed   . COVID-19 Vaccine  Completed   . Hepatitis A Vaccine  Aged Out       Please plan to have a Subsequent Annual Wellness Visit in 1 year.

## 2021-06-13 ENCOUNTER — Encounter (INDEPENDENT_AMBULATORY_CARE_PROVIDER_SITE_OTHER): Payer: Self-pay

## 2021-06-19 ENCOUNTER — Encounter (INDEPENDENT_AMBULATORY_CARE_PROVIDER_SITE_OTHER): Payer: Self-pay | Admitting: Family Medicine

## 2021-06-21 ENCOUNTER — Ambulatory Visit
Admission: RE | Admit: 2021-06-21 | Discharge: 2021-06-21 | Disposition: A | Discharge status: Home / Self Care | Payer: PPO | Attending: Diagnostic Radiology | Admitting: Diagnostic Radiology

## 2021-06-21 ENCOUNTER — Other Ambulatory Visit (INDEPENDENT_AMBULATORY_CARE_PROVIDER_SITE_OTHER): Payer: Self-pay | Admitting: Family Medicine

## 2021-06-21 ENCOUNTER — Other Ambulatory Visit (HOSPITAL_BASED_OUTPATIENT_CLINIC_OR_DEPARTMENT_OTHER): Payer: Self-pay | Admitting: Urology

## 2021-06-21 DIAGNOSIS — N390 Urinary tract infection, site not specified: Secondary | ICD-10-CM

## 2021-06-21 DIAGNOSIS — E041 Nontoxic single thyroid nodule: Secondary | ICD-10-CM

## 2021-06-21 DIAGNOSIS — I10 Essential (primary) hypertension: Secondary | ICD-10-CM

## 2021-06-21 LAB — URINALYSIS WITH REFLEX CULTURE
Bacteria, URN: NONE SEEN
Bilirubin (Qual), URN: NEGATIVE
Epith Cells_Renal/Trans,URN: NEGATIVE /HPF
Epith Cells_Squamous, URN: NEGATIVE /LPF
Glucose Qual, URN: NEGATIVE
Ketones, URN: NEGATIVE
Leukocyte Esterase, URN: NEGATIVE
Nitrite, URN: NEGATIVE
Occult Blood, URN: NEGATIVE
Protein (Alb Semiquant), URN: NEGATIVE
RBC, URN: NEGATIVE /HPF
Specific Gravity, URN: 1.009 g/mL (ref 1.006–1.027)
WBC, URN: NEGATIVE /HPF
pH, URN: 6 (ref 5.0–8.0)

## 2021-06-21 LAB — COMPREHENSIVE METABOLIC PANEL
ALT (GPT): 17 U/L (ref 7–33)
AST (GOT): 21 U/L (ref 9–38)
Albumin: 4.4 g/dL (ref 3.5–5.2)
Alkaline Phosphatase (Total): 102 U/L (ref 38–172)
Anion Gap: 7 (ref 4–12)
Bilirubin (Total): 0.6 mg/dL (ref 0.2–1.3)
Calcium: 9.5 mg/dL (ref 8.9–10.2)
Carbon Dioxide, Total: 29 meq/L (ref 22–32)
Chloride: 97 meq/L — ABNORMAL LOW (ref 98–108)
Creatinine: 0.77 mg/dL (ref 0.38–1.02)
Glucose: 96 mg/dL (ref 62–125)
Potassium: 5 meq/L (ref 3.6–5.2)
Protein (Total): 7.2 g/dL (ref 6.0–8.2)
Sodium: 133 meq/L — ABNORMAL LOW (ref 135–145)
Urea Nitrogen: 16 mg/dL (ref 8–21)
eGFR by CKD-EPI 2021: >60 mL/min/{1.73_m2} (ref >59)

## 2021-06-21 LAB — LIPID PANEL
Cholesterol/HDL Ratio: 2.1
HDL Cholesterol: 100 mg/dL (ref >39)
LDL Cholesterol, NIH Equation: 100 mg/dL (ref <130)
Non-HDL Cholesterol: 112 mg/dL (ref 0–159)
Total Cholesterol: 212 mg/dL — ABNORMAL HIGH (ref <200)
Triglyceride: 70 mg/dL (ref <150)

## 2021-06-21 LAB — TSH WITH REFLEXIVE FREE T4: TSH with Reflexive Free T4: 1.969 u[IU]/mL (ref 0.400–5.000)

## 2021-06-21 NOTE — Telephone Encounter (Signed)
Routing to Dr Hei to review and advise

## 2021-06-22 ENCOUNTER — Telehealth (INDEPENDENT_AMBULATORY_CARE_PROVIDER_SITE_OTHER): Payer: Self-pay | Admitting: Family Medicine

## 2021-06-22 DIAGNOSIS — E041 Nontoxic single thyroid nodule: Secondary | ICD-10-CM

## 2021-06-22 NOTE — Telephone Encounter (Signed)
Please contact patient to schedule FNA given changed thyroid nodule.  Thanks.

## 2021-06-22 NOTE — Telephone Encounter (Signed)
S/w patient re u/s and need for fna. See order detail.   Recently somewhat incrd occ food aspirations triggering coughing. Unlikely result of thyroid nodule size incr.    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  Finding it helpful

## 2021-06-23 ENCOUNTER — Other Ambulatory Visit (INDEPENDENT_AMBULATORY_CARE_PROVIDER_SITE_OTHER): Payer: Self-pay | Admitting: Family Medicine

## 2021-06-23 DIAGNOSIS — F419 Anxiety disorder, unspecified: Secondary | ICD-10-CM

## 2021-06-23 MED ORDER — CLONAZEPAM 0.5 MG OR TABS
0.5000 mg | ORAL_TABLET | Freq: Every day | ORAL | 0 refills | Status: DC | PRN
Start: 2021-06-23 — End: 2021-11-10

## 2021-06-28 ENCOUNTER — Encounter (INDEPENDENT_AMBULATORY_CARE_PROVIDER_SITE_OTHER): Payer: Self-pay | Admitting: Family Medicine

## 2021-06-28 ENCOUNTER — Telehealth (HOSPITAL_BASED_OUTPATIENT_CLINIC_OR_DEPARTMENT_OTHER): Payer: Self-pay | Admitting: Endocrinology

## 2021-06-28 NOTE — Telephone Encounter (Signed)
RETURN CALL: Voicemail - Detailed Message      SUBJECT:  Appointment Request     REASON FOR VISIT: per referral thyroid nodule    PREFERRED DATE/TIME: flexible     ADDITIONAL INFORMATION: CCR noted referral, called clinic, clinic working patients, sending TE per SM.

## 2021-06-29 ENCOUNTER — Encounter (INDEPENDENT_AMBULATORY_CARE_PROVIDER_SITE_OTHER): Payer: Self-pay | Admitting: Family Medicine

## 2021-06-29 NOTE — Telephone Encounter (Signed)
Please help patient schedule

## 2021-06-29 NOTE — Telephone Encounter (Signed)
Routing to Dr [name] for advise- should patient contact any endocrinology clinics that contracted with her insurance for a sooner appt?

## 2021-07-01 ENCOUNTER — Ambulatory Visit: Payer: PPO | Attending: Cytopathology | Admitting: Endocrinology

## 2021-07-01 VITALS — BP 156/76 | HR 94 | Temp 97.3°F

## 2021-07-01 DIAGNOSIS — E041 Nontoxic single thyroid nodule: Secondary | ICD-10-CM | POA: Insufficient documentation

## 2021-07-01 MED ORDER — LIDOCAINE HCL 1 % IJ SOLN
INTRAMUSCULAR | Status: AC
Start: 2021-07-01 — End: 2021-07-01
  Filled 2021-07-01: qty 10

## 2021-07-01 NOTE — Patient Instructions (Signed)
Today you had a fine needle aspiration, or biopsy, of your thyroid.    Some neck discomfort and bruising at the site of the biopsy is expected following the procedure for the next 1-2 days. Over the counter pain medications and ice compresses can be used to relieve discomfort. You may eat, drink, and bathe as usual.  There are no limitations on your activity.  If you notice significant swelling of the biopsy area, or you develop trouble with swallowing or shortness of breath, please contact our clinic for further recommendations.     It can take up to 2 weeks for the results to return. We will contact you by phone or eCare as soon as we are able to review the results. If you have not heard from our clinic after 2 weeks, please contact us.    There are several possible results from the biopsy:  Benign - the risk of cancer with this result is very small. We usually recommend a follow up ultrasound in 12-18 months to monitor for any growth. This can be done with our clinic or with your primary care doctor.     Malignant (cancer) - with this results we will schedule you to meet with one of our thyroid surgeons to discuss the next steps, which nearly always involves thyroid surgery.     Indeterminate (atypia) - these samples do not have features of thyroid cancer, but have some abnormalities, so they cannot be classified as benign. The risk of cancer in this group can be up to 30%. If we collected a sample for additional testing at time of biopsy, we will send this for testing. If we decided not to collect that sample, we will schedule you for a follow up visit in the next 1-2 weeks to discuss options. In some cases, we may recommend a diagnostic surgery.     Non-diagnostic - this means that there are not enough cells in the sample to make a diagnosis. This can happen with cystic (fluid-filled) nodules, or when there is a lot of blood in the sample. Depending on the situation, we may recommend a repeat biopsy, or  continued monitoring with ultrasound.

## 2021-07-01 NOTE — Progress Notes (Unsigned)
Endocrine Consult Note  {[place]  [phone] E&M codes are now billed on either MDM or total time. You can document only what is medically necessary. :999}   Chief Complaint   Patient presents with    Thyroid Nodule       Reason for Referral:  {MR:[id number]}Cassie Contreras is referred by *** for ***.       Subjective: {VT  History items are no longer counted. You can document only relevant history and ROS items  Problem List  Medical Hx  Surgical Hx  Family Hx  Substance & Sexual Hx  Social Documentation  Obstetric Hx   Birth Hx :999}    Cassie Contreras is a 71 year old female who presents on 07/01/2021.    ***    She has *** history of thyroid disease. ***She has *** family history of thyroid disease. ***She has *** required thyroid medication. She has *** risk factors for thyroid cancer. ***She *** compressive symptoms***. She has *** had previous biopsy. ***       Objective: {VT  Vitals Flowsheet  Labs  Imaging :999}   Vitals: BP (!) 156/76   Pulse 94   Temp 36.3 C (Temporal)   SpO2 100%   Physical Exam  ============  Labs:  ***  ============  Imaging:  Thyroid US, 05/2021  Right lobe: 3.9 x 1.7 x 1.1 cm  Echogenicity: normal  Nodules: present but all sub centimeter     Left Lobe: 5.2 x 1.8 x 1.2 cm  Echogenicity: normal  Nodules: present     Nodule 1: 2.6 x 2.4 x 1.7 cm, not measured prior  Location : inferior lateral  Composition: mixed cystic/solid = 1 point  Echogenicity: hyperechoic/isoechoic = 1 point  Echogenic foci: peripheral calcifications = 2 points  ACR-TIRADS score: 4     Isthmus: 1.8 cm  Echogenicity: nodule takes up entire isthmus  Nodules: present/absent     Nodule 1: 3.6 x 3.5 x 2.1 cm, prior: 3.7 x 3.2 x 1.5 cm  Location : left isthmus  Composition: mixed cystic/solid = 1 point  Echogenicity: hyperechoic/isoechoic = 1 point  Echogenic foci: macro calcifications = 1 point  ACR-TIRADS score: 3     Cervical lymph nodes:  Abnormal lateral cervical lymph nodes: absent  Abnormal central  cervical lymph node: absent  ============     Assessment and Plan: {VT  Problem List  Meds & Orders  HM  Care Gaps :999}   {VT  LOS Calculator  Job aid  AMA's MDM grid :999}  {Assessment & Plan:115017}    No follow-ups on file.

## 2021-07-04 ENCOUNTER — Telehealth (INDEPENDENT_AMBULATORY_CARE_PROVIDER_SITE_OTHER): Payer: Self-pay | Admitting: Family Medicine

## 2021-07-04 NOTE — Telephone Encounter (Signed)
May need to switch appointment for [id number] if it's for her husband

## 2021-07-05 NOTE — Telephone Encounter (Signed)
LVM for patient to call back.    CCR- Please send through to FD #[phone] to clarify who the 07/13/21 appt is for.

## 2021-07-06 LAB — CYTOPATHOLOGY NON-GYN

## 2021-07-06 NOTE — Telephone Encounter (Signed)
LVM- 3rd attempt. Closing TE

## 2021-07-07 ENCOUNTER — Encounter (INDEPENDENT_AMBULATORY_CARE_PROVIDER_SITE_OTHER): Payer: PPO | Admitting: Family Medicine

## 2021-07-13 ENCOUNTER — Telehealth (INDEPENDENT_AMBULATORY_CARE_PROVIDER_SITE_OTHER): Payer: PPO | Admitting: Family Medicine

## 2021-07-15 ENCOUNTER — Other Ambulatory Visit (INDEPENDENT_AMBULATORY_CARE_PROVIDER_SITE_OTHER): Payer: Self-pay | Admitting: Family Medicine

## 2021-07-15 DIAGNOSIS — K219 Gastro-esophageal reflux disease without esophagitis: Secondary | ICD-10-CM

## 2021-07-19 MED ORDER — OMEPRAZOLE 20 MG OR CPDR
DELAYED_RELEASE_CAPSULE | ORAL | 2 refills | Status: DC
Start: 2021-07-19 — End: 2022-04-14

## 2021-07-20 NOTE — Telephone Encounter (Signed)
FYI

## 2021-07-25 ENCOUNTER — Encounter (INDEPENDENT_AMBULATORY_CARE_PROVIDER_SITE_OTHER): Payer: Self-pay | Admitting: Family Medicine

## 2021-07-25 NOTE — Telephone Encounter (Signed)
Routing to provider for review and advisement.

## 2021-07-26 ENCOUNTER — Encounter (HOSPITAL_BASED_OUTPATIENT_CLINIC_OR_DEPARTMENT_OTHER): Payer: Self-pay | Admitting: Endocrinology

## 2021-07-26 DIAGNOSIS — E041 Nontoxic single thyroid nodule: Secondary | ICD-10-CM

## 2021-07-27 ENCOUNTER — Telehealth (HOSPITAL_BASED_OUTPATIENT_CLINIC_OR_DEPARTMENT_OTHER): Payer: Self-pay | Admitting: Endocrinology

## 2021-07-27 NOTE — Addendum Note (Signed)
Addended by: Brandon Melnick on: 07/27/2021 02:40 PM     Modules accepted: Orders

## 2021-07-27 NOTE — Telephone Encounter (Signed)
Informed patient appointment at [facility] cancelled as patient seeing Dr Jacqualine Mau at [place].      Onalee Hua

## 2021-08-02 ENCOUNTER — Encounter (INDEPENDENT_AMBULATORY_CARE_PROVIDER_SITE_OTHER): Payer: Self-pay | Admitting: Family Medicine

## 2021-08-02 ENCOUNTER — Telehealth (INDEPENDENT_AMBULATORY_CARE_PROVIDER_SITE_OTHER): Payer: Self-pay | Admitting: Family Medicine

## 2021-08-02 NOTE — Telephone Encounter (Signed)
Cannot triage a patient from another patient chart.

## 2021-08-02 NOTE — Telephone Encounter (Signed)
Routing to Internal RN Triage for review and advisement.    Hi Dr. [name],  Cassie Contreras has Eczema on his lower legs and one sore has developed into a nasty infection.  I'm sending a photo. Does he need to come in? Thank you,  Maily   Attachments   [id number]

## 2021-08-03 NOTE — Telephone Encounter (Signed)
Copied message to pt's chart and forwarded to Dr [name] to review.

## 2021-08-11 ENCOUNTER — Encounter (INDEPENDENT_AMBULATORY_CARE_PROVIDER_SITE_OTHER): Payer: Self-pay | Admitting: Family Medicine

## 2021-08-11 DIAGNOSIS — L719 Rosacea, unspecified: Secondary | ICD-10-CM

## 2021-08-11 NOTE — Telephone Encounter (Signed)
Pt requests a referral to dermatologist for rosacea

## 2021-08-11 NOTE — Telephone Encounter (Signed)
Pended a Dermatology referral regarding rosacea, please complete and sign if approved. Last OV was 06/08/21. [facility] have been booking out far. [facility] is an option if suitable. Outside [facility] can also be other options. Thank you.     Routing to PCP

## 2021-08-16 MED ORDER — CLINDAMYCIN PHOS (TWICE-DAILY) 1 % EX GEL
Freq: Two times a day (BID) | CUTANEOUS | 1 refills | Status: DC
Start: 2021-08-16 — End: 2022-01-05

## 2021-08-29 ENCOUNTER — Ambulatory Visit (INDEPENDENT_AMBULATORY_CARE_PROVIDER_SITE_OTHER): Payer: PPO | Admitting: Hand Surgery

## 2021-09-11 NOTE — Progress Notes (Signed)
Chief Complaint   Patient presents with    New Patient     Bilateral hand pain, loss of grip R>L       Cassie Contreras is a 71 year old RHD female who is retired.  She presents with a chief complaint of weakness in the right hand that has been ongoing for 10 years. She started to notice the radial deviation of her right thumb more recently. She denies any injury. She has occasional numbness in her fingertips, but nothing consistent. She has noticed a decrease in her grip strength and her ability to pinch or hold objects with her thumb.      Past Medical History:  Past Medical History:   Diagnosis Date    Abdominal pain     Anxiety     Arthritis     Asthma     Constipation     Depression     GERD (gastroesophageal reflux disease)     Headache     Heart palpitations     History of irritable bowel syndrome     Hormone disorder     Metatarsal stress fracture of right foot     TX FROM PAPER CHART      Primary insomnia     Squamous cell carcinoma in situ of skin     Thyroid disease     UTI (urinary tract infection)        Medications:  Current Outpatient Medications   Medication Sig Dispense Refill    bimatoprost (Latisse) 0.03 % topical solution Apply 1 application topically at bedtime. Apply upper eyelid at base of eyelashes with applicator. Blot excess solution. 3 mL 0    budesonide 180 MCG/ACT inhaler Inhale 1 puff by mouth. (Patient not taking: Reported on 10/28/2020)      clindamycin 1 % gel Apply topically 2 times a day. Apply to face. 30 g 1    clonazePAM 0.5 MG tablet Take 1 tablet (0.5 mg) by mouth daily as needed (anxiety). 20 tablet 0    doxycycline monohydrate 100 MG capsule Take 1 capsule (100 mg) by mouth daily. 90 capsule 3    estradiol 10 MCG vaginal tablet Place 1 tablet (10 mcg) into the vagina 2 times a week. 24 tablet 3    fluocinolone-hydroquinone-tretinoin 0.01-4-0.05 % cream Apply topically. (Patient not taking: Reported on 10/28/2020)      memantine 10 MG tablet Take 1 tablet (10 mg) by mouth  daily. 30 tablet 11    omeprazole 20 MG DR capsule take 1 capsule by mouth once daily ON AN EMPTY STOMACH 90 capsule 2    phenazopyridine (Pyridium) 200 MG tablet Take 1 tablet (200 mg) by mouth 2 times a day after meals. For urinary discomfort. (Patient not taking: Reported on 03/16/2021) 10 tablet 0    traMADol 50 MG tablet Take 1 tablet (50 mg) by mouth every 4 hours as needed for moderate pain or severe pain. 30 tablet 0    tretinoin 0.05 % cream APPLY TO AFFECTED AREA(S) ON FACE AT BEDTIME 20 g 1    valACYclovir 500 MG tablet Take 1 tablet (500 mg) by mouth daily. 30 tablet 1     Current Facility-Administered Medications   Medication Dose Route Frequency Provider Last Rate Last Admin    influenza quadrivalent adjuvanted vaccine (Fluad) injection 0.5 mL  0.5 mL Intramuscular Once Piehl, Lauro Regulus, MD           Allergies:   Review of patient's allergies indicates:  Allergies   Allergen Reactions    Erythromycin Other     Strange smells but no rash or trouble breathing.    Ibuprofen      TX FROM PAPER CHART      Pneumovax [Pneumococcal Polysaccharide Vaccine]      Rash/fever (arm)     Sulfa Antibiotics      TX FROM PAPER CHART         Past Surgical History:  Past Surgical History:   Procedure Laterality Date    Tonsilectomy      Childhood    URETHRAL DILATION/DVIU         Social History:   The patient states her problem is not work related.   She is married and has 1 child.    Social History     Substance and Sexual Activity   Alcohol Use Yes    Alcohol/week: 1.0 standard drink    Types: 1 Glasses of wine per week    Comment: occ.     Social History     Tobacco Use   Smoking Status Former   Smokeless Tobacco Never         ROS / Focused Surgical Past Medical History Review:    MRSA within last year: No  History of TB: No  Diabetes: No  History of Stroke: No   History of Seizure: No   Heart / Lung Problem: No   Obstructive Sleep Apnea: No  Kidney / Liver Problem: No   History of Organ Transplant: No   Prescription  Pain Meds: None  Blood Thinner Meds: None   Body mass index is 23.33 kg/m.      Hand & Upper Extremity Examination    Physical Examination  Gen: Patient is healthy, alert, no distress    Psych: Alert and oriented times 3  Pleasant female. Mood and affect appropriate.    Cardiac: 2+ strong radial pulse with regular rate     Pulmonary: NWB on RA    Skin: warm, normal color and sweat patterns.    Vascular: Fingers warm, pink, with brisk capillary refill    Neurologic: Sensation to light touch grossly intact over the median, radial, and ulnar distributions  RUE: (-) Tinel's (wrist), (-) Durkan's     Musculoskeletal:  No swelling over the right thumb MCP joint.  Nontender over  right thumb CMC and IP joint.  Tender over right thumb MCP joint.  No palpable masses  Full composite flexion/extension all digits  R thumb MCP 0-40 AROM  R thumb MCP joint rests in 35 degrees of radial deviation  R thumb MCP UCL instability, deviation of 50-60 degrees without good endpoint      Studies:  X-rays of the right hand were reviewed and show radial subluxation of thumb proximal phalanx at metacarpal head, c/w UCL injury.       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 No arthritic changes noted on XR at MCP joint.     Plan:  - Reviewed patient's exam and x-rays from today's visit. Discussed that collateral ligament tears are treated differently if it is a partial tear vs a complete tear. Patient has significant radial deviation at her MCP joint, c/w complete tear of the UCL.   - Discussed that this type of injury will not heal with conservative measures. Discussed surgical procedure and likely recovery course with patient.     The risks and benefits of surgery were explained  to the patient including potential complications that include but are not limited to bleeding, infection, wound problems, neurovascular injury, nonunion, malunion, hardware failure, stiffness, swelling, posttraumatic arthritis,   and possible need for revision surgery. Informed consent was obtained. Patient will work with our surgical schedulers to arrange a date for surgery.    - [facility] packet filled out, consents signed: R Dealer with internal bracing   - Labs: None  - PDMP Reviewed: No concerns    - OT for hand-based thumb spica orthosis     Azucena Fallen, MD  Professor, Dept of Orthopaedics and Sports Medicine  [facility]  Hand, Wrist, and Elbow Surgery    I, Margarito Liner, scribe, attest that this documentation has been prepared under the direction and in the presence of Dr. Azucena Fallen, MD.        I, Dr. Azucena Fallen, MD, personally performed the services described in this documentation. All medical record entries made by the scribe were at my direction and in my presence. I have reviewed the chart and agree that the record reflects my personal performance and is accurate and complete.

## 2021-09-12 ENCOUNTER — Encounter (HOSPITAL_BASED_OUTPATIENT_CLINIC_OR_DEPARTMENT_OTHER): Payer: Self-pay

## 2021-09-12 ENCOUNTER — Ambulatory Visit
Admission: RE | Admit: 2021-09-12 | Discharge: 2021-09-12 | Disposition: A | Discharge status: Home / Self Care | Payer: PPO | Source: Ambulatory Visit | Attending: Diagnostic Radiology | Admitting: Diagnostic Radiology

## 2021-09-12 ENCOUNTER — Ambulatory Visit (INDEPENDENT_AMBULATORY_CARE_PROVIDER_SITE_OTHER): Payer: PPO | Admitting: Hand Surgery

## 2021-09-12 ENCOUNTER — Encounter (INDEPENDENT_AMBULATORY_CARE_PROVIDER_SITE_OTHER): Payer: Self-pay | Admitting: Hand Surgery

## 2021-09-12 ENCOUNTER — Ambulatory Visit: Payer: PPO | Attending: Orthopedic Surgery | Admitting: Rehabilitative and Restorative Service Providers"

## 2021-09-12 VITALS — BP 141/81 | HR 67 | Temp 97.7°F | Ht 69.0 in | Wt 158.0 lb

## 2021-09-12 DIAGNOSIS — M19041 Primary osteoarthritis, right hand: Secondary | ICD-10-CM | POA: Insufficient documentation

## 2021-09-12 DIAGNOSIS — M79641 Pain in right hand: Secondary | ICD-10-CM

## 2021-09-12 DIAGNOSIS — M79642 Pain in left hand: Secondary | ICD-10-CM

## 2021-09-12 DIAGNOSIS — G8929 Other chronic pain: Secondary | ICD-10-CM

## 2021-09-12 DIAGNOSIS — M19042 Primary osteoarthritis, left hand: Secondary | ICD-10-CM | POA: Insufficient documentation

## 2021-09-12 DIAGNOSIS — S63641A Sprain of metacarpophalangeal joint of right thumb, initial encounter: Secondary | ICD-10-CM

## 2021-09-12 NOTE — Progress Notes (Signed)
OCCUPATIONAL THERAPY INITIAL EVALUATION    This note serves as a CMS evaluation form that requires the referring provider to certify the need for therapy services furnished under this Plan of Treatment. The signing provider is certifying the plan of care.    Certification from 09/12/21 through 12/11/21    GENERAL VISIT INFO    Diagnosis: 71 year old year old female with R thumb UCL rupture    Therapy diagnosis: right thumb pain and instability     Precautions: none    History: Patient is a 71 year old right hand dominant female who was seen today in hand clinic by Stevenson Clinch, PA-C and was diagnosed with a right thumb UCL rupture, and patient is unknown when injury occurred but has been going on for several years. Patient is pending surgery for repair. She previously had orthosis fabrication and an outside facility about 10 years ago, but her puppy chewed the orthosis beyond repair. 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 She cares for her husband who is having memory issues, and has her daughter and son-in-law who live nearby for assistance as needed.     Encounter Diagnoses   Name Primary?    Rupture of ulnar collateral ligament of right thumb, initial encounter Yes    Right hand pain      Referring Provider: Gardiner Rhyme, PA-C   Referring Provider NPI: [id number]  Reason for referral/Medical necessity: Please evaluate and treat, including: hand-based thumb spica orthosi     VISIT COUNT   1    EVALUATION    General Assessment  Certification From*: 09/12/21  Certification To*: 12/11/21  Date of Symptom Onset*: 09/12/21 (date of referral)  Start of Care Date*: 09/12/21  Reason for Referral*: 72 year old year old female with R thumb UCL rupture.  Pain Score: 5 - Moderate pain  Patient Outcome Tool: QuickDash  Outcome Score: 40.9  Body Part/Category: right hand  Have you fallen in the past year?: No  Do you worry about falling?: No  Do you feel unsteady when standing or walking?: No  Fall Screen Date:  09/12/21  Interpreter Status: Not Needed  Based on screening, patient's fall risk is normal.    Prior Level of Function  Lives With: Spouse  Vocational: Retired    Past Medical History:   Diagnosis Date    Abdominal pain     Anxiety     Arthritis     Asthma     Constipation     Depression     GERD (gastroesophageal reflux disease)     Headache     Heart palpitations     History of irritable bowel syndrome     Hormone disorder     Metatarsal stress fracture of right foot     TX FROM PAPER CHART      Primary insomnia     Squamous cell carcinoma in situ of skin     Thyroid disease     UTI (urinary tract infection)        Past Surgical History:   Procedure Laterality Date    Tonsilectomy      Childhood    URETHRAL DILATION/DVIU         Social History     Social History Narrative    Not on file         ASSESSMENT    Previous Interventions: none    Barriers to learning: none    Preferred learning style: demonstration, written, and verbal  Rehabilitation potential is: Excellent    Assessment: Patient is a 71 year old female who is presenting with pain and instability of her right thumb related to a right thumb MCP joint UCL rupture, and is pending surgery. Patient was fit with a custom thermoplastic hand-based thumb spica orthosis for protection and stability today, and patient will bring in for assessment and fitting at her 2-week post-op appointment. Patient will follow up with OT in the meantime as needs arise.       TREATMENT AND PLAN    Pain: The patient is reporting 5/10 level of pain, on a 0-10 Numeric Pain Distress Scale.     Skilled services/Interventions provided: Patient seen today following appointment with Dr. Cristela Felt in Hand Clinic. History taken with evaluation of motion, edema, symptoms and function. Fabricated custom thermoplastic right hand-based thumb spica orthosis for protection and stability of her right thumb due to MCP joint UCL rupture. Patient was instructed to wear as needed for protection  and comfort, and to bring in to her 2-week post-op appointment for assessment and fitting. Patient verbalized understanding of all instructions today, and agreed with plan.     Plan of Care:  Frequency: as needed, follow up post-op    Long-term functional goals: Patient able to open new/tight jar lids, paint without difficulty by 12/11/21.    These goals were discussed and the patient agrees with them.    Patient Education: verbal instructions and demonstrations were provided for the above exercises. The patient was able to communicate understanding of the instructions and precautions.    Interventions/Treatment provided:  Custom orthosis fabrication (HFO rigid w/o joints [id number]) - Minutes: 30       Interventions for next therapy session: Follow up as needed, and post-operatively.        Shown Dissinger K, MOT, OTR/L CHT

## 2021-09-12 NOTE — Progress Notes (Signed)
Patient intends to undergo R thumb UCL reconstruction with internal bracing on date TBD    Present for teaching: Patient   An interpreter was not needed for the visit.    TOPICS TAUGHT:   (X) Medications to Avoid Before Surgery  (X) Pain Management  (X) Constipation After Your Operation  (X) General Surgery Experience   (X) Information About Your Health Care  (X) Recovering at Home Following Anesthesia.  (X) [place] Location and Floor Map  (X) Skin Prep    Patient was instructed not to shave any part of the body for 48 hours prior to surgery. Instructed not to use make-up, deodorant, lotions, hair products, or fragrances on the day of surgery.  Patient was instructed to use Antibacterial Soap to shower from the neck down both the night before surgery and again the morning of surgery prior to coming to the hospital.  Surgical Soap provided.  Patient was instructed to be NPO after midnight the night before surgery.  Patient was instructed to leave valuables at home.    Patient was instructed to go to nearest ER if experiencing life-threatening signs and symptoms. Patient instructed to have a responsible person as an escort home after discharge from hospital after surgery, also to have a responsible adult present with her for the first 24 hours at home. Patient's escort will be TBD    Patient was provided phone numbers to call for questions or concerns and understands reasons to phone.    TEACHING METHOD(S) USED:  (X) One-on one teaching  (X) Written materials    RESPONSE TO TEACHING/OUTCOMES:  (X) Voiced understanding  (X) Repeated back  Further instruction/reinforcement needed: NO      Multiple questions asked and answered  Patient agrees and confirms this surgical plan.

## 2021-09-13 ENCOUNTER — Telehealth (INDEPENDENT_AMBULATORY_CARE_PROVIDER_SITE_OTHER): Payer: Self-pay | Admitting: Hand Surgery

## 2021-09-13 NOTE — Telephone Encounter (Signed)
RETURN CALL: Voicemail - Detailed Message      SUBJECT:  Appointment Request     REASON FOR VISIT: Surgery     PREFERRED DATE/TIME: Open, preferably Tuesday morning    REASON UNABLE TO APPOINT: [facility] unable to appoint for surgery. Please contact patient to schedule.

## 2021-09-13 NOTE — Telephone Encounter (Signed)
Re-routing to the PCC's.

## 2021-09-14 ENCOUNTER — Encounter (INDEPENDENT_AMBULATORY_CARE_PROVIDER_SITE_OTHER): Payer: Self-pay | Admitting: Hand Surgery

## 2021-09-19 NOTE — Telephone Encounter (Signed)
Left a detailed message for the patient to contact me directly to schedule surgery with Dr Huang.    Cassie Contreras  Surgical Patient Care Coordinator  Orthopaedic Hand Service   [facility]  [facility]  [address]  [id number]  [place] [address]    OFFICE:   [phone]    FAX:   [phone]

## 2021-09-19 NOTE — Telephone Encounter (Signed)
Left a detailed message for the patient to contact me directly to schedule surgery with Dr Huang.    Cassie Contreras  Surgical Patient Care Coordinator  Orthopaedic Hand Service   [facility]   [facility]  [address]  [id number]  [place] [address]    OFFICE:   [phone]    FAX:   [phone]

## 2021-10-06 NOTE — Telephone Encounter (Signed)
Situation:  Called and spoke to patient.   Requesting to have a MRI of right hand and wrist because of increased pain  Background:  Patient pre oped for  Right thumb UCL repair with internal bracing  Assessment:   Informed patient that the increased pain is probably related to the need of surgery soon. Xrays were performed at clinic visit. DOS 11/10/21  Recommendation:   Informed patient that she will probably not have an MRI prior to surgery. However, will forward to Dr Renaldo Reel and PA to advise.    Ludger Nutting, RN  [facility]

## 2021-10-12 ENCOUNTER — Encounter (INDEPENDENT_AMBULATORY_CARE_PROVIDER_SITE_OTHER): Payer: Self-pay | Admitting: Family Medicine

## 2021-10-13 NOTE — Telephone Encounter (Signed)
Routing to provider for review and advisement.

## 2021-10-18 ENCOUNTER — Encounter (INDEPENDENT_AMBULATORY_CARE_PROVIDER_SITE_OTHER): Payer: PPO | Admitting: Medical-Surgical

## 2021-10-19 NOTE — Telephone Encounter (Signed)
S/w patient re UCL tear, Pathophysiology, work-up and treatment rationale explained in detail. She feels comfortable proceeding

## 2021-11-04 ENCOUNTER — Encounter (HOSPITAL_BASED_OUTPATIENT_CLINIC_OR_DEPARTMENT_OTHER): Payer: Self-pay | Admitting: Hand Surgery

## 2021-11-07 ENCOUNTER — Encounter (HOSPITAL_COMMUNITY): Payer: Self-pay | Admitting: Unknown Physician Specialty

## 2021-11-07 NOTE — Anesthesia Preprocedure Evaluation (Addendum)
Patient: Cassie Contreras  Procedure Information       Date/Time: 11/10/21 1102    Procedure: Right Thumb Ulnar Collateral Ligament Repair, Possible Internal Bracing (Right: Hand)    Location: [facility]    Surgeons: Alinda Dooms, MD            HPI: 09/12/21, J. Ronalee Red, MD  [name] is a 71 year old RHD female who is retired.  She presents with a chief complaint of weakness in the right hand that has been ongoing for 10 years. She started to notice the radial deviation of her right thumb more recently. She denies any injury. She has occasional numbness in her fingertips, but nothing consistent. She has noticed a decrease in her grip strength and her ability to pinch or hold objects with her thumb.     Relevant Problems   Anesthesia   (+) Obstructive sleep apnea      Pulmonary   (+) Obstructive sleep apnea      Neuro/Psych   (+) Migraine with aura and without status migrainosus, not intractable   (+) New onset of headaches after age 20      Cardio   (+) Benign hypertension      GI/Hepatic/Renal   (+) Recurrent UTI      Hematology   (+) Hormone replacement therapy     Relevant surgical history:   Past Surgical History:   Procedure Laterality Date    Tonsilectomy      Childhood    URETHRAL DILATION/DVIU           Medications:     Outpatient:   Current Outpatient Medications   Medication Instructions    bimatoprost (Latisse) 0.03 % topical solution 1 application , Topical, Nightly, Apply upper eyelid at base of eyelashes with applicator. Blot excess solution.    budesonide 180 MCG/ACT inhaler 1 puff    clindamycin 1 % gel Topical, 2 times daily, Apply to face.    clonazePAM (KLONOPIN) 0.5 mg, Oral, Daily PRN    doxycycline monohydrate (MONODOX) 100 mg, Oral, Daily    estradiol (VAGIFEM) 10 mcg, Vaginal, 2 times weekly    fluocinolone-hydroquinone-tretinoin 0.01-4-0.05 % cream Apply topically.    memantine (NAMENDA) 10 mg, Oral, Daily    omeprazole 20 MG DR capsule take 1  capsule by mouth once daily ON AN EMPTY STOMACH    phenazopyridine (PYRIDIUM) 200 mg, Oral, 2 times daily after meals, For urinary discomfort.    traMADol (ULTRAM) 50 mg, Oral, Every 4 hours PRN    tretinoin 0.05 % cream APPLY TO AFFECTED AREA(S) ON FACE AT BEDTIME    valACYclovir (VALTREX) 500 mg, Oral, Daily                Review of patient's allergies indicates:  Allergies   Allergen Reactions    Erythromycin Other     Strange smells but no rash or trouble breathing.    Ibuprofen      TX FROM PAPER CHART      Pneumovax [Pneumococcal Polysaccharide Vaccine]      Rash/fever (arm)     Sulfa Antibiotics      TX FROM PAPER CHART         Social History:   Social History     Tobacco Use    Smoking status: Former    Smokeless tobacco: Never   Substance Use Topics    Alcohol use: Yes     Alcohol/week: 1.0  standard drink     Types: 1 Glasses of wine per week     Comment: occ.    Drug use: No       Medical History and Review of Systems  Documentation reviewed: Patient health questionnaire and electronic medical record.    Source of information: Chart review.    Functional Status   Able to walk 1 city block (200 yards), able to climb 2 flights of stairs or more without stopping, exercise regularly and able to lay flat and still for 30 minutes.   Functional status comments: Weights, walking, biking 2-3x per week    Pulmonary   (+) sleep apnea    Neuro/Psych   (+) headaches (migraines)  (+) psychiatric history, anxiety    Cardiovascular   (+) hypertension    Musculoskeletal   --Chronic right hand weakness with recently noted radial deviation of of right thumb.     Skin   negative skin ROS    GI/Hepatic/Renal   (+) GERD    Endo/Immunology   neg endo/other ROS    Hematology   --Thyroid nodule, currently undergoing workup.   Oncology   (+) cancer (Hx of skin CA)     09/12/21  BP 141/81 Important       Pulse 67     Temp 36.5 C (Temporal)     Ht 5' 9"  (1.753 m)     Wt 71.7 kg (158 lb)     SpO2 100%     BMI 23.33 kg/m     BSA 1.87  m       Physical Exam  Airway  Mallampati:  IV  Upper Lip Bite Test: III  TM distance:  <6 cm  Neck ROM:  Full  Mouth Opening:  Normal  Facial Hair:  None    Dental  normal      Cardiovascular  normal      Pulmonary  normal           Labs: (last year)    BMP  CBC/Coags   Na 133 (L) 06/21/2021  Hb - -   K 5.0 06/21/2021  HCT - -   Cl 97 (L) 06/21/2021  WBC - -   HCO3 29 06/21/2021  PLT - -   BUN 16 06/21/2021  INR - -   Cr 0.77 06/21/2021  PT - -   Glu 96 06/21/2021  PTT - -       Misc   eGFR >60 06/21/2021  MCV - -   A1C - -  BNP - -       LFTs   AST 21 06/21/2021  Albumin 4.4 06/21/2021   ALT 17 06/21/2021  Protein 7.2 06/21/2021   Alk Phos 102 06/21/2021  T Bili 0.6 06/21/2021         Relevant procedures / diagnostic studies:     Stress Test 06/23/16  Results:   1. Exercise capacity is normal compared to active women of similar age. FAI 0%   2.  Test was terminated because of fatigue.   3.  Exercise causes no chest pain.   4.  Resting blood pressure is normal and response to exercise is normal.   5.  Rhythm during stress test is normal. There are rare PVCs.   6.  After stress, ST segment response is normal.     Risk Calculators / Scores:                 PAT CLINIC DISCUSSION  ANESTHESIA PLAN   Informed Consent:     Anesthesia Plan discussed with:        Patient    ASA Score:     ASA: 3  Planned Anesthetic Type:      regional  Supervising Provider Comments:      ASA 3 - OSA - no CPAP (usual disc), htn, thyroid nodules, Plan IV reg - discussed limitations of sedation due to predictors for airway challenges and reflux - pt amenable

## 2021-11-09 ENCOUNTER — Other Ambulatory Visit: Payer: Self-pay

## 2021-11-09 ENCOUNTER — Ambulatory Visit: Payer: PPO | Admitting: Unknown Physician Specialty

## 2021-11-09 ENCOUNTER — Ambulatory Visit (HOSPITAL_BASED_OUTPATIENT_CLINIC_OR_DEPARTMENT_OTHER): Payer: PPO | Admitting: "Endocrinology

## 2021-11-09 MED FILL — Cefazolin Sodium-Dextrose IV Solution 2 GM/100ML-4%: INTRAVENOUS | Qty: 100 | Status: AC

## 2021-11-10 ENCOUNTER — Encounter (HOSPITAL_BASED_OUTPATIENT_CLINIC_OR_DEPARTMENT_OTHER): Payer: Self-pay | Admitting: Hand Surgery

## 2021-11-10 ENCOUNTER — Ambulatory Visit (HOSPITAL_BASED_OUTPATIENT_CLINIC_OR_DEPARTMENT_OTHER)
Admit: 2021-11-10 | Discharge: 2021-11-10 | Disposition: A | Discharge status: Home / Self Care | Payer: PPO | Source: Home / Self Care | Attending: Hand Surgery | Admitting: Hand Surgery

## 2021-11-10 ENCOUNTER — Other Ambulatory Visit (HOSPITAL_BASED_OUTPATIENT_CLINIC_OR_DEPARTMENT_OTHER): Payer: Self-pay

## 2021-11-10 ENCOUNTER — Ambulatory Visit (HOSPITAL_BASED_OUTPATIENT_CLINIC_OR_DEPARTMENT_OTHER): Payer: PPO | Admitting: Pain Medicine

## 2021-11-10 ENCOUNTER — Encounter (HOSPITAL_BASED_OUTPATIENT_CLINIC_OR_DEPARTMENT_OTHER)
Admission: RE | Disposition: A | Discharge status: Home / Self Care | Payer: Self-pay | Source: Home / Self Care | Attending: Hand Surgery

## 2021-11-10 ENCOUNTER — Ambulatory Visit (HOSPITAL_BASED_OUTPATIENT_CLINIC_OR_DEPARTMENT_OTHER): Payer: Self-pay | Admitting: Hand Surgery

## 2021-11-10 ENCOUNTER — Ambulatory Visit
Admission: RE | Admit: 2021-11-10 | Discharge: 2021-11-10 | Disposition: A | Discharge status: Home / Self Care | Payer: PPO | Attending: Hand Surgery | Admitting: Hand Surgery

## 2021-11-10 ENCOUNTER — Other Ambulatory Visit (HOSPITAL_BASED_OUTPATIENT_CLINIC_OR_DEPARTMENT_OTHER): Payer: Self-pay | Admitting: Hand Surgery

## 2021-11-10 DIAGNOSIS — I10 Essential (primary) hypertension: Secondary | ICD-10-CM | POA: Insufficient documentation

## 2021-11-10 DIAGNOSIS — Z881 Allergy status to other antibiotic agents status: Secondary | ICD-10-CM | POA: Insufficient documentation

## 2021-11-10 DIAGNOSIS — X58XXXA Exposure to other specified factors, initial encounter: Secondary | ICD-10-CM | POA: Insufficient documentation

## 2021-11-10 DIAGNOSIS — S63641A Sprain of metacarpophalangeal joint of right thumb, initial encounter: Secondary | ICD-10-CM

## 2021-11-10 DIAGNOSIS — K219 Gastro-esophageal reflux disease without esophagitis: Secondary | ICD-10-CM | POA: Insufficient documentation

## 2021-11-10 DIAGNOSIS — Z87891 Personal history of nicotine dependence: Secondary | ICD-10-CM | POA: Insufficient documentation

## 2021-11-10 DIAGNOSIS — G4733 Obstructive sleep apnea (adult) (pediatric): Secondary | ICD-10-CM | POA: Insufficient documentation

## 2021-11-10 DIAGNOSIS — Z882 Allergy status to sulfonamides status: Secondary | ICD-10-CM | POA: Insufficient documentation

## 2021-11-10 DIAGNOSIS — S63641D Sprain of metacarpophalangeal joint of right thumb, subsequent encounter: Secondary | ICD-10-CM

## 2021-11-10 DIAGNOSIS — R69 Illness, unspecified: Secondary | ICD-10-CM

## 2021-11-10 SURGERY — REPAIR, LIGAMENT, ULNAR COLLATERAL, THUMB
Anesthesia: Regional | Site: Hand | Laterality: Right | Wound class: Class I/ Clean

## 2021-11-10 MED ORDER — ONDANSETRON HCL 4 MG/2ML IJ SOLN
4.0000 mg | INTRAMUSCULAR | Status: DC | PRN
Start: 2021-11-10 — End: 2021-11-10

## 2021-11-10 MED ORDER — LABETALOL HCL 5 MG/ML IV SOLN
2.5000 mg | INTRAVENOUS | Status: DC | PRN
Start: 2021-11-10 — End: 2021-11-10

## 2021-11-10 MED ORDER — LIDOCAINE HCL (PF) 1 % IJ SOLN
INTRAMUSCULAR | Status: AC
Start: 2021-11-10 — End: 2021-11-10
  Filled 2021-11-10: qty 30

## 2021-11-10 MED ORDER — LACTATED RINGERS IV SOLN
10.0000 mL/h | INTRAVENOUS | Status: DC
Start: 2021-11-10 — End: 2021-11-10

## 2021-11-10 MED ORDER — PROPOFOL 10 MG/ML IV EMUL WRAPPER (OSM ONLY)
INTRAVENOUS | Status: DC | PRN
Start: 2021-11-10 — End: 2021-11-10
  Administered 2021-11-10: 125 ug/kg/min via INTRAVENOUS

## 2021-11-10 MED ORDER — PROPOFOL 500 MG/50ML IV EMUL INFUSION
INTRAVENOUS | Status: AC
Start: 2021-11-10 — End: 2021-11-10
  Filled 2021-11-10: qty 100

## 2021-11-10 MED ORDER — BUPIVACAINE-EPINEPHRINE 0.25% -1:200000 IJ SOLN
INTRAMUSCULAR | Status: DC | PRN
Start: 2021-11-10 — End: 2021-11-10
  Administered 2021-11-10: 20 mL via INTRAMUSCULAR

## 2021-11-10 MED ORDER — ACETAMINOPHEN 325 MG OR TABS
650.0000 mg | ORAL_TABLET | ORAL | Status: DC
Start: 2021-11-10 — End: 2021-11-10

## 2021-11-10 MED ORDER — OXYCODONE HCL 5 MG OR TABS
5.0000 mg | ORAL_TABLET | ORAL | 0 refills | Status: DC | PRN
Start: 2021-11-10 — End: 2021-11-11
  Filled 2021-11-10: qty 20, 2d supply, fill #0

## 2021-11-10 MED ORDER — PROPOFOL 500 MG/50ML IV EMUL INFUSION
INTRAVENOUS | Status: AC
Start: 2021-11-10 — End: 2021-11-10
  Filled 2021-11-10: qty 50

## 2021-11-10 MED ORDER — ACETAMINOPHEN 500 MG OR TABS
ORAL_TABLET | ORAL | Status: AC
Start: 2021-11-10 — End: 2021-11-10
  Filled 2021-11-10: qty 2

## 2021-11-10 MED ORDER — PROPOFOL 10 MG/ML IV EMUL WRAPPER (OSM ONLY)
INTRAVENOUS | Status: DC | PRN
Start: 2021-11-10 — End: 2021-11-10
  Administered 2021-11-10: 20 mg via INTRAVENOUS

## 2021-11-10 MED ORDER — ONDANSETRON HCL 4 MG/2ML IJ SOLN
INTRAMUSCULAR | Status: AC
Start: 2021-11-10 — End: 2021-11-10
  Filled 2021-11-10: qty 2

## 2021-11-10 MED ORDER — ONDANSETRON HCL 4 MG/2ML IJ SOLN
INTRAMUSCULAR | Status: DC | PRN
Start: 2021-11-10 — End: 2021-11-10
  Administered 2021-11-10: 4 mg via INTRAVENOUS

## 2021-11-10 MED ORDER — CEFAZOLIN SODIUM-DEXTROSE 2-4 GM/100ML-% IV SOLN
2.0000 g | INTRAVENOUS | Status: AC
Start: 2021-11-10 — End: 2021-11-10
  Administered 2021-11-10: 2 g via INTRAVENOUS
  Filled 2021-11-10: qty 100

## 2021-11-10 MED ORDER — ACETAMINOPHEN 500 MG OR TABS
1000.0000 mg | ORAL_TABLET | ORAL | Status: AC
Start: 2021-11-10 — End: 2021-11-10
  Administered 2021-11-10: 1000 mg via ORAL

## 2021-11-10 MED ORDER — FENTANYL CITRATE (PF) 100 MCG/2ML IJ SOLN
INTRAMUSCULAR | Status: AC
Start: 2021-11-10 — End: 2021-11-10
  Filled 2021-11-10: qty 2

## 2021-11-10 MED ORDER — FENTANYL CITRATE (PF) 50 MCG/ML IJ SOLN WRAPPER (ANESTHESIA OSM ONLY)
INTRAMUSCULAR | Status: DC | PRN
Start: 2021-11-10 — End: 2021-11-10
  Administered 2021-11-10 (×2): 25 ug via INTRAVENOUS

## 2021-11-10 MED ORDER — LIDOCAINE HCL (PF) 1 % IJ SOLN
INTRAMUSCULAR | Status: DC | PRN
Start: 2021-11-10 — End: 2021-11-10
  Administered 2021-11-10: 14 mL via INTRAVENOUS

## 2021-11-10 MED ORDER — MIDAZOLAM HCL (PF) 2 MG/2ML IJ SOLN
INTRAMUSCULAR | Status: AC
Start: 2021-11-10 — End: 2021-11-10
  Filled 2021-11-10: qty 2

## 2021-11-10 MED ORDER — ONDANSETRON HCL 4 MG OR TABS
4.0000 mg | ORAL_TABLET | Freq: Three times a day (TID) | ORAL | 0 refills | Status: DC | PRN
Start: 2021-11-10 — End: 2022-03-09
  Filled 2021-11-10: qty 20, 7d supply, fill #0

## 2021-11-10 MED ORDER — LIDOCAINE 4 % EX CREA
TOPICAL_CREAM | CUTANEOUS | Status: DC | PRN
Start: 2021-11-10 — End: 2021-11-10

## 2021-11-10 MED ORDER — NALOXONE HCL 0.4 MG/ML IJ SOLN
0.0400 mg | INTRAMUSCULAR | Status: DC | PRN
Start: 2021-11-10 — End: 2021-11-10

## 2021-11-10 MED ORDER — ACETAMINOPHEN 500 MG OR TABS
1000.0000 mg | ORAL_TABLET | Freq: Three times a day (TID) | ORAL | 3 refills | Status: DC | PRN
Start: 2021-11-10 — End: 2022-03-09
  Filled 2021-11-10: qty 50, 9d supply, fill #0

## 2021-11-10 MED ORDER — OXYCODONE HCL 5 MG OR TABS
5.0000 mg | ORAL_TABLET | ORAL | Status: DC | PRN
Start: 2021-11-10 — End: 2021-11-10

## 2021-11-10 MED ORDER — FENTANYL CITRATE (PF) 100 MCG/2ML IJ SOLN
25.0000 ug | INTRAMUSCULAR | Status: DC | PRN
Start: 2021-11-10 — End: 2021-11-10

## 2021-11-10 MED ORDER — SENNOSIDES 8.6 MG OR TABS
8.6000 mg | ORAL_TABLET | Freq: Two times a day (BID) | ORAL | 0 refills | Status: DC
Start: 2021-11-10 — End: 2022-03-09
  Filled 2021-11-10: qty 20, 10d supply, fill #0

## 2021-11-10 SURGICAL SUPPLY — 7 items
ANCHOR SUTURE CORKSCREW 2-0 2.2MM 4MM 2NEEDLE ×1 IMPLANT
BAND BAG 36INX30IN (Other) ×1 IMPLANT
GLOVE SURG 7.5 BIOGEL MICRO INDICATOR PF (Glove) ×1 IMPLANT
GLOVE SURG BIOGEL 7 ULTRATOUCH PF (Glove) ×1 IMPLANT
PACK HAND ROOSEVELT (Pack) ×1 IMPLANT
SUTURE MONOSOF 4-0 P-12 18IN (Suture) ×1 IMPLANT
TOWEL OR DISPOSABLE STERILE BLUE 6/PK (Towel) ×1 IMPLANT

## 2021-11-10 NOTE — Op Note (Signed)
Orthopedic Surgery Operative Note   Cassie Contreras - DOB: December 16, 1950 71 year old female) MRN: [id number]  Procedure Date: 11/10/2021 Location:[facility]       Preoperative Diagnosis:   Rupture of right thumb ulnar collateral ligament [H85.277O]    Post Operative Diagnosis:   Rupture of right thumb ulnar collateral ligament [E42.353I]    Procedures Performed:  Right Thumb Ulnar Collateral Ligament Repair    Surgeons:  Primary: Vita Erm, MD  Resident - Assisting: Ernst Spell, MD    Anesthesia:  Duwaine Maxin    EBL: minimal    Specimens: None    CLINICAL NOTE       The patient is a 71 year old female who presents to clinic with pain and weakness over the right thumb MCP joint for 10+ years that has worsened recently. Patient does not recall a specific injury. On exam, patient has ulnar laxity on radial deviation over the MCP joint c/w complete rupture of the UCL. The risks and benefits of surgery were explained to the patient in great detail including alternative treatment options that include nonoperative treatment.  Potential complications include but are not limited to bleeding, infection, wound problems, neurovascular injury, stiffness, persistent pain, recurrent instability, and possible revision surgery. Patient understands the above and would like to proceed with surgery. Informed consent was obtained.    DESCRIPTION     The patient was identified in the preoperative holding area, and the right upper extremity was marked appropriately.  The patient was taken to the operating room and placed supine on the operating table with a hand table.  Preoperatively, patient was given IV antibiotics.  A well-padded tourniquet was placed on the patient's arm. A Bier block was performed by the anesthesiology service without difficulty, with tourniquet set at 250 mm Hg. A time-out was taken at this time to verify patient identification as well as the planned procedure.  The right upper extremity was  prepped and draped in the usual sterile fashion.      A curvilinear incision was made along the ulnar aspect of the right thumb MCP joint. The incision was taken through skin and subcutaneous tissue followed by blunt dissection with tenotomy scissors.  Branches of the radial sensory nerve were dissected free and protected.  There was injury to the adductor aponeurosis. We incised the adductor aponeurosis longitudinally.  Patient had a complete rupture of the thumb UCL off its insertion on the base of the proximal phalanx. The UCL was folded on itself.  We debrided the footprint with rongeur.  We then placed an Arthrex 2.2 mm corkscrew suture anchor at the base of the proximal phalanx. The thumb UCL was repaired down to the footprint using 2-0 Fiberwire sutures from the anchor.  We had excellent repair of the UCL down to its insertion.  The thumb was stressed with good radial or ulnar stability over the MCP joint. Fluoroscopic images were obtained to confirm hardware placement and concentricity of the joint.  The skin and subcutaneous tissue was infitrated with 0.25% Marcaine with epinephrine.  The adductor aponeurosis was imbricated using the 2-0 Fiberwire from the UCL repair. .  The skin incision was reapproximated with 4-0 Monocryl deep subcutaneous sutures followed by 4-0 running Monocryl subcuticular suture.  The wound was dressed with Steri-strips, Xeroform followed by fluffs and a sterile Webril.  The patient was placed into a thumb spica plaster splint with the IP joint free. The patient tolerated the procedure well and was  transferred to the recovery room in stable condition.    Follow-up in OT in 2 weeks for splint removal, wound care, and suture removal as well as referral to OT for thumb spica thermoplast orthosis and starting active ROM. No passive ROM for 6 weeks.    X-rays 3-views R thumb at clinic visit.    ATTENDING STATEMENT:   I was present and scrubbed for all critical portions of the procedure  and I was immediately available for other portions.     Complications: None    Condition: Stable

## 2021-11-10 NOTE — H&P (Signed)
History and Physical     Cassie Contreras [facility]") - DOB: 06/03/1950 (71 year old female)  Gender Identity: Female  Preferred Pronouns: she/her/hers  PCP: Cassie Nakayama, MD   Code Status: No Order       IO:XBDZHGD encounter Rupture of ulnar collateral ligament of right thumb     SUBJECTIVE   Cassie Contreras is a 71 year old female with Rupture of ulnar collateral ligament of right thumb, initial encounter [J24.268T]. She was seen recently in clinic, where a detailed review of HPI can be found.  She was noted to benefit from Right Thumb Ulnar Collateral Ligament Repair, Possible Internal Bracing - Right.       OBJECTIVE        T: 36.2 C (11/10/21 0828)  BP: (!) 156/69 (11/10/21 0828)  HR: 83 (11/10/21 0828)  RR: 18 (11/10/21 0828)  SpO2: 99 % (11/10/21 0828) Room air   Vitals (Most recent in last 24 hrs)   T: 36.2 C (11/10/21 0828)  BP: (!) 156/69 (11/10/21 0828)  HR: 83 (11/10/21 0828)  RR: 18 (11/10/21 0828)  SpO2: 99 % (11/10/21 0828) Room air  T range: Temp  Min: 36.2 C  Max: 36.2 C  Wt 150 lb (68.04 kg)     Ht 5' 8.5" (1.74 m)     Body mass index is 22.48 kg/m.       General:  Well developed, appearing stated age and in no acute distress  Mental Status:A&O x 3  Cardiovascular: 2 + radial pulse   Lungs:   nl resp effort  Surgical Site: right thumb instability with radial deviation     Labs (last 24 hours):   Chemistries  CBC  LFT  Gases, other   - - - -   -   AST: - ALT: -  -/-/-/-  -/-/-/-   - - -   - >< -  AP: - T bili: -  Lact (a): - Lact (v): -   eGFR: - Ca: -   -   Prot: - Alb: -  Trop I: - D-dimer: -   Mg: - PO4: -  ANC: -     BNP: - Anti-Xa: -     ALC: -    INR: -         ASSESSMENT/PLAN    Cassie Contreras is a 71 year old female with Rupture of ulnar collateral ligament of right thumb, initial encounter [M19.622W], who presents for Right Thumb Ulnar Collateral Ligament Repair, Possible Internal Bracing - Right. Office-obtained consent is accurate; further questions about the procedure(s) were  answered..  Peri-operative prophylactic antibiotics ordered. Antibiotics ordered include:ceFAZolin in dextrose (iso-osmotic) - 2-4 GM/100ML-%  doxycycline monohydrate - 100 MG.     Loyal Jacobson, MD

## 2021-11-10 NOTE — Anesthesia Postprocedure Evaluation (Signed)
Patient: Cassie Contreras    Procedure Summary:   Date: 11/10/2021  Room/Location: [facility] [place]    Anesthesia Start: 10:11 AM  Anesthesia Stop: 11:06 AM    Procedure(s):  Right Thumb Ulnar Collateral Ligament Repair, Internal Bracing  Post-op Diagnosis     * Rupture of ulnar collateral ligament of right thumb, initial encounter [D14.970Y]    Responsible Provider:   Beulah Gandy, MD  ASA Status: 3     Vitals Value Taken Time   BP 107/55 11/10/21 1128   Temp 36.5 C 11/10/21 1128   Pulse 82 11/10/21 1129   SpO2 95 % 11/10/21 1129   Vitals shown include unvalidated device data.    Place of evaluation: PACU    Patient participation: patient participated    Level of consciousness: fully conscious    Patient pain control satisfaction: patient is satisfied with level of pain control    Airway patency: patent    Cardiovascular status during assessment: stable    Respiratory status during assessment: breathing comfortably    Anesthetic complications: no    Intravascular volume status assessment: euvolemic    Nausea / vomiting: patient is not experiencing nausea      Planned post-operative disposition at time of assessment: hospital discharge

## 2021-11-10 NOTE — Procedure Nursing Note (Signed)
PACU Summary, Outpatient Discharge:    Patient summary/synopsis  Level of consciousness: awake, alert, and oriented  Nausea:None  Dressing(s) are: clean, dry, and intact  Pain is present: Yes 2/10. Pain is tolerable or patient has good function: Yes  Oral fluids taken: Yes  Voiding Status: Declined, denied need to void prior to discharge; educated to call if unable to void  Precautions or restrictions: Fall and Weight bearing restrictions    Home with: Written instructions.   Escort present:Yes

## 2021-11-10 NOTE — Discharge Instructions (Signed)
Discharge Instructions: After Your Surgery  You've just had surgery. During surgery, you were given medicine called anesthesia to keep you relaxed and free of pain. After surgery, you may have some pain or nausea. This is common. Here are some tips for feeling better and getting well after surgery.     Stay on schedule with your medicine.   Going home  Your healthcare provider will show you how to take care of yourself when you go home. He or she will also answer your questions. Have an adult family member or friend drive you home. For the first 24 hours after your surgery:   Don't drive or use heavy equipment.   Don't make important decisions or sign legal papers.   Don't drink alcohol.   Have someone stay with you, if needed. He or she can watch for problems and help keep you safe.  Be sure to go to all follow-up visits with your healthcare provider. And rest after your surgery for as long as your healthcare provider tells you to.  Coping with pain  If you have pain after surgery, pain medicine will help you feel better. Take it as told, before pain becomes severe. Also, ask your healthcare provider or pharmacist about other ways to control pain. This might be with heat, ice, or relaxation. And follow any other instructions your surgeon or nurse gives you.  Tips for taking pain medicine  To get the best relief possible, remember these points:   Pain medicines can upset your stomach. Taking them with a little food may help.   Most pain relievers taken by mouth need at least 20 to 30 minutes to start to work.   Don't wait till your pain becomes severe before you take your medicine. Try to time your medicine so that you can take it before starting an activity. This might be before you get dressed, go for a walk, or sit down for dinner.   Constipation is a common side effect of pain medicines. Call your healthcare provider before taking any medicines such as laxatives or stool softeners to help ease  constipation. Also ask if you should skip any foods. Drinkinglots of fluids andeating foodssuch as fruits and vegetables that are high in fiber can also help. Remember, don't take laxatives unless your surgeon has prescribed them.   Drinking alcohol and taking pain medicine can cause dizziness and slow your breathing. It can even be deadly. Don't drink alcohol while taking pain medicine.   Pain medicine can make you react more slowly to things. Don't drive or run machinery while taking pain medicine.  Your healthcare providermay tell you to take acetaminophen to help ease your pain. Ask him or her how much you are supposed to take each day. Acetaminophen or other pain relievers may interact with your prescription medicines or other over-the-counter (OTC) medicines. Some prescription medicines have acetaminophen and other ingredients.Using both prescription and OTC acetaminophenfor paincan cause you to overdose. Readthe labels on your OTC medicineswith care. This will help youto clearly know the list of ingredients, how much to take, and anywarnings. It may also help you not take too muchacetaminophen.If you have questions or don't understand the information, ask your pharmacist or healthcare provider to explain it to you before you take the OTC medicine.  Managing nausea  Some people have an upset stomach after surgery. This is often because of anesthesia, pain, or pain medicine, or the stress of surgery. These tips will help you handle nausea and eat   healthy foods as you get better. If you were on a special food plan before surgery, ask your healthcare provider if you should follow it while you get better. These tips may help:   Don't push yourself to eat. Your body will tell you when to eat and how much.   Start off with clear liquids and soup. They are easier to digest.   Next try semi-solid foods, such as mashed potatoes, applesauce, and gelatin, as you feel ready.   Slowly move to solid  foods. Don't eat fatty, rich, or spicy foods at first.   Don't force yourself to have 3 large meals a day. Instead eat smaller amounts more often.   Take pain medicines with a small amount of solid food, such as crackers or toast, to prevent nausea.  When to call your healthcare provider  Call your healthcare provider if:   You still have intolerable pain an hour after taking medicine. The medicine may not be strong enough.   You feel too sleepy, dizzy, or groggy. The medicine may be too strong.   You have side effects such as nausea or vomiting, or skin changes such as rash, itching, or hives.Your healthcare provider may suggest other medicines to control side effects.  Rash, itching, or hives may mean you have an allergic reaction. Report this right away. If you have trouble breathing or facial swelling, call 911 right away.  If you have obstructive sleep apnea  You were given anesthesia medicine during surgery to keep you comfortable and free of pain. After surgery, you may have more apnea spells because of this medicine and other medicines you were given. The spells may last longer than usual.  At home:   Keep using the continuous positive airway pressure (CPAP) device when you sleep. Unless your healthcare provider tells you not to, use it when you sleep, day or night. CPAP is a common device used to treat obstructive sleep apnea.   Talk with your provider before taking any pain medicine, muscle relaxants, or sedatives. Your provider will tell you about the possible dangers of taking these medicines.  [employer] last reviewed this educational content on 03/23/2017   2000-2020 [employer] [address], [place] [address]. All rights reserved. This information is not intended as a substitute for professional medical care. Always follow your healthcare professional's instructions.

## 2021-11-10 NOTE — Anesthesia Procedure Notes (Signed)
Nerve Block    Staff:   Performing provider: Anissa Abbs, Kandra Nicolas, CRNA  Authorizing provider: Beulah Gandy, MD    Block Indication/Location:   Patient location: OR/Procedural area  Reason for block: primary anesthetic.    Consent:   Type: verbal consent obtained  Risk discussed: block failure or incomplete block, motor weakness, nerve injury, bleeding, infection and local anesthetic toxicity.  Obtained from: patient    Regional anesthesia checklist:  Patient identifed with two distinct identifiers  Procedural consent reviewed  Block site confirmed by hospital protocol  Allergies reviewed  Anticoagulation status reviewed  Maximum local anesthetic dose calculated  Emergency drugs available  Medication syringes labeled  Monitoring in place    Block type:   Procedure type: single shot  Block laterality: right  Block: Bier block    Preparation:   Patient position: supine  Patient sedation: sedated  Monitors: SpO2, ECG, NIBP and EtCO2    Block needle:   Needle gauge: 22 G  Guidance: landmark    Date and time block placed:   11/10/2021 10:20 AM    Final block comments:   14cc 1% lido plus 1 cc sodium bicarb

## 2021-11-11 ENCOUNTER — Telehealth (INDEPENDENT_AMBULATORY_CARE_PROVIDER_SITE_OTHER): Payer: Self-pay

## 2021-11-11 ENCOUNTER — Telehealth (HOSPITAL_BASED_OUTPATIENT_CLINIC_OR_DEPARTMENT_OTHER): Payer: Self-pay | Admitting: Pharmacist

## 2021-11-11 DIAGNOSIS — S63641D Sprain of metacarpophalangeal joint of right thumb, subsequent encounter: Secondary | ICD-10-CM

## 2021-11-11 DIAGNOSIS — Z4789 Encounter for other orthopedic aftercare: Secondary | ICD-10-CM

## 2021-11-11 MED ORDER — OXYCODONE HCL 5 MG OR TABS
5.0000 mg | ORAL_TABLET | ORAL | 0 refills | Status: DC | PRN
Start: 2021-11-11 — End: 2021-11-14

## 2021-11-11 NOTE — Telephone Encounter (Addendum)
ORTHOPEDIC PHARMACY NOTE       Patient Phone: [phone]   Pharmacy: [facility]    Referring Physician: Ronalee Red    Last Appt: 11/10/2021 surgery    Next Appt: 10/30    Six week post injury/surgery date: 11/30    Diagnosis: 11/10/21 Right Thumb Ulnar Collateral Ligament Repair     Current Medication Requested: 10/19 Oxycodone 5 mg #20 1-2 tabs po q4h prn severe pain    Other Pertinent Information: None    Review of patient's allergies indicates:  Allergies   Allergen Reactions    Erythromycin Other     Strange smells but no rash or trouble breathing.    Ibuprofen      TX FROM PAPER CHART      Pneumovax [Pneumococcal Polysaccharide Vaccine]      Rash/fever (arm)     Sulfa Antibiotics      TX FROM PAPER CHART       Assessment/Plan: Received in basket message requesting oxycodone prescription. Angeleigh reports taking oxycodone 5 mg every 4 hours with 14 tablets left. She also reports taking 1 tablet of Advil every 12 hours and taking acetaminophen as prescribed. Discussed eventual tapering of opioid medication. Patient verbalized understanding.  Oxycodone 5 mg #30 1-2 tabs po q4h prn severe pain.    Orders Placed This Encounter    oxyCODONE 5 MG tablet     Naloxone prescribed:  directions for how to obtain included in discharge materials 10/19  Opioid education materials:  discharge AVS 10/19

## 2021-11-11 NOTE — Telephone Encounter (Signed)
I, Sameera Betton, was present for the call, helped formulate the plan, wrote the prescription and coauthored this note.

## 2021-11-11 NOTE — Addendum Note (Signed)
Addended by: Lisbeth Ply on: 11/11/2021 03:53 PM     Modules accepted: Orders

## 2021-11-11 NOTE — Telephone Encounter (Signed)
Situation:  Cassie Contreras is calling  requesting refill of oxycodone  Background: 11/10/21  Right Thumb Ulnar Collateral Ligament Repair   Assessment: routing refill request  PA and Pharmacy pool. Patient takes she takes 6 tablets a day of oxycodone  Recommendation:  refill oxycodone.    [name], Agar

## 2021-11-14 ENCOUNTER — Telehealth (INDEPENDENT_AMBULATORY_CARE_PROVIDER_SITE_OTHER): Payer: Self-pay | Admitting: Hand Surgery

## 2021-11-14 MED ORDER — OXYCODONE HCL 5 MG OR TABS
2.5000 mg | ORAL_TABLET | Freq: Four times a day (QID) | ORAL | 0 refills | Status: DC | PRN
Start: 2021-11-14 — End: 2022-03-09

## 2021-11-14 NOTE — Telephone Encounter (Signed)
RETURN CALL: Voicemail - Detailed Message      SUBJECT:  General Message     MESSAGE: Patient has been taking nausea medication once every 4 hours. She is still feeling nauseas and dizzy. Last night she was burping and food was coming up. Her stomach is not feeling well. She would like to speak to care team. Patient had surgery on Thursday for her hand.

## 2021-11-14 NOTE — Telephone Encounter (Signed)
Situation:   Called and  spoke  to patient. States experiencing nausea 1-2 times a day after one hour taking oxycodone 5mg   Background: DOS 11/10/21 right thumb Ulnar collateral ligament repair and internal bracing  Assessment:  Patient is only taking oxycodone twice a day. Informed pateint to start taking tylenol 500-1000mg  three times a day for pain and can use Ibuprofen 400mg  for swelling and pain as well. Informed to take food with the medication and to restart the omperazole medication.  Recommendation:  Patient will start tylenol instead of oxycodone and will update on progress.    [name], Corinne

## 2021-11-14 NOTE — Telephone Encounter (Signed)
Oxycodone 5mg  20# sent. Last postop narcotic refill from surgical team per clinic policy.     Erin Fulling, MPAP PA-C  [facility]  Hand, Wrist, and Elbow Surgery

## 2021-11-19 NOTE — Progress Notes (Unsigned)
Harlym Gehling is a 71 year old female who is now 2 weeks s/p  R thumb UCL repair  DOS 11/10/2021    Patient has been in a plaster splint post-op. She rates her pain as 1/10.    EXAM  Incision is clean and dry, intact, no dehiscence, well-healed.  Sensation to light touch is intact over the radial/ulnar aspect of the R thumb  Thumb is mildly deviated radially at the MCP joint      STUDIES  X-rays of the R thumb show suture anchors in the thumb proximal phalanx ulnarly in good position. There is residual radial subluxation of the thumb MCP joint but no joint space widening ulnarly.  No change in alignment. No evidence of hardware loosening.    IMPRESSION  2 weeks R thumb UCL repair. Doing well.    PLAN  Sutures removed in clinic today.  Referral to hand therapy for thumb spica orthosis  Active ROM.  Coffee cup (2 lbs) lifting restriction  Follow-up in clinic in 4 weeks  Repeat X-rays 3-views R thumb      Loistine Simas, MD  Professor, Dept of Orthopaedics and [place] [facility]  Hand, Wrist, and Elbow Surgery

## 2021-11-21 ENCOUNTER — Encounter (INDEPENDENT_AMBULATORY_CARE_PROVIDER_SITE_OTHER): Payer: Self-pay | Admitting: Hand Surgery

## 2021-11-21 ENCOUNTER — Ambulatory Visit
Admission: RE | Admit: 2021-11-21 | Discharge: 2021-11-21 | Disposition: A | Discharge status: Home / Self Care | Payer: PPO | Source: Ambulatory Visit | Attending: Diagnostic Radiology | Admitting: Diagnostic Radiology

## 2021-11-21 ENCOUNTER — Encounter (HOSPITAL_BASED_OUTPATIENT_CLINIC_OR_DEPARTMENT_OTHER): Payer: Self-pay

## 2021-11-21 ENCOUNTER — Ambulatory Visit: Payer: PPO | Attending: Orthopedic Surgery | Admitting: Rehabilitative and Restorative Service Providers"

## 2021-11-21 ENCOUNTER — Ambulatory Visit (INDEPENDENT_AMBULATORY_CARE_PROVIDER_SITE_OTHER): Payer: PPO | Admitting: Hand Surgery

## 2021-11-21 VITALS — BP 122/78 | HR 76 | Temp 97.9°F

## 2021-11-21 DIAGNOSIS — Z4789 Encounter for other orthopedic aftercare: Secondary | ICD-10-CM

## 2021-11-21 DIAGNOSIS — M79641 Pain in right hand: Secondary | ICD-10-CM | POA: Insufficient documentation

## 2021-11-21 DIAGNOSIS — S63641A Sprain of metacarpophalangeal joint of right thumb, initial encounter: Secondary | ICD-10-CM | POA: Insufficient documentation

## 2021-11-21 DIAGNOSIS — M25641 Stiffness of right hand, not elsewhere classified: Secondary | ICD-10-CM | POA: Insufficient documentation

## 2021-11-21 NOTE — Progress Notes (Signed)
Post-op splint was removed from pt's R hand. No visible signs of infection at the wound site. Maceration noted at wound site. Monocryl sutures in place. Pt tolerated the procedure well.  Pt was sent to X-ray for radiographs.

## 2021-11-21 NOTE — Progress Notes (Signed)
OCCUPATIONAL THERAPY INITIAL EVALUATION (Medicare)    This note serves as a CMS evaluation form that requires the referring provider to certify the need for therapy services furnished under this Plan of Treatment. The signing provider is certifying the plan of care.  Certification From*: 11/21/21  Certification To*: 02/19/22  GENERAL VISIT INFO    Encounter Diagnoses   Name Primary?    Rupture of ulnar collateral ligament of right thumb, initial encounter Yes    Right hand pain     Stiffness of finger joint of right hand      Referring Provider: Dr. Cristela Felt   Referring Provider NPI: Dr. Cristela Felt   Reason for referral/Medical necessity:  Patient is a right hand dominant female who was picking up a stack of dishes when she felt pain and instability in the right thumb.  Surgery 11-10-2021 for repair of UCL thumb MPJ.  Lives in [place] with spouse .  She also has support of daughter and son in law who live close by.  She is retired and enjoys drawing and painting  She works out with a Merchant navy officer     VISIT COUNT   1    G-CODE VISIT COUNT  1    EVALUATION    General Assessment  Certification From*: 11/21/21  Certification To*: 02/19/22  Date of Symptom Onset*: 11/10/21  Start of Care Date*: 11/21/21  Reason for Referral*: Patient is a right hand dominant female who was picking up a stack of dishes when she felt pain and instability in the right thumb.  Surgery 11-10-2021 for repaijnr of UCL thumb MPJ.  Lives in [place] with spouse .  She also has suport of daughter and son in law who lives close by.  She is retired and enjoys drawing and painting  She works out with a Psychologist, educational 2 days a week  Date of Surgery: 11/10/21  Type of Surgery: Thumb MPJ UCL repair  Precautions: less 2# CCWB  Precaution From: 11/10/21  Precaution To: 12/21/21  Best Level of Pain: 0 - No pain  Worst Level of Pain: 1  Aggravating Factors: accidnetal contact or excessive motion  Relieving Factors: rest  Patient Outcome Tool:  QuickDash  Outcome Score: 61.36  Body Part/Category: thumb  Based on screening, patient's fall risk is low.    Prior Level of Function  Level of Independence: Independent with ADLs and functional transfers  Lives With: Spouse  Receives Help From: Family  Ambulation: Independent  ADL Performance: Independent  Artist: Independent  Vocational: Retired  Leisure: Product manager , Scientist, forensic    Past Medical History:   Diagnosis Date    Abdominal pain     Anxiety     Arthritis     Asthma     Constipation     Depression     GERD (gastroesophageal reflux disease)     Headache     Heart palpitations     History of irritable bowel syndrome     Hormone disorder     Metatarsal stress fracture of right foot     TX FROM PAPER CHART      Primary insomnia     Squamous cell carcinoma in situ of skin     Thyroid disease     UTI (urinary tract infection)        Past Surgical History:   Procedure Laterality Date    PR UNLISTED PROCEDURE FOOT/TOES      PR UNLISTED PROCEDURE HANDS/FINGERS  PR UNLISTED PROCEDURE LEG/ANKLE      Tonsilectomy      Childhood    URETHRAL DILATION/DVIU         ASSESSMENT  Cassie Contreras is a 71 year old female who presents to Occupational Therapy with a referral for repair of the right thumb MPJ UCL 11-10-2021. Signs/symptoms consistent with this diagnosis. Patient would benefit from skilled Occupational Therapy to focus on restoration of motion and use as well as protection of the healing UCL  and achieve functional goals below. Patient's primary goal is to return to drawing, painting and grasp of workout tools.   Factors/barriers that may delay or affect course of care include: pertinent past medical history as listed above, Chronicity or severity and Comorbidities, complications, impairment, no cultural or psychosocial     Previous Interventions: orthosis at previous pre op visit     Barriers to learning: none    Preferred learning style: demonstration, written, and  verbal    Rehabilitation potential is: Excellent      TREATMENT AND PLAN    Education  Primary Learner: Patient  Preferred Language: English  Education Preference: Verbal;Reading/Handout;Demonstration  Method of Education: Verbal;Demonstration;Handout  Response to Education: Demonstrates independently  Barriers to Education: None  Cultural practices that influence care: None    Therapy Goals  Patient Goals: getting back ot art with paint brush, pencils charcoal  Potential Barriers to Achieving Goals: Chronicity or Severity;Comorbidities;Complications    Discharge Plan  Therapy Review Date: 02/02/22  Therapy Frequency: 1/wk  Anticipated Discharge Date: 02/02/22    Plan of Care:  Frequency: bi weekly   Duration: 12 weeks   Time: 45 minutes     Long-term functional goals: Patient able to grasp the pencil, paint brush and writing tool in her right hand for art work  by 02/19/22    These goals were discussed and the patient agrees with them.    Patient Education: verbal instructions and demonstrations were provided for the above exercises  a handout was provided describing the exercises in written and picture format. The patient was able to demonstrate the exercises after instructions., The patient was able to communicate understanding of the instructions and precautions.    Interventions/Treatment provided:  Occupational therapy evaluation   Review of notes in epic, verbal referral from Dr. Carmina Miller to remold the orthosis to prevent excessive radial deviation as she tends to rest in this position and operative notes   Interview of patient for timeline of injury to surgery and self care demands with instruciton of precautions and mannitan neural alignment , only active range of motion, no passive range of motion  and no strengthening at this time with use of the orthosis at all times except therapy exercises and hygiene and how this relates to self care   Measurements for evaluation of pain, edema, functional use,  active range of motion and incision wound care to assess treatment plan, mark progress and establish goals   Instruction in no immersion, monitor for infection and maceration   Pain : patient rates pain on a 0-10 pain scale with a  0-1/10 at this time   Edema circumferential  P1 thumb Right 7 cm Left 6 cm   Active range of motion Thumb MPJ Right 0-20 Left 0-35  IP Right 0-30 Left 050   Oppositional scale  Left Kapandji oppositional scale 10  Right 7 (radial deviates with oppositional)   Strength: contraindicated  Incision : sutures out in the referring provider office, steri  strips supplied, no Dehist no drainage or purulent signs of infection were noted   Observation: moderate edema and ecchymosis thumb     Therapy exercises   Instruciton in active range of motion of motion in neutral plane with care to not deviated radially and corrected with verbal and visual cuing   Handouts and completion in the clinic with active range of motion of the thumb IPJ and MPJ and after attempts of opposition , decision to hold on opposition as she radial deviates with this motion   5-10 reps 4 x day in pain free active range of motion arc     Orthosis management and training  Patient arrives with existing custom thumb orthosis [id number].  Orthosis was remolded to allow for reduction of radial deviation and change of status with surgery.  Patient was given handouts for wear and care, taught capillary refill and it was brisk in the orthosis and was able to don doff independently and endorsed the comfort of the orthosis,  She was given numbers for contact if any changes were needed prior to the next visit.  New stockinet and straps given     Interventions for next therapy session: modificaiton to the orthosis as needed  and progression of scar massage, cica care for nocturnal use and progression of motion       Cassie Contreras ANN

## 2021-11-22 NOTE — Addendum Note (Signed)
Addended by: Verlon Setting on: 11/22/2021 05:10 PM     Modules accepted: Orders

## 2021-11-30 ENCOUNTER — Encounter (INDEPENDENT_AMBULATORY_CARE_PROVIDER_SITE_OTHER): Payer: Self-pay | Admitting: Hand Surgery

## 2021-12-02 ENCOUNTER — Encounter (INDEPENDENT_AMBULATORY_CARE_PROVIDER_SITE_OTHER): Payer: Self-pay | Admitting: Hand Surgery

## 2021-12-05 ENCOUNTER — Ambulatory Visit: Payer: PPO | Attending: Orthopedic Surgery | Admitting: Rehabilitative and Restorative Service Providers"

## 2021-12-05 ENCOUNTER — Telehealth (INDEPENDENT_AMBULATORY_CARE_PROVIDER_SITE_OTHER): Payer: Self-pay | Admitting: Podiatrist

## 2021-12-05 DIAGNOSIS — M79641 Pain in right hand: Secondary | ICD-10-CM | POA: Insufficient documentation

## 2021-12-05 DIAGNOSIS — M25641 Stiffness of right hand, not elsewhere classified: Secondary | ICD-10-CM | POA: Insufficient documentation

## 2021-12-05 DIAGNOSIS — S63641A Sprain of metacarpophalangeal joint of right thumb, initial encounter: Secondary | ICD-10-CM | POA: Insufficient documentation

## 2021-12-05 DIAGNOSIS — Z4789 Encounter for other orthopedic aftercare: Secondary | ICD-10-CM | POA: Insufficient documentation

## 2021-12-05 NOTE — Telephone Encounter (Signed)
Pt called and can barley walk, thinks her past sx is causing leaking in her ankle. She said to call and ask for Diane to schedule her ASAP.

## 2021-12-05 NOTE — Progress Notes (Addendum)
OCCUPATIONAL THERAPY DAILY NOTE  (Medicare)      Certification From*: 11/21/21  Certification To*: 02/19/22  GENERAL VISIT INFO    Encounter Diagnoses   Name Primary?    Rupture of ulnar collateral ligament of right thumb, initial encounter Yes    Right hand pain     Stiffness of finger joint of right hand      Referring Provider: Dr. Cristela Felt   Referring Provider NPI: Dr. Cristela Felt   Reason for referral/Medical necessity:  Patient is a right hand dominant female who was picking up a stack of dishes when she felt pain and instability in the right thumb.  Surgery 11-10-2021 for repair of UCL thumb MPJ.  Lives in [place] with spouse .  She also has support of daughter and son in law who live close by.  She is retired and enjoys drawing and painting  She works out with a Merchant navy officer     VISIT COUNT   2    G-CODE VISIT COUNT  2  EVALUATION     Based on screening, patient's fall risk is low.     Past Medical History:   Diagnosis Date    Abdominal pain     Anxiety     Arthritis     Asthma     Constipation     Depression     GERD (gastroesophageal reflux disease)     Headache     Heart palpitations     History of irritable bowel syndrome     Hormone disorder     Metatarsal stress fracture of right foot     TX FROM PAPER CHART      Primary insomnia     Squamous cell carcinoma in situ of skin     Thyroid disease     UTI (urinary tract infection)        Past Surgical History:   Procedure Laterality Date    PR UNLISTED PROCEDURE FOOT/TOES      PR UNLISTED PROCEDURE HANDS/FINGERS      PR UNLISTED PROCEDURE LEG/ANKLE      Tonsilectomy      Childhood    URETHRAL DILATION/DVIU         ASSESSMENT  Cassie Contreras is a 71 year old female who presents to Occupational Therapy with a referral for repair of the right thumb MPJ UCL 11-10-2021. Signs/symptoms consistent with this diagnosis. Patient would benefit from skilled Occupational Therapy to focus on restoration of motion and use as well as protection of the healing UCL   and achieve functional goals below. Patient's primary goal is to return to drawing, painting and grasp of workout tools.   Factors/barriers that may delay or affect course of care include: pertinent past medical history as listed above, Chronicity or severity and Comorbidities, complications, impairment, no cultural or psychosocial     Update: patient arrives with issues with maceration of the previous thermoplastic orthosis , she did contact the RN and was told to remove and allow air time.  She has made gains in her motion, incision is closed and scabbed.  She has not been wearing the orthosis since this occurred   New orthosis from a Oficast material to allow more airflow and fabricated over a Cica care gel pad to allow more room in the web space and reduce pressure on the incision.  At completion , she states more comfortable and don doffed independently and understands the need to be in an orthosis for protection  except for hygiene and therapy exercises.      Previous Interventions: orthosis at previous pre op visit    Barriers to learning: none    Preferred learning style: demonstration, written, and verbal    Rehabilitation potential is: Excellent      TREATMENT AND PLAN    Plan of Care:  Frequency: bi weekly   Duration: 12 weeks   Time: 45 minutes     Long-term functional goals: Patient able to grasp the pencil, paint brush and writing tool in her right hand for art work  by 02/19/22    These goals were discussed and the patient agrees with them.    Patient Education: verbal instructions and demonstrations were provided for the above exercises  a handout was provided describing the exercises in written and picture format. The patient was able to demonstrate the exercises after instructions., The patient was able to communicate understanding of the instructions and precautions.    Interventions/Treatment provided:  Therapy exercises 10 minutes   Patient arrives to therapy without orthosis and has a bandage  covering her incision.    Inspection of the incision demonstrates scabbing and some light maceration at the incision and web space   Instruciton in use of air time without bandages as moisture is healed and less hair flow  Review of notes in epic, verbal referral from Dr. Cristela Felt to remold the pre op orthosis to prevent excessive radial deviation as she tends to rest in this position   Interview of patient for maceration history this week and use of the orthosis.  She notes that she has not used the orthosis since RN conversation but has been careful to not resist or use the thumb in  self care demands.  Instruciton of precautions of ongoing need for orthosis with neural alignment, active range of motion, no passive range of motion  and no strengthening at this time with use of the orthosis at all times except therapy exercises and hygiene and how this relates to self care   Re-Measurements for update of pain, edema, functional use, active range of motion and incision wound care to update treatment plan, mark progress and goals   Instruction in no immersion, monitor for infection and maceration   Pain : patient rates pain on a 0-10 pain scale with a  1/10 at this time   Edema circumferential  P1 thumb Right 6.3 cm Left 6 cm   Active range of motion Thumb MPJ Right 0-40 Left 0-35  IP Right 0-45 Left 050   Oppositional scale  Left Kapandji oppositional scale 10  Right 7 (radial deviates with oppositional)   Strength: contraindicated  Incision : scabs present at the incision , no Dehist no drainage or purulent signs of infection, but mild maceration noted after new orthosis fabricated with wetness of the orthosis  Observation: mild edema and resolved ecchymosis thumb   Instruciton in active range of motion of motion in neutral plane with care to not deviated radially with active range of motion of the thumb IPJ and MPJ and opposition to index and middle only to reduce radial deviation position at the MPJ 5-10 reps  4 x day in pain free active range of motion arc   12-05-2021  Orthosis management and training 30 minutes   Patient arrives without any orthosis  Remold of Orficast material to allow for reduction of radial deviation but increased airflow with change of status of skin integrity  Patient was given handouts for wear  and care, taught capillary refill and it was brisk in the orthosis and was able to don doff independently and endorsed the comfort of the orthosis,  She was given numbers for contact if any changes were needed prior to the next visit.  New stockinet and straps given     Interventions for next therapy session: modificaiton to the orthosis as needed  and progression of scar massage, cica care for nocturnal use and progression of motion     Karinne Schmader ANN

## 2021-12-06 NOTE — Telephone Encounter (Signed)
Talked to patient , scheduled a soon appt for her

## 2021-12-08 ENCOUNTER — Ambulatory Visit (INDEPENDENT_AMBULATORY_CARE_PROVIDER_SITE_OTHER): Payer: PPO | Admitting: Podiatrist

## 2021-12-08 ENCOUNTER — Encounter (INDEPENDENT_AMBULATORY_CARE_PROVIDER_SITE_OTHER): Payer: Self-pay | Admitting: Podiatrist

## 2021-12-08 DIAGNOSIS — M85679 Other cyst of bone, unspecified ankle and foot: Secondary | ICD-10-CM

## 2021-12-08 DIAGNOSIS — M25572 Pain in left ankle and joints of left foot: Secondary | ICD-10-CM

## 2021-12-08 DIAGNOSIS — M76822 Posterior tibial tendinitis, left leg: Secondary | ICD-10-CM

## 2021-12-08 DIAGNOSIS — M958 Other specified acquired deformities of musculoskeletal system: Secondary | ICD-10-CM

## 2021-12-08 DIAGNOSIS — M85672 Other cyst of bone, left ankle and foot: Secondary | ICD-10-CM

## 2021-12-08 MED ORDER — METHYLPREDNISOLONE ACETATE 40 MG/ML IJ SUSP
40.0000 mg | Freq: Once | INTRAMUSCULAR | Status: AC
Start: 2021-12-08 — End: 2021-12-08
  Administered 2021-12-08: 40 mg via INTRA_ARTICULAR

## 2021-12-08 NOTE — Progress Notes (Signed)
Cassie Contreras is a 71 year old year old female who presents today for re-evaluation of left foot.    The patient states got flare up, was told to come in for injection under fluro when she got flare up.     The current condition has existed for long time .    Patient describes the symptoms as soreness, pain, .     The level the pain/severity of pain is moderate.   The condition is worse when active.     Current treatments/recent studies include shoes, inserts, ice,elevation .     The response to the treatment is somewhat helpful.     Patient's activity limitation is limited in recreation.

## 2021-12-08 NOTE — Progress Notes (Signed)
Patient: Cassie Contreras   Patient DOB: 06-11-1950     DOS:  12/08/2021     Accompanied by:  patient was unaccompanied    Subjective:  (history is obtained by MA/RN and edited by Dr. Selena Batten)  Cassie Contreras is a 71 year old year old female who presents today for re-evaluation of left foot.    The patient states got flare up, was told to come in for injection under fluro when she got flare up.     The current condition has existed for long time .    Patient describes the symptoms as soreness, pain, .     The level the pain/severity of pain is moderate.   The condition is worse when active.   Current treatments/recent studies include shoes, inserts, ice,elevation .     The response to the treatment is somewhat helpful.     Patient's activity limitation is limited in recreation and can not run.     Goals: evaluate current condition    PCP:  [name], MD     PMH:   Active Ambulatory Problems     Diagnosis Date Noted    Os peroneum syndrome of left foot     S/P foot & ankle surgery, left 08/01/2012    Neuritis of left sural nerve 09/29/2014    Recurrent UTI 11/10/2015    Osteopenia of spine 11/10/2015    Atrophic vaginitis 11/15/2015    Hormone replacement therapy 11/15/2015    Thyroid nodule 03/22/2017    Mixed incontinence 04/20/2017    Pelvic floor weakness 04/20/2017    Obstructive sleep apnea 01/11/2018    Benign hypertension 02/20/2019    Anxiety 02/20/2019    Breast lump on right side at 11 o'clock position 02/20/2019    Migraine with aura and without status migrainosus, not intractable 01/29/2020    New onset of headaches after age 55 02/05/2020    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) 04/07/2020    Rupture of ulnar collateral ligament of right thumb 09/12/2021    Right hand pain 11/21/2021    Stiffness of finger joint of right hand 11/21/2021     Resolved Ambulatory Problems     Diagnosis Date Noted    Stress reaction of tibia     Left ankle pain     Metatarsal stress fracture of right foot      Ankle impingement syndrome 12/02/2012    Stress fracture of metatarsal bone 12/02/2012    Ingrown nail,fibular border hallux, left 10/20/2013    Palpitations 11/10/2015    Dysphonia 04/05/2017     Past Medical History:   Diagnosis Date    Abdominal pain     Arthritis     Asthma     Constipation     Depression     GERD (gastroesophageal reflux disease)     Headache     Heart palpitations     History of irritable bowel syndrome     Hormone disorder     Primary insomnia     Squamous cell carcinoma in situ of skin     Thyroid disease     UTI (urinary tract infection)         Review of patient's allergies indicates:  Allergies   Allergen Reactions    Erythromycin Other     Strange smells but no rash or trouble breathing.    Ibuprofen      TX FROM PAPER CHART  Pneumovax [Pneumococcal Polysaccharide Vaccine]      Rash/fever (arm)     Sulfa Antibiotics      TX FROM PAPER CHART          Medications:   Outpatient Medications Prior to Visit   Medication Sig Dispense Refill    acetaminophen 500 MG tablet Take 2 tablets (1,000 mg) by mouth every 8 hours as needed for pain. 50 tablet 3    bimatoprost (Latisse) 0.03 % topical solution Apply 1 application topically at bedtime. Apply upper eyelid at base of eyelashes with applicator. Blot excess solution. 3 mL 0    clindamycin 1 % gel Apply topically 2 times a day. Apply to face. 30 g 1    doxycycline monohydrate 100 MG capsule Take 1 capsule (100 mg) by mouth daily. 90 capsule 3    estradiol 10 MCG vaginal tablet Place 1 tablet (10 mcg) into the vagina 2 times a week. 24 tablet 3    memantine 10 MG tablet Take 1 tablet (10 mg) by mouth daily. 30 tablet 11    omeprazole 20 MG DR capsule take 1 capsule by mouth once daily ON AN EMPTY STOMACH 90 capsule 2    ondansetron 4 MG tablet Take 1 tablet (4 mg) by mouth every 8 hours as needed for nausea/vomiting. 20 tablet 0    oxyCODONE 5 MG tablet Take 0.5-1 tablets (2.5-5 mg) by mouth every 6 hours as needed for severe pain. 20 tablet  0    senna 8.6 MG tablet Take 1 tablet (8.6 mg) by mouth 2 times a day to prevent constipation while on opioids. 20 tablet 0    tretinoin 0.05 % cream APPLY TO AFFECTED AREA(S) ON FACE AT BEDTIME 20 g 1    valACYclovir 500 MG tablet Take 1 tablet (500 mg) by mouth daily. 30 tablet 1     No facility-administered medications prior to visit.        PSH:   Past Surgical History:   Procedure Laterality Date    PR UNLISTED PROCEDURE FOOT/TOES      PR UNLISTED PROCEDURE HANDS/FINGERS      PR UNLISTED PROCEDURE LEG/ANKLE      Tonsilectomy      Childhood    URETHRAL DILATION/DVIU          Family History:  family history includes Heart Attack (age of onset: 5859) in her sister.    Social History:  Social History     Socioeconomic History    Marital status: Married     Spouse name: Not on file    Number of children: Not on file    Years of education: Not on file    Highest education level: Not on file   Occupational History    Not on file   Tobacco Use    Smoking status: Former    Smokeless tobacco: Never   Substance and Sexual Activity    Alcohol use: Yes     Alcohol/week: 1.0 standard drink of alcohol     Types: 1 Glasses of wine per week     Comment: occ.    Drug use: No    Sexual activity: Yes     Partners: Male   Other Topics Concern    Not on file   Social History Narrative    Not on file     Social Determinants of Health     Financial Resource Strain: Not on file   Food Insecurity: Not on file   Transportation  Needs: Not on file   Physical Activity: Not on file   Stress: Not on file   Social Connections: Not on file   Intimate Partner Violence: Not on file   Housing Stability: Not on file        Physical Exam:   There were no vitals filed for this visit.     General: The patient is 71 year old year old female White  with constitutional symptom: appears age stated.    Psychiatric/mood: normal mood and affect.    Neurological: alert and oriented x3.    Vascular:   LEFT:  Dorsalis pedis pulse is palpable.  Posterior tibial  pulse is palpable. Capillary filling time note to be immediate  on the distal hallux.      Neurologic:  LEFT:  The sensation is intact on the foot and ankle.  Motor coordination is intact with normal muscle tone.  There is no manifestations of pathological reflexes noted.      Dermatological:   LEFT:  The long surgical scar(s) along the medial gutter of the ankle joint is healed with minimal scar.   Upon inspection, the skin note to have normal texture/tone/turgor.  There is no ulceration/open wound or dermatosis present.  Integument coloration is within normal range and temperature is within normal range.  The hair growth is normal.      Musculoskeletal:   LEFT:  There is mild pain on palpation along the the ankle joint and medial malleolus.  There is no swelling noted over the affect region. There is no crepitation on ROM on the affected area.                 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  Discussion included spectrum of treatments/diagnostic test available for this condition that includes   continue current care, altering activities, cortisone injection and surgery.   After the discussion, the patient elected diagnostic ankle injection.      INJECTION:  Fluoroscopic guided needle placement for a corticosteroid injection was administered to medial gutter of the ankle joint LEFT  was performed this date to reduce symptoms and to assess the joint.  Fluoroscopy is needed to properly and accurately place the needle in the proper joint.   The injection site was prepped with betadine paint three  times and ethyl chloride was used as a topical anesthetic.  Three parts: 2% Lidocaine, DepoMedrol (20mg ) and Omnipaque (300 mgI) was then administered and wasted 20 mg on vile.  During and at the end of the injection, the contrast media was assessed.  The contrast(Omnipaque) of 1 ml.  The patient was cautioned regarding hypopigmentation, fat atrophy and steroid flare.   We recommend that patient take NSAID and ice on the injection for couple days.   If breast feeding, hold breast feeding for 24 hours.              Pain relief post diagnostic injection:  Patient states little relief of pain after 5- 10 minute.  Interestingly, she has mark relief of pain after 30 minutes when running.  There is residual pain on  along the posterior tibial tendon and posterior medial malleolus.     INJECTION:  Fluoroscopic guided needle placement for a diagnostic injection was administered to posterior tibial tendon, LEFT  was performed this date to reduce symptoms and to assess the joint.  Fluoroscopy is needed to properly and accurately place the needle in the proper joint.   The injection site was prepped with betadine paint three times and ethyl chloride was used as a topical anesthetic.  Three parts: 2% Lidocaine and Omnipaque (300 mgI) 1 ml..  During and at the end of the injection, the contrast media was assessed.    Pain relief post diagnostic injection:  Patient states complete relief of pain during anesthetic phase.   She can run without pain.          We have reviewed fluoroscopic image finding and detail discussion took place with the patient regards.       She will keep diary of pain for next couple weeks.  She was instructed rest and avoid impact activity for one week.     Return appointment: follow up in 3-4 weeks if symptom worsen or fail to improve    Randa Spike DPM, FACFAS  Reconstructive Ankle and Foot Surgeon  [facility]

## 2021-12-11 ENCOUNTER — Encounter (INDEPENDENT_AMBULATORY_CARE_PROVIDER_SITE_OTHER): Payer: Self-pay | Admitting: Podiatrist

## 2021-12-21 ENCOUNTER — Ambulatory Visit (HOSPITAL_BASED_OUTPATIENT_CLINIC_OR_DEPARTMENT_OTHER): Payer: PPO | Admitting: Rehabilitative and Restorative Service Providers"

## 2021-12-21 DIAGNOSIS — Z4789 Encounter for other orthopedic aftercare: Secondary | ICD-10-CM

## 2021-12-21 DIAGNOSIS — S63641A Sprain of metacarpophalangeal joint of right thumb, initial encounter: Secondary | ICD-10-CM

## 2021-12-21 DIAGNOSIS — M79641 Pain in right hand: Secondary | ICD-10-CM

## 2021-12-21 DIAGNOSIS — M25641 Stiffness of right hand, not elsewhere classified: Secondary | ICD-10-CM

## 2021-12-21 NOTE — Progress Notes (Signed)
OCCUPATIONAL THERAPY DAILY NOTE    Certification from 11/21/21 through 02/19/22    GENERAL VISIT INFO    Diagnosis: 71 year old female s/p right thumb MCP joint UCL repair on 11/10/21    Therapy diagnosis: pain and stiffness right thumb    Precautions: No strengthening for 3 months and No passive ROM for 6 weeks postop     History: Patient is a 71 year old right hand dominant female who was picking up a stack of dishes when she felt pain and instability in the right thumb. Surgery 11-10-2021 for repair of UCL thumb MPJ.  Lives in [place] with spouse. She also has support of daughter and son in law who live close by. She is retired and enjoys drawing and painting  She works out with a Merchant navy officer.    Encounter Diagnoses   Name Primary?    Rupture of ulnar collateral ligament of right thumb, initial encounter Yes    Right hand pain     Stiffness of finger joint of right hand     Aftercare following surgery of the musculoskeletal system      Referring Provider: Gardiner Rhyme, PA-C   Referring Provider NPI: [id number]  Reason for Referral: Please Evaluate and Treat, including: wound care with debridements, edema control, scar management, desensitization, and AROM of wrist and fingers.  Orthosis: thumb spica orthosis   Treatment frequency as determined by the Therapist.     VISIT COUNT   3    EVALUATION     General Assessment  Certification From*: 11/21/21  Certification To*: 02/19/22  Date of Symptom Onset*: 11/10/21  Start of Care Date*: 11/21/21  Reason for Referral*: Patient is a right hand dominant female who was picking up a stack of dishes when she felt pain and instability in the right thumb.  Surgery 11-10-2021 for repaijnr of UCL thumb MPJ.  Lives in [place] with spouse .  She also has suport of daughter and son in law who lives close by.  She is retired and enjoys drawing and painting  She works out with a Psychologist, educational 2 days a week  Date of Surgery: 11/10/21  Type of Surgery: Thumb MPJ UCL  repair  Precautions: less 2# CCWB  Precaution From: 11/10/21  Precaution To: 12/21/21  Best Level of Pain: 0 - No pain  Worst Level of Pain: 1  Aggravating Factors: accidnetal contact or excessive motion  Relieving Factors: rest  Patient Outcome Tool: QuickDash  Outcome Score: 61.36  Body Part/Category: thumb  Based on screening, patient's fall risk is low.    Prior Level of Function  Level of Independence: Independent with ADLs and functional transfers  Lives With: Spouse  Receives Help From: Family  Ambulation: Independent  ADL Performance: Independent  Artist: Independent  Vocational: Retired  Leisure: Product manager , Scientist, forensic    Past Medical History:   Diagnosis Date    Abdominal pain     Anxiety     Arthritis     Asthma     Constipation     Depression     GERD (gastroesophageal reflux disease)     Headache     Heart palpitations     History of irritable bowel syndrome     Hormone disorder     Metatarsal stress fracture of right foot     TX FROM PAPER CHART      Primary insomnia     Squamous cell carcinoma in situ of skin  Thyroid disease     UTI (urinary tract infection)        Past Surgical History:   Procedure Laterality Date    PR UNLISTED PROCEDURE FOOT/TOES      PR UNLISTED PROCEDURE HANDS/FINGERS      PR UNLISTED PROCEDURE LEG/ANKLE      Tonsilectomy      Childhood    URETHRAL DILATION/DVIU         Social History     Social History Narrative    Not on file         ASSESSMENT    Patient is a 71 year old female who is s/p right thumb MCP joint UCL repair on 11/10/21, and is presenting with pain and stiffness of her right thumb with mild ulnar laxity. Patient is at risk for joint contracture and limited functional use of her right UE. 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     TREATMENT AND PLAN    Pain: The patient is reporting 1/10  level of pain, on a 0-10 Numeric Pain Distress Scale.    Skilled services/Interventions provided: Assessed current status and took ROM measurements. Incision is fully healed with dry skin around the area. Instructed per use of Aquaphor or other moisturizer that she can also use for scar massage. Performed and instructed per scar massage to break up adhesions, increase circulation and decrease pain. Instructed per AROM of her right thumb including IP and MCP joint motion, opposition. Instructed per post-surgical precautions and how these translate to ADLs and IADLs. Patient with mild ulnar laxity of her right thumb MCP joint. Re-molded custom thermoplastic orthosis (not the Orficast one) to better align MCP joint, and patient endorsed comfort and improved fit. Patient fit with Cica-Care silicone gel pad to help soften scar tissue, and instructed per wearing schedule. Patient verbalized understanding of all instructions today, and agreed with plan.     Range of Motion:   - Right tuhmb MCP 0-60, IP 0-55, opposition to small finger MCP joint    Strength: not tested due to precautions    Long-term functional goals: Patient able to grasp the pencil, paint brush and writing tool in her right hand for art work by 02/19/22.    Patient Education: verbal instructions and demonstrations were provided for the above exercises. The patient was able to demonstrate the exercises after instructions.    Interventions/Treatment provided:  Total Treatment Minutes: 45 min   Total Timed Code Minutes: 45 min     Therapeutic Procedures  PR MANUAL THERAPY TQS 1/> REGIONS EACH 15 MINUTES - total minutes: 15  PR THERAPEUTIC PX 1/> AREAS EACH 15 MIN EXERCISES - total minutes: 15  ORTHOTICS/PROSTH MGMT &/TRAINJ SBSQ ENCTR 15 MIN 46962 - Minutes: 15      Interventions for next therapy session: Treatment will consist of 1 visit(s) per week for 12 weeks, and will focus on progression of range of motion, strengthening and functional use of her right  hand: therapeutic exercises, manual therapy, scar management, orthotic management.       Mckinzy Fuller K, MOT, OTR/L CHT

## 2021-12-24 IMAGING — RF DX Urography Retrograde
10 series · 10 of 10 positions shown · non-contrast
Comparison: None

DX Urography Retrograde
HISTORY: CYSTOSCOPY BILATERAL RETROGRADE PYELOGRAM RIGHT URETEROSCOPY,                   
 RIGHT                                                                                     
 STENT.
TECHNIQUE: 10 intraoperative fluoroscopic spot images of the ureters were obtained.                  
 Fluoroscopy time: 192 seconds                                                             
 Radiation Exposure Index - Reference Air Kerma (in mGy):

[Series 1: 00/00 · <UNDEFINED> · 0.44mm/px · 1 of 1 slices shown (1 of 10)]
[im 1/1]
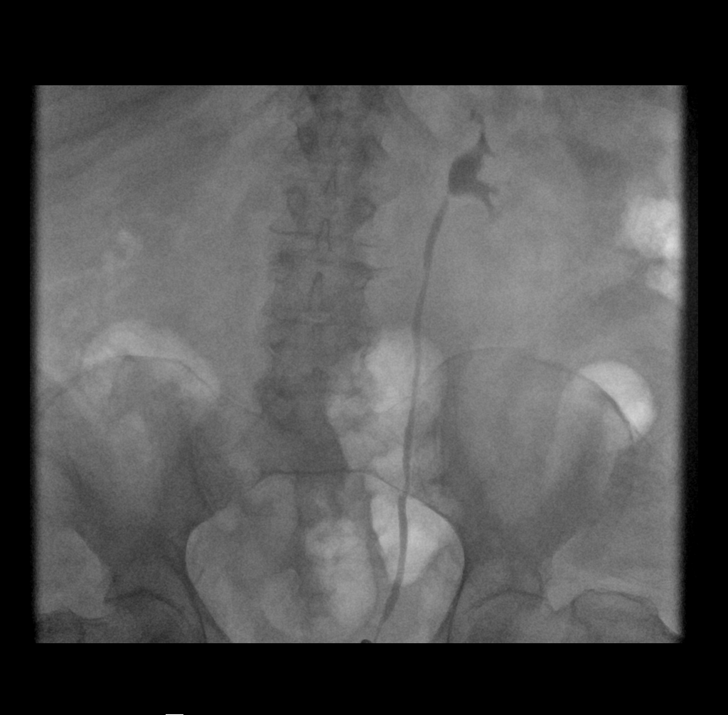

[Series 2: 00/00 · <UNDEFINED> · 0.44mm/px · 1 of 1 slices shown (2 of 10)]
[im 1/1]
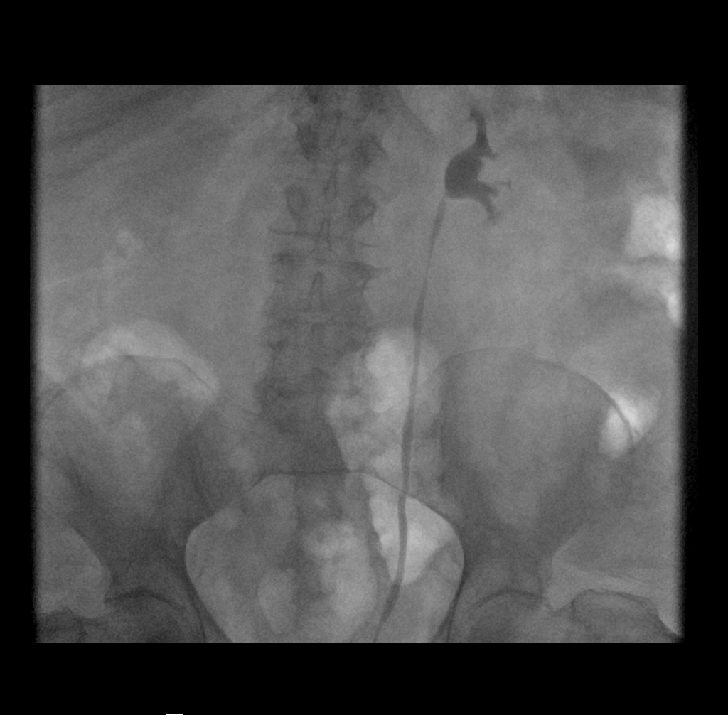

[Series 3: 00/00 · <UNDEFINED> · 0.44mm/px · 1 of 1 slices shown (3 of 10)]
[im 1/1]
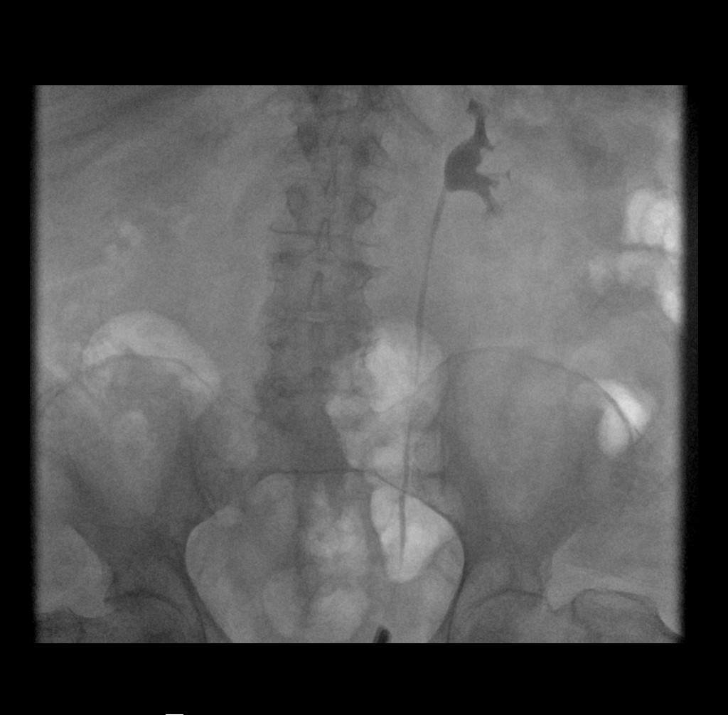

[Series 4: 00/00 · <UNDEFINED> · 0.44mm/px · 1 of 1 slices shown (4 of 10)]
[im 1/1]
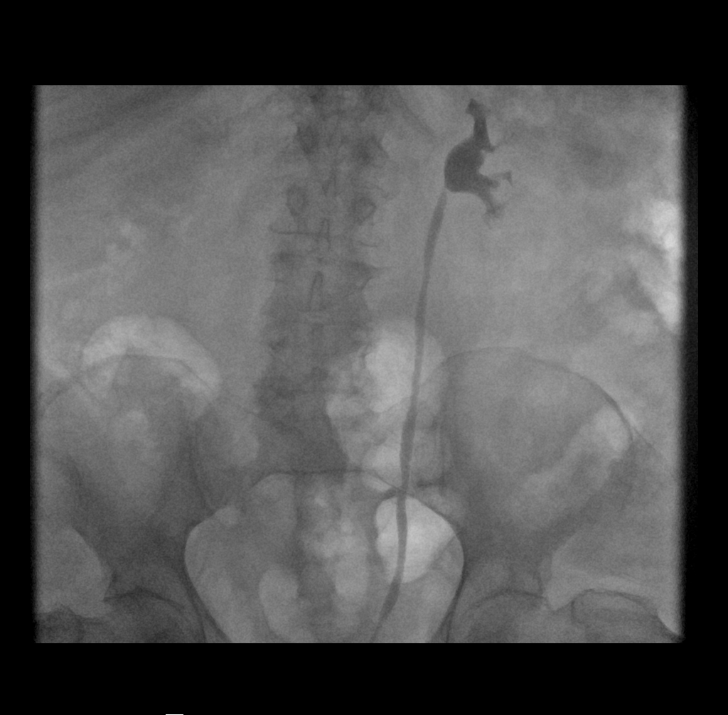

[Series 5: 00/00 · <UNDEFINED> · 0.44mm/px · 1 of 1 slices shown (5 of 10)]
[im 1/1]
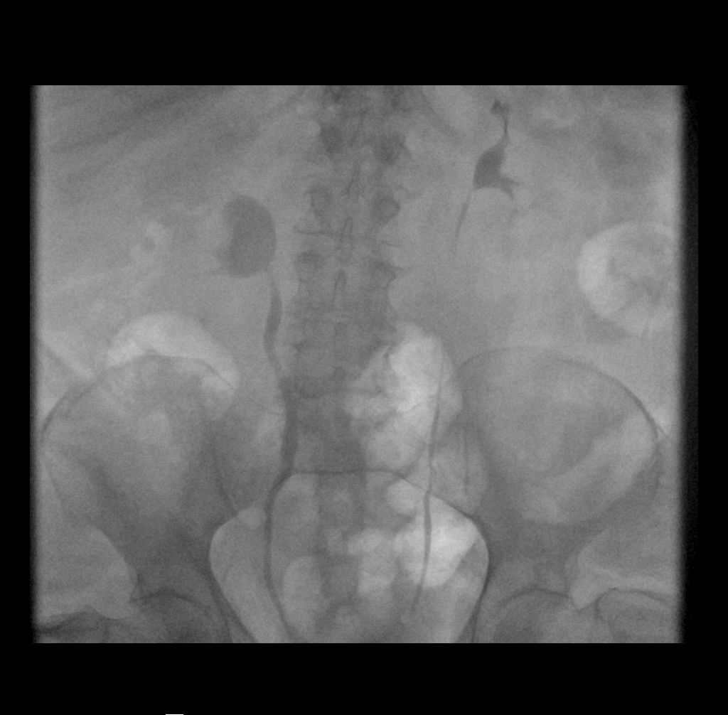

[Series 6: 00/00 · <UNDEFINED> · 0.44mm/px · 1 of 1 slices shown (6 of 10)]
[im 1/1]
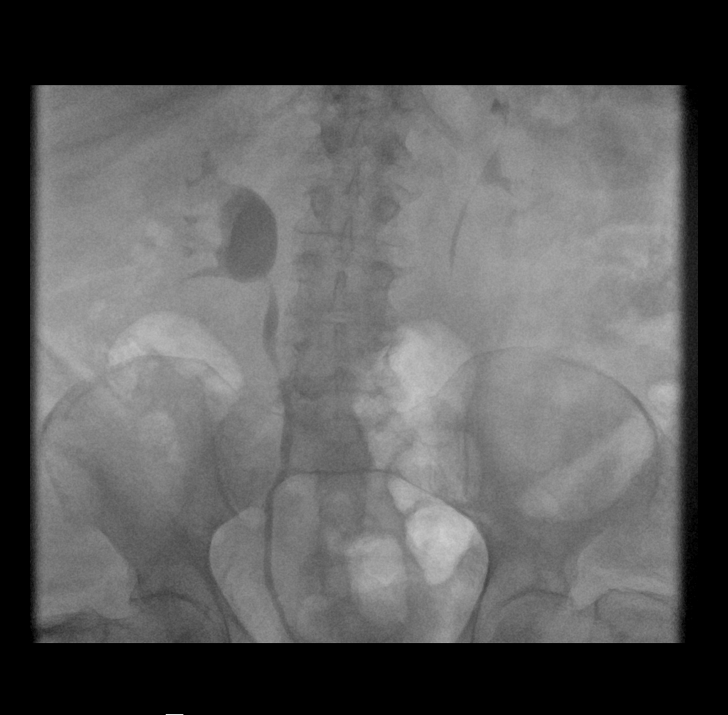

[Series 7: 00/00 · <UNDEFINED> · 0.44mm/px · 1 of 1 slices shown (7 of 10)]
[im 1/1]
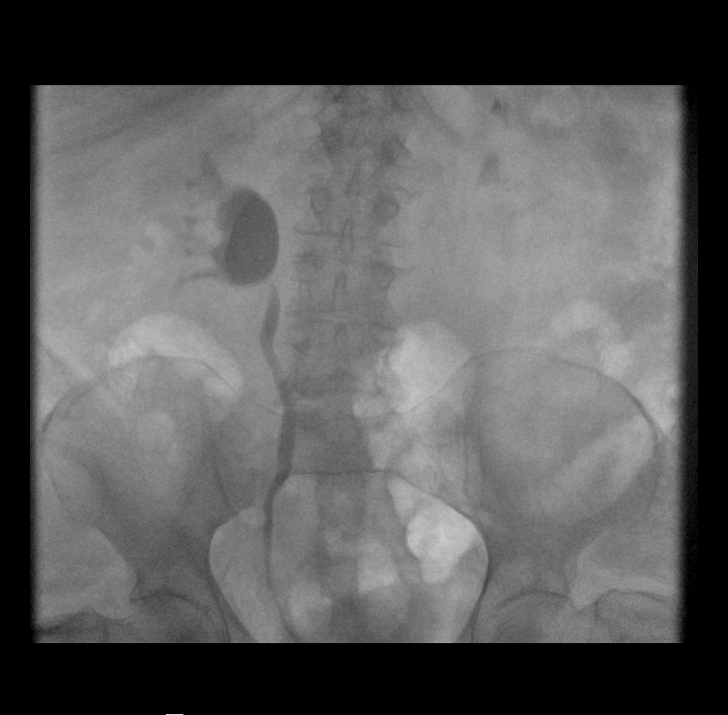

[Series 8: 00/00 · <UNDEFINED> · 0.44mm/px · 1 of 1 slices shown (8 of 10)]
[im 1/1]
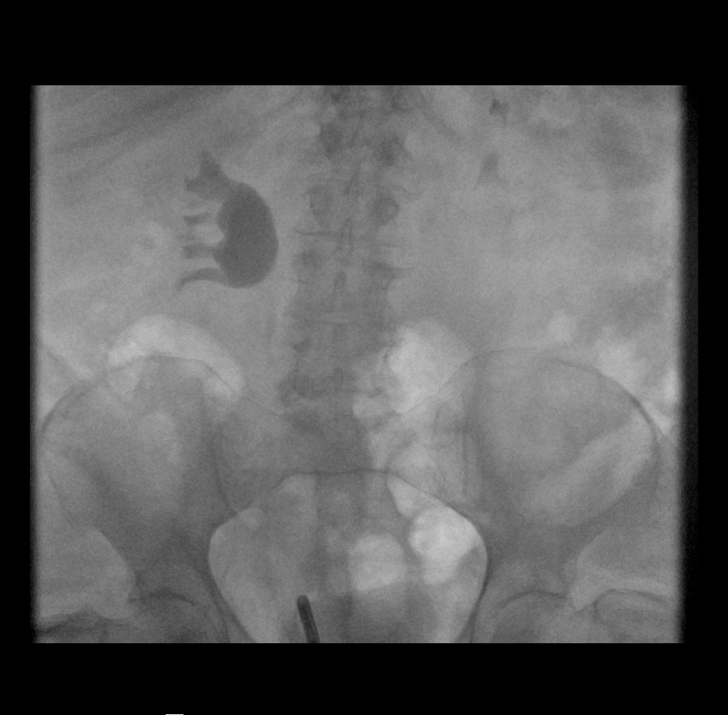

[Series 9: 00/00 · <UNDEFINED> · 0.44mm/px · 1 of 1 slices shown (9 of 10)]
[im 1/1]
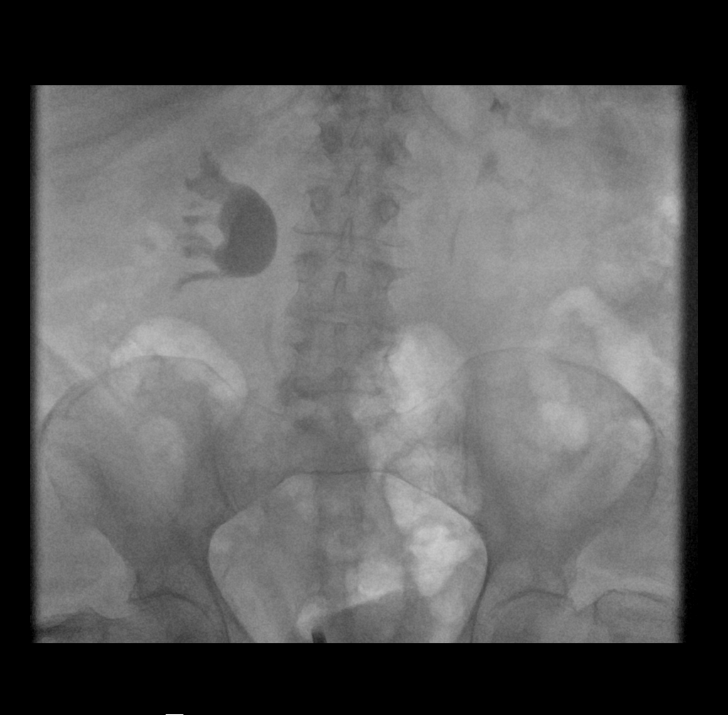

[Series 10: 00/00 · <UNDEFINED> · 0.44mm/px · 1 of 1 slices shown (10 of 10)]
[im 1/1]
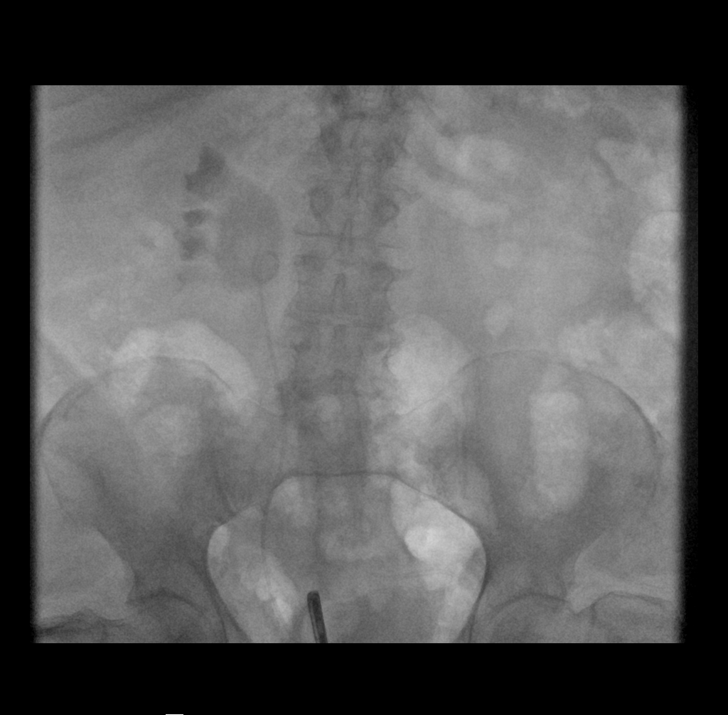

[10 of 10 positions shown; findings below may reference images not displayed]

FINDINGS: Questionable mild left UPJ obstruction. Right UPJ obstruction is noted.                   
 Right                                                                                     
 nephroureteral stent placement with appropriate drainage of urine/contrast.               
 Please see operative report for further detail.

## 2021-12-26 ENCOUNTER — Ambulatory Visit
Admission: RE | Admit: 2021-12-26 | Discharge: 2021-12-26 | Disposition: A | Discharge status: Home / Self Care | Payer: PPO | Source: Ambulatory Visit | Attending: Diagnostic Radiology | Admitting: Diagnostic Radiology

## 2021-12-26 ENCOUNTER — Ambulatory Visit (INDEPENDENT_AMBULATORY_CARE_PROVIDER_SITE_OTHER): Payer: PPO | Admitting: Hand Surgery

## 2021-12-26 ENCOUNTER — Ambulatory Visit (HOSPITAL_BASED_OUTPATIENT_CLINIC_OR_DEPARTMENT_OTHER): Admit: 2021-12-26 | Discharge: 2021-12-26 | Disposition: A | Discharge status: Home / Self Care | Payer: PPO

## 2021-12-26 ENCOUNTER — Other Ambulatory Visit (INDEPENDENT_AMBULATORY_CARE_PROVIDER_SITE_OTHER): Payer: Self-pay | Admitting: Hand Surgery

## 2021-12-26 ENCOUNTER — Ambulatory Visit: Payer: PPO | Attending: Orthopedic Surgery | Admitting: Rehabilitative and Restorative Service Providers"

## 2021-12-26 VITALS — BP 143/75 | HR 75 | Temp 97.2°F

## 2021-12-26 DIAGNOSIS — S63641D Sprain of metacarpophalangeal joint of right thumb, subsequent encounter: Secondary | ICD-10-CM | POA: Insufficient documentation

## 2021-12-26 DIAGNOSIS — G8929 Other chronic pain: Secondary | ICD-10-CM | POA: Insufficient documentation

## 2021-12-26 DIAGNOSIS — M79645 Pain in left finger(s): Secondary | ICD-10-CM

## 2021-12-26 DIAGNOSIS — M79641 Pain in right hand: Secondary | ICD-10-CM | POA: Insufficient documentation

## 2021-12-26 DIAGNOSIS — S63641A Sprain of metacarpophalangeal joint of right thumb, initial encounter: Secondary | ICD-10-CM | POA: Insufficient documentation

## 2021-12-26 DIAGNOSIS — Z4789 Encounter for other orthopedic aftercare: Secondary | ICD-10-CM

## 2021-12-26 DIAGNOSIS — M25641 Stiffness of right hand, not elsewhere classified: Secondary | ICD-10-CM | POA: Insufficient documentation

## 2021-12-26 NOTE — Progress Notes (Signed)
Cassie Contreras is a 71 year old female who is now 6 weeks s/p  R thumb UCL repair  DOS 11/10/2021    Patient has been in a removable thermoplast orthosis and working on ROM exercises. She rates her pain as 1/10. Patient also complains of pain in the left thumb. She notes increased pain in the right thumb as she's recovering from her R thumb surgery.    EXAM  Incision is well-healed over the R thumb  Sensation to light touch is intact over the radial/ulnar aspect of the R thumb  No radial or ulnar laxity R thumb MCP joint    Minimal tenderness and crepitus L thumb CMC joint  Mild hyperextension L thumb MCP joint  No radial or ulnar laxity MCP joint  Tender over L thumb IP joint      STUDIES  X-rays of the R thumb from 12/26/2021 show suture anchors in the thumb proximal phalanx ulnarly in good position with concentric reduction of the R thumb MCP joint. There is residual radial subluxation of the thumb MCP joint but no joint space widening ulnarly.  No change in alignment. No evidence of hardware loosening.    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 Doing well.  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  Referral to hand therapy for thumb spica orthosis  Active ROM.  Coffee cup (2 lbs) lifting restriction  Follow-up in clinic in 4 weeks  Repeat X-rays 3-views R thumb      Azucena Fallen, MD  Professor, Dept of Orthopaedics and Sports Medicine  [facility]  Hand, Wrist, and Elbow Surgery

## 2021-12-26 NOTE — Progress Notes (Signed)
OCCUPATIONAL THERAPY DAILY NOTE    Certification from 11/21/21 through 02/19/22    GENERAL VISIT INFO    Diagnosis: 71 year old female s/p right thumb MCP joint UCL repair on 11/10/21    Therapy diagnosis: pain and stiffness right thumb    Precautions: No strengthening for 3 months and No passive ROM for 6 weeks postop     History: Patient is a 71 year old right hand dominant female who was picking up a stack of dishes when she felt pain and instability in the right thumb. Surgery 11-10-2021 for repair of UCL thumb MPJ.  Lives in [place] with spouse. She also has support of daughter and son in law who live close by. She is retired and enjoys drawing and painting  She works out with a Merchant navy officer.    Encounter Diagnoses   Name Primary?    Rupture of ulnar collateral ligament of right thumb, initial encounter Yes    Right hand pain     Stiffness of finger joint of right hand     Aftercare following surgery of the musculoskeletal system      Referring Provider: Gardiner Rhyme, PA-C   Referring Provider NPI: [id number]  Reason for Referral: Please Evaluate and Treat, including: wound care with debridements, edema control, scar management, desensitization, and AROM of wrist and fingers.  Orthosis: thumb spica orthosis   Treatment frequency as determined by the Therapist.     VISIT COUNT   4    EVALUATION     General Assessment  Certification From*: 11/21/21  Certification To*: 02/19/22  Date of Symptom Onset*: 11/10/21  Start of Care Date*: 11/21/21  Reason for Referral*: Patient is a right hand dominant female who was picking up a stack of dishes when she felt pain and instability in the right thumb.  Surgery 11-10-2021 for repaijnr of UCL thumb MPJ.  Lives in [place] with spouse .  She also has suport of daughter and son in law who lives close by.  She is retired and enjoys drawing and painting  She works out with a Psychologist, educational 2 days a week  Date of Surgery: 11/10/21  Type of Surgery: Thumb MPJ UCL  repair  Precautions: less 2# CCWB  Precaution From: 11/10/21  Precaution To: 12/21/21  Best Level of Pain: 0 - No pain  Worst Level of Pain: 1  Aggravating Factors: accidnetal contact or excessive motion  Relieving Factors: rest  Patient Outcome Tool: QuickDash  Outcome Score: 61.36  Body Part/Category: thumb  Based on screening, patient's fall risk is low.    Prior Level of Function  Level of Independence: Independent with ADLs and functional transfers  Lives With: Spouse  Receives Help From: Family  Ambulation: Independent  ADL Performance: Independent  Artist: Independent  Vocational: Retired  Leisure: Product manager , Scientist, forensic    Past Medical History:   Diagnosis Date    Abdominal pain     Anxiety     Arthritis     Asthma     Constipation     Depression     GERD (gastroesophageal reflux disease)     Headache     Heart palpitations     History of irritable bowel syndrome     Hormone disorder     Metatarsal stress fracture of right foot     TX FROM PAPER CHART      Primary insomnia     Squamous cell carcinoma in situ of skin  Thyroid disease     UTI (urinary tract infection)        Past Surgical History:   Procedure Laterality Date    PR UNLISTED PROCEDURE FOOT/TOES      PR UNLISTED PROCEDURE HANDS/FINGERS      PR UNLISTED PROCEDURE LEG/ANKLE      Tonsilectomy      Childhood    URETHRAL DILATION/DVIU         Social History     Social History Narrative    Not on file         ASSESSMENT    Patient is a 71 year old female who is s/p right thumb MCP joint UCL repair on 11/10/21, and is presenting with pain and stiffness of her right thumb with mild ulnar laxity. Patient is at risk for joint contracture and limited functional use of her right UE. 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     TREATMENT AND PLAN    Pain: The patient is reporting 1/10  level of pain, on a 0-10 Numeric Pain Distress Scale.    Skilled services/Interventions provided: Patient seen today following appointment with Dr. Cristela Felt in [facility]. Assessed current status and took ROM measurements. Incision is fully healed with no more dry skin around the area. Performed and instructed per scar massage to break up adhesions, increase circulation and decrease pain. Instructed per A and gentle PROM (now that she is 6 weeks post-op) of her right thumb including IP and MCP joint motion, opposition. Instructed per post-surgical precautions and how these translate to ADLs and IADLs. Patient with mild ulnar laxity of her right thumb MCP joint. Re-molded custom thermoplastic orthosis again (just the thumb portion, and added moleskin to radial side of thumb) to better align MCP joint. Instructed per use of Cica-Care silicone gel pad to help soften scar tissue. Patient verbalized understanding of all instructions today, and agreed with plan.     Range of Motion:   - Right tuhmb MCP 0-65, IP 0-60, opposition to small finger MCP joint    Strength: not tested due to precautions    Long-term functional goals: Patient able to grasp the pencil, paint brush and writing tool in her right hand for art work by 02/19/22.    Patient Education: verbal instructions and demonstrations were provided for the above exercises. The patient was able to demonstrate the exercises after instructions.    Interventions/Treatment provided:  Total Treatment Minutes: 30 min   Total Timed Code Minutes: 30 min     Therapeutic Procedures  PR THERAPEUTIC PX 1/> AREAS EACH 15 MIN EXERCISES - total minutes: 15  ORTHOTICS/PROSTH MGMT &/TRAINJ SBSQ ENCTR 15 MIN 98264 - Minutes: 15      Interventions for next therapy session: Treatment will consist of 1 visit(s) per week for 12 weeks, and will focus on progression of range of motion, strengthening and functional use of her right hand: therapeutic exercises, manual therapy, scar  management, orthotic management.       Cassie Contreras K, MOT, OTR/L CHT

## 2021-12-29 ENCOUNTER — Other Ambulatory Visit (HOSPITAL_BASED_OUTPATIENT_CLINIC_OR_DEPARTMENT_OTHER): Payer: Self-pay | Admitting: Urology

## 2021-12-29 DIAGNOSIS — N390 Urinary tract infection, site not specified: Secondary | ICD-10-CM

## 2022-01-01 MED ORDER — ESTRADIOL 10 MCG VA TABS
10.0000 ug | ORAL_TABLET | VAGINAL | 0 refills | Status: DC
Start: 2022-01-02 — End: 2022-02-22

## 2022-01-02 ENCOUNTER — Other Ambulatory Visit (HOSPITAL_BASED_OUTPATIENT_CLINIC_OR_DEPARTMENT_OTHER): Payer: Self-pay | Admitting: Urology

## 2022-01-02 DIAGNOSIS — N390 Urinary tract infection, site not specified: Secondary | ICD-10-CM

## 2022-01-04 ENCOUNTER — Ambulatory Visit (HOSPITAL_BASED_OUTPATIENT_CLINIC_OR_DEPARTMENT_OTHER): Payer: PPO | Admitting: Rehabilitative and Restorative Service Providers"

## 2022-01-04 DIAGNOSIS — M25641 Stiffness of right hand, not elsewhere classified: Secondary | ICD-10-CM

## 2022-01-04 DIAGNOSIS — S63641D Sprain of metacarpophalangeal joint of right thumb, subsequent encounter: Secondary | ICD-10-CM

## 2022-01-04 DIAGNOSIS — M79641 Pain in right hand: Secondary | ICD-10-CM

## 2022-01-04 NOTE — Progress Notes (Addendum)
OCCUPATIONAL THERAPY DAILY NOTE    Certification from 11/21/21 through 02/19/22    GENERAL VISIT INFO    Diagnosis: 71 year old female s/p right thumb MCP joint UCL repair on 11/10/21    Therapy diagnosis: pain and stiffness right thumb    Precautions: No strengthening for 3 months and No passive ROM for 6 weeks postop     History: Patient is a 71 year old right hand dominant female who was picking up a stack of dishes when she felt pain and instability in the right thumb. Surgery 11-10-2021 for repair of UCL thumb MPJ.  Lives in [place] with spouse. She also has support of daughter and son in law who live close by. She is retired and enjoys drawing and painting  She works out with a Merchant navy officer.    Encounter Diagnoses   Name Primary?    Rupture of ulnar collateral ligament of right thumb, initial encounter Yes    Right hand pain     Stiffness of finger joint of right hand     Aftercare following surgery of the musculoskeletal system      Referring Provider: Gardiner Rhyme, PA-C   Referring Provider NPI: [id number]  Reason for Referral: Please Evaluate and Treat, including: wound care with debridements, edema control, scar management, desensitization, and AROM of wrist and fingers.  Orthosis: thumb spica orthosis   Treatment frequency as determined by the Therapist.     VISIT COUNT   4    EVALUATION     General Assessment  Certification From*: 11/21/21  Certification To*: 02/19/22  Date of Symptom Onset*: 11/10/21  Start of Care Date*: 11/21/21  Reason for Referral*: Patient is a right hand dominant female who was picking up a stack of dishes when she felt pain and instability in the right thumb.  Surgery 11-10-2021 for repaijnr of UCL thumb MPJ.  Lives in [place] with spouse .  She also has suport of daughter and son in law who lives close by.  She is retired and enjoys drawing and painting  She works out with a Psychologist, educational 2 days a week  Date of Surgery: 11/10/21  Type of Surgery: Thumb MPJ UCL  repair  Precautions: less 2# CCWB  Precaution From: 11/10/21  Precaution To: 12/21/21  Best Level of Pain: 0 - No pain  Worst Level of Pain: 1  Aggravating Factors: accidnetal contact or excessive motion  Relieving Factors: rest  Patient Outcome Tool: QuickDash  Outcome Score: 61.36  Body Part/Category: thumb  Based on screening, patient's fall risk is low.    Prior Level of Function  Level of Independence: Independent with ADLs and functional transfers  Lives With: Spouse  Receives Help From: Family  Ambulation: Independent  ADL Performance: Independent  Artist: Independent  Vocational: Retired  Leisure: Product manager , Scientist, forensic    Past Medical History:   Diagnosis Date    Abdominal pain     Anxiety     Arthritis     Asthma     Constipation     Depression     GERD (gastroesophageal reflux disease)     Headache     Heart palpitations     History of irritable bowel syndrome     Hormone disorder     Metatarsal stress fracture of right foot     TX FROM PAPER CHART      Primary insomnia     Squamous cell carcinoma in situ of skin  Thyroid disease     UTI (urinary tract infection)        Past Surgical History:   Procedure Laterality Date    PR UNLISTED PROCEDURE FOOT/TOES      PR UNLISTED PROCEDURE HANDS/FINGERS      PR UNLISTED PROCEDURE LEG/ANKLE      Tonsilectomy      Childhood    URETHRAL DILATION/DVIU         Social History     Social History Narrative    Not on file         ASSESSMENT    Patient is a 71 year old female who is s/p right thumb MCP joint UCL repair on 11/10/21, and is presenting with pain and stiffness of her right thumb with mild ulnar laxity. Patient is at risk for joint contracture and limited functional use of her right UE. 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     TREATMENT AND PLAN    Pain: The patient is reporting 1/10  level of pain, on a 0-10 Numeric Pain Distress Scale.    Skilled services/Interventions provided: Assessed current status and took ROM measurements. Incision is fully healed with no more dry skin around the area. Performed and instructed per scar massage to break up adhesions, increase circulation and decrease pain. Instructed per A and gentle PROM of her right thumb including IP and MCP joint motion, opposition. Instructed per post-surgical precautions and how these translate to ADLs and IADLs. Patient with mild ulnar laxity of her right thumb MCP joint. Instructed per use of custom thermoplastic orthosis for protection during heavy/risky activities. Instructed per use of Cica-Care silicone gel pad to help soften scar tissue. Patient verbalized understanding of all instructions today, and agreed with plan.     Range of Motion:   - Right tuhmb MCP 0-70, IP 0-60, opposition to small finger MCP joint    Strength: not tested due to precautions    Long-term functional goals: Patient able to grasp the pencil, paint brush and writing tool in her right hand for art work by 02/19/22.    Patient Education: verbal instructions and demonstrations were provided for the above exercises. The patient was able to demonstrate the exercises after instructions.    Interventions/Treatment provided:  Total Treatment Minutes: 30 min   Total Timed Code Minutes: 30 min     Therapeutic Procedures  PR MANUAL THERAPY TQS 1/> REGIONS EACH 15 MINUTES - total minutes: 15  PR THERAPEUTIC PX 1/> AREAS EACH 15 MIN EXERCISES - total minutes: 15      Interventions for next therapy session: Treatment will consist of 1 visit(s) per week for 12 weeks, and will focus on progression of range of motion, strengthening and functional use of her right hand: therapeutic exercises, manual therapy, scar management, orthotic management.       Jilliann Subramanian K, MOT, OTR/L CHT

## 2022-01-05 ENCOUNTER — Other Ambulatory Visit (INDEPENDENT_AMBULATORY_CARE_PROVIDER_SITE_OTHER): Payer: Self-pay | Admitting: Family Medicine

## 2022-01-05 DIAGNOSIS — L719 Rosacea, unspecified: Secondary | ICD-10-CM

## 2022-01-06 ENCOUNTER — Ambulatory Visit (INDEPENDENT_AMBULATORY_CARE_PROVIDER_SITE_OTHER): Payer: PPO | Admitting: Podiatrist

## 2022-01-06 VITALS — BP 115/56 | HR 68 | Temp 97.9°F

## 2022-01-06 DIAGNOSIS — M76822 Posterior tibial tendinitis, left leg: Secondary | ICD-10-CM

## 2022-01-06 DIAGNOSIS — M958 Other specified acquired deformities of musculoskeletal system: Secondary | ICD-10-CM

## 2022-01-06 DIAGNOSIS — M85679 Other cyst of bone, unspecified ankle and foot: Secondary | ICD-10-CM

## 2022-01-06 MED ORDER — CLINDAMYCIN PHOS (TWICE-DAILY) 1 % EX GEL
Freq: Two times a day (BID) | CUTANEOUS | 1 refills | Status: DC
Start: 2022-01-06 — End: 2023-04-12

## 2022-01-06 NOTE — Patient Instructions (Signed)
Dr. Selena Batten has ordered a CT SPECT  Our staff will notify you once we obtain authorization from insurance company.   When you are scheduling for imaging/study, please also schedule an appointment with Dr. Selena Batten to review.    If you do not receive a call from imaging department, please give them a call at [phone] [facility] radiology).

## 2022-01-06 NOTE — Progress Notes (Signed)
Patient: Cassie Contreras   Patient DOB: May 09, 1950     DOS:  01/06/2022     Accompanied by:  patient was unaccompanied    Subjective:  (history is obtained by MA/RN and edited by Dr. Selena Batten)  Cassie Contreras is a 71 year old year old female who presents today for re-evaluation of left ankle after injection under fluro on 12/08/21.    The patient states overall mark improvement.     The current condition has existed for 3 month(s).    Patient describes the symptoms as  none  .     The level the pain/severity of pain is none.   The condition is worse when none. Leg press   felt a "twinch"      Current treatments/recent studies include cortisone injection, orthotic, Hoka Bondi, ankle brace.     The response to the treatment is marked improvement.     Patient's activity limitation is none.      Goals: evaluate current condition    PCP:  [name], MD     PMH:   Active Ambulatory Problems     Diagnosis Date Noted    Os peroneum syndrome of left foot     S/P foot & ankle surgery, left 08/01/2012    Neuritis of left sural nerve 09/29/2014    Recurrent UTI 11/10/2015    Osteopenia of spine 11/10/2015    Atrophic vaginitis 11/15/2015    Hormone replacement therapy 11/15/2015    Thyroid nodule 03/22/2017    Mixed incontinence 04/20/2017    Pelvic floor weakness 04/20/2017    Obstructive sleep apnea 01/11/2018    Benign hypertension 02/20/2019    Anxiety 02/20/2019    Breast lump on right side at 11 o'clock position 02/20/2019    Migraine with aura and without status migrainosus, not intractable 01/29/2020    New onset of headaches after age 18 02/05/2020    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) 04/07/2020    Rupture of ulnar collateral ligament of right thumb 09/12/2021    Right hand pain 11/21/2021    Stiffness of finger joint of right hand 11/21/2021     Resolved Ambulatory Problems     Diagnosis Date Noted    Stress reaction of tibia     Left ankle pain     Metatarsal stress fracture of right foot     Ankle  impingement syndrome 12/02/2012    Stress fracture of metatarsal bone 12/02/2012    Ingrown nail,fibular border hallux, left 10/20/2013    Palpitations 11/10/2015    Dysphonia 04/05/2017     Past Medical History:   Diagnosis Date    Abdominal pain     Arthritis     Asthma     Constipation     Depression     GERD (gastroesophageal reflux disease)     Headache     Heart palpitations     History of irritable bowel syndrome     Hormone disorder     Primary insomnia     Squamous cell carcinoma in situ of skin     Thyroid disease     UTI (urinary tract infection)         Review of patient's allergies indicates:  Allergies   Allergen Reactions    Erythromycin Other     Strange smells but no rash or trouble breathing.    Ibuprofen      TX FROM PAPER CHART  Pneumovax [Pneumococcal Polysaccharide Vaccine]      Rash/fever (arm)     Sulfa Antibiotics      TX FROM PAPER CHART          Medications:   Outpatient Medications Prior to Visit   Medication Sig Dispense Refill    acetaminophen 500 MG tablet Take 2 tablets (1,000 mg) by mouth every 8 hours as needed for pain. 50 tablet 3    bimatoprost (Latisse) 0.03 % topical solution Apply 1 application topically at bedtime. Apply upper eyelid at base of eyelashes with applicator. Blot excess solution. 3 mL 0    clindamycin 1 % gel Apply topically 2 times a day. Apply to face. 30 g 1    doxycycline monohydrate 100 MG capsule Take 1 capsule (100 mg) by mouth daily. 90 capsule 3    estradiol 10 MCG vaginal tablet insert 1 tablet vaginally two times a week 24 tablet 0    memantine 10 MG tablet Take 1 tablet (10 mg) by mouth daily. 30 tablet 11    omeprazole 20 MG DR capsule take 1 capsule by mouth once daily ON AN EMPTY STOMACH 90 capsule 2    ondansetron 4 MG tablet Take 1 tablet (4 mg) by mouth every 8 hours as needed for nausea/vomiting. 20 tablet 0    oxyCODONE 5 MG tablet Take 0.5-1 tablets (2.5-5 mg) by mouth every 6 hours as needed for severe pain. 20 tablet 0    senna 8.6 MG  tablet Take 1 tablet (8.6 mg) by mouth 2 times a day to prevent constipation while on opioids. 20 tablet 0    tretinoin 0.05 % cream APPLY TO AFFECTED AREA(S) ON FACE AT BEDTIME 20 g 1    valACYclovir 500 MG tablet Take 1 tablet (500 mg) by mouth daily. 30 tablet 1     No facility-administered medications prior to visit.        PSH:   Past Surgical History:   Procedure Laterality Date    PR UNLISTED PROCEDURE FOOT/TOES      PR UNLISTED PROCEDURE HANDS/FINGERS      PR UNLISTED PROCEDURE LEG/ANKLE      Tonsilectomy      Childhood    URETHRAL DILATION/DVIU          Family History:  family history includes Heart Attack (age of onset: 36) in her sister.    Social History:  Social History     Socioeconomic History    Marital status: Married     Spouse name: Not on file    Number of children: Not on file    Years of education: Not on file    Highest education level: Not on file   Occupational History    Not on file   Tobacco Use    Smoking status: Former    Smokeless tobacco: Never   Substance and Sexual Activity    Alcohol use: Yes     Alcohol/week: 1.0 standard drink of alcohol     Types: 1 Glasses of wine per week     Comment: occ.    Drug use: No    Sexual activity: Yes     Partners: Male   Other Topics Concern    Not on file   Social History Narrative    Not on file     Social Determinants of Health     Financial Resource Strain: Not on file   Food Insecurity: Not on file   Transportation Needs: Not on file  Physical Activity: Not on file   Stress: Not on file   Social Connections: Not on file   Intimate Partner Violence: Not on file   Housing Stability: Not on file        Physical Exam:   Vitals:    01/06/22 0933   Temp: 36.6 C   Pulse: 68   BP: (!) 115/56   SpO2: 97%        General: The patient is 71 year old year old female White  with constitutional symptom: appears age stated.    Psychiatric/mood: normal mood and affect.    Neurological: alert and oriented x3.    Vascular:   LEFT:  Dorsalis pedis pulse is  palpable.  Posterior tibial pulse is palpable. Capillary filling time note to be immediate  on the distal hallux.      Neurologic:  LEFT:  The sensation is intact on the foot and ankle.  Motor coordination is intact with normal muscle tone.  There is no manifestations of pathological reflexes noted.      Dermatological:   LEFT:  Upon inspection, the skin note to have normal texture/tone/turgor.  There is no ulceration/open wound or dermatosis present.  Integument coloration is within normal range and temperature is within normal range.  The hair growth is normal.      Musculoskeletal:   LEFT:  There is mild pain on palpation at the posterior tibialis tendon on posterior to medial malleolus.  There is no swelling along the tendon.  There is no warmth to touch, ecchymosis, weakness, and muscle herniation.   There is no pain on palpation along the medial gutter of the ankle joint and .  There is no swelling noted over the affect region. There is no warmth to touch, ecchymosis, swelling, crepitation on ROM, and limited ROM on the affected area.        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  It appear she had significant relief of pain after injecting on the posterior tibialis.  Overall her source pain is still not crystal clear.  Answered all of patient's questions.  We have propose following treatment options: continue with current care, CT SPECT to delineate osseous origin of pain and surgery.  The patient elected to try CT SPECT to delineate osseous origin of pain.       We have reviewed previous X-ray and MRI finding and detail discussion took place with the patient.       ORDERS:  CT SPECT left  ankle .       Return appointment: follow up for CT SPECT review    Randa Spike DPM, FACFAS  Reconstructive Ankle and Foot Surgeon  [facility]

## 2022-01-06 NOTE — Progress Notes (Signed)
Cassie Contreras is a 71 year old year old female who presents today for re-evaluation of left ankle after injection under fluro on 12/08/21.    The patient states overall improvement.     The current condition has existed for 3 month(s).    Patient describes the symptoms as  none  .     The level the pain/severity of pain is none.   The condition is worse when none. Leg press   felt a "twinch"      Current treatments/recent studies include cortisone injection.     The response to the treatment is marked improvement.     Patient's activity limitation is none.

## 2022-01-13 ENCOUNTER — Encounter (INDEPENDENT_AMBULATORY_CARE_PROVIDER_SITE_OTHER): Payer: Self-pay | Admitting: Podiatrist

## 2022-01-17 NOTE — Addendum Note (Signed)
Addended by: Jon Billings on: 01/17/2022 08:39 AM     Modules accepted: Orders

## 2022-01-24 ENCOUNTER — Encounter (INDEPENDENT_AMBULATORY_CARE_PROVIDER_SITE_OTHER): Payer: Self-pay

## 2022-01-24 NOTE — Progress Notes (Signed)
Called patient, let her know her insurance denied the spect scan, we needed to do a peer to peer, which we did , and they approved it, Josem Kaufmann # is [phone]

## 2022-01-25 ENCOUNTER — Ambulatory Visit: Payer: PPO | Attending: Orthopedic Surgery | Admitting: Rehabilitative and Restorative Service Providers"

## 2022-01-25 ENCOUNTER — Inpatient Hospital Stay (HOSPITAL_COMMUNITY)
Admit: 2022-01-25 | Discharge: 2022-01-25 | Disposition: A | Discharge status: Home / Self Care | Payer: PPO | Source: Home / Self Care

## 2022-01-25 ENCOUNTER — Inpatient Hospital Stay
Admission: RE | Admit: 2022-01-25 | Discharge: 2022-01-25 | Disposition: A | Discharge status: Home / Self Care | Payer: PPO | Attending: Nuclear Medicine | Admitting: Nuclear Medicine

## 2022-01-25 DIAGNOSIS — S63641A Sprain of metacarpophalangeal joint of right thumb, initial encounter: Secondary | ICD-10-CM | POA: Insufficient documentation

## 2022-01-25 DIAGNOSIS — Z4789 Encounter for other orthopedic aftercare: Secondary | ICD-10-CM | POA: Insufficient documentation

## 2022-01-25 DIAGNOSIS — M25641 Stiffness of right hand, not elsewhere classified: Secondary | ICD-10-CM | POA: Insufficient documentation

## 2022-01-25 DIAGNOSIS — M25572 Pain in left ankle and joints of left foot: Secondary | ICD-10-CM | POA: Insufficient documentation

## 2022-01-25 DIAGNOSIS — M85679 Other cyst of bone, unspecified ankle and foot: Secondary | ICD-10-CM | POA: Insufficient documentation

## 2022-01-25 DIAGNOSIS — S63641D Sprain of metacarpophalangeal joint of right thumb, subsequent encounter: Secondary | ICD-10-CM | POA: Insufficient documentation

## 2022-01-25 DIAGNOSIS — M76822 Posterior tibial tendinitis, left leg: Secondary | ICD-10-CM | POA: Insufficient documentation

## 2022-01-25 DIAGNOSIS — M79641 Pain in right hand: Secondary | ICD-10-CM | POA: Insufficient documentation

## 2022-01-25 DIAGNOSIS — M958 Other specified acquired deformities of musculoskeletal system: Secondary | ICD-10-CM | POA: Insufficient documentation

## 2022-01-25 MED ORDER — TECHNETIUM TC-99M MEDRONATE (MDP) UWM SOLUTION
25.0000 | Freq: Once | Status: AC | PRN
Start: 2022-01-25 — End: 2022-01-25
  Administered 2022-01-25: 24 via INTRAVENOUS

## 2022-01-25 NOTE — Progress Notes (Addendum)
OCCUPATIONAL THERAPY DAILY NOTE    Certification from 24/23/53 through 02/19/22    GENERAL VISIT INFO    Diagnosis: 72 year old female s/p right thumb MCP joint UCL repair on 11/10/21    Therapy diagnosis: pain and stiffness right thumb    Precautions: No strengthening for 3 months and No passive ROM for 6 weeks postop     History: Patient is a 72 year old right hand dominant female who was picking up a stack of dishes when she felt pain and instability in the right thumb. Surgery 11-10-2021 for repair of UCL thumb MPJ.  Lives in [place] with spouse. She also has support of daughter and son in law who live close by. She is retired and enjoys drawing and painting  She works out with a Clinical biochemist.    Encounter Diagnoses   Name Primary?    Rupture of ulnar collateral ligament of right thumb, subsequent encounter Yes    Right hand pain     Stiffness of finger joint of right hand      Referring Provider: Verlon Setting, PA-C   Referring Provider NPI: [id number]  Reason for Referral: Please Evaluate and Treat, including: wound care with debridements, edema control, scar management, desensitization, and AROM of wrist and fingers.  Orthosis: thumb spica orthosis   Treatment frequency as determined by the Therapist.     VISIT COUNT   4    EVALUATION     General Assessment  Certification From*: 86/76/19  Certification To*: 50/93/26  Date of Symptom Onset*: 11/10/21  Start of Care Date*: 11/21/21  Reason for Referral*: Patient is a right hand dominant female who was picking up a stack of dishes when she felt pain and instability in the right thumb.  Surgery 11-10-2021 for repaijnr of UCL thumb MPJ.  Lives in [place] with spouse .  She also has suport of daughter and son in law who lives close by.  She is retired and enjoys drawing and painting  She works out with a Clinical research associate 2 days a week  Date of Surgery: 11/10/21  Type of Surgery: Thumb MPJ UCL repair  Precautions: less 2# CCWB  Precaution From:  11/10/21  Precaution To: 12/21/21  Best Level of Pain: 0 - No pain  Worst Level of Pain: 1  Aggravating Factors: accidnetal contact or excessive motion  Relieving Factors: rest  Patient Outcome Tool: QuickDash  Outcome Score: 61.36  Body Part/Category: thumb  Based on screening, patient's fall risk is low.    Prior Level of Function  Level of Independence: Independent with ADLs and functional transfers  Lives With: Spouse  Receives Help From: Family  Ambulation: Independent  ADL Performance: Independent  Surveyor, minerals: Independent  Vocational: Retired  Leisure: Geographical information systems officer , Haematologist    Past Medical History:   Diagnosis Date    Abdominal pain     Anxiety     Arthritis     Asthma     Constipation     Depression     GERD (gastroesophageal reflux disease)     Headache     Heart palpitations     History of irritable bowel syndrome     Hormone disorder     Metatarsal stress fracture of right foot     TX FROM PAPER CHART      Primary insomnia     Squamous cell carcinoma in situ of skin     Thyroid disease     UTI (urinary tract  infection)        Past Surgical History:   Procedure Laterality Date    PR UNLISTED PROCEDURE FOOT/TOES      PR UNLISTED PROCEDURE HANDS/FINGERS      PR UNLISTED PROCEDURE LEG/ANKLE      Tonsilectomy      Childhood    URETHRAL DILATION/DVIU         Social History     Social History Narrative    Not on file         ASSESSMENT    Patient is a 72 year old female who is s/p right thumb MCP joint UCL repair on 11/10/21, and is presenting with mild pain and stiffness of her right thumb with mild ulnar laxity. Patient is at risk for joint contracture and limited functional use of her right UE. 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     TREATMENT AND PLAN    Pain: The patient is reporting 1/10 level of pain, on a 0-10 Numeric Pain Distress  Scale.    Subjective: Patient reports her orthosis is fitting her well, and she wears it at night and at the gym. She would like to get back into painting, but has difficulty holding a pen/brush.     Skilled services/Interventions provided: Assessed current status and took ROM measurements. Performed and instructed per scar massage to break up adhesions, increase circulation and decrease pain. Instructed per A and gentle PROM of her right thumb including IP and MCP joint motion, opposition. Instructed per post-surgical precautions and how these translate to ADLs and IADLs. Patient with mild ulnar laxity of her right thumb MCP joint. Instructed per use of custom thermoplastic orthosis for protection during heavy/risky activities. Instructed per use of Cica-Care silicone gel pad to help soften scar tissue. Instructed per use of cylindrical foam for built-up handle for greater ease with handwriting and painting. Patient verbalized understanding of all instructions today, and agreed with plan.     Range of Motion:   - Right tuhmb MCP 0-70, IP 0-65, opposition to small finger MCP joint    Strength: not tested due to precautions    Long-term functional goals: Patient able to grasp the pencil, paint brush and writing tool in her right hand for art work by 02/19/22.    Patient Education: verbal instructions and demonstrations were provided for the above exercises. The patient was able to demonstrate the exercises after instructions.    Interventions/Treatment provided:  Total Treatment Minutes: 15 min   Total Timed Code Minutes: 15 min     Therapeutic Procedures  PR THERAPEUTIC PX 1/> AREAS EACH 15 MIN EXERCISES - total minutes: 15      Interventions for next therapy session: Treatment will consist of 1 visit(s) per week for 12 weeks, and will focus on progression of range of motion, strengthening and functional use of her right hand: therapeutic exercises, manual therapy, scar management, orthotic management. Patient will  follow up with Dr. Carmina Miller and OT on 02/13/22, and will begin strengthening at that time if appropriate.       Luvern Mischke K, MOT, OTR/L CHT

## 2022-01-26 ENCOUNTER — Encounter (INDEPENDENT_AMBULATORY_CARE_PROVIDER_SITE_OTHER): Payer: Self-pay | Admitting: Podiatrist

## 2022-02-01 ENCOUNTER — Ambulatory Visit (INDEPENDENT_AMBULATORY_CARE_PROVIDER_SITE_OTHER): Payer: PPO | Admitting: Podiatrist

## 2022-02-01 VITALS — BP 123/71 | HR 72 | Temp 99.4°F

## 2022-02-01 DIAGNOSIS — M85679 Other cyst of bone, unspecified ankle and foot: Secondary | ICD-10-CM

## 2022-02-01 DIAGNOSIS — M958 Other specified acquired deformities of musculoskeletal system: Secondary | ICD-10-CM

## 2022-02-01 DIAGNOSIS — M76822 Posterior tibial tendinitis, left leg: Secondary | ICD-10-CM

## 2022-02-01 NOTE — Progress Notes (Signed)
Patient: Cassie Contreras   Patient DOB: 1950-11-09     DOS:  02/01/2022     Accompanied by:  patient was unaccompanied    Subjective:  (history is obtained by MA/RN and edited by Dr. Selena Batten)  Cassie Contreras is a 72 year old year old female who presents today for  SPECT-CT scan review  of left ankle.    The patient states remained unchanged, doing good little sore.     The current condition has existed for 4 month(s).    Patient describes the symptoms as  none .     The level the pain/severity of pain is none.   The condition is worse when none.     Current treatments/recent studies include  ankle brace, Hoka bondi, cortisone injection, and orthotic.     The response to the treatment is moderate improvement.     Patient's activity limitation is none.      Goals: evaluate current condition    PCP:  [name], MD     PMH:   Active Ambulatory Problems     Diagnosis Date Noted    Os peroneum syndrome of left foot     S/P foot & ankle surgery, left 08/01/2012    Neuritis of left sural nerve 09/29/2014    Recurrent UTI 11/10/2015    Osteopenia of spine 11/10/2015    Atrophic vaginitis 11/15/2015    Hormone replacement therapy 11/15/2015    Thyroid nodule 03/22/2017    Mixed incontinence 04/20/2017    Pelvic floor weakness 04/20/2017    Obstructive sleep apnea 01/11/2018    Benign hypertension 02/20/2019    Anxiety 02/20/2019    Breast lump on right side at 11 o'clock position 02/20/2019    Migraine with aura and without status migrainosus, not intractable 01/29/2020    New onset of headaches after age 48 02/05/2020    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) 04/07/2020    Rupture of ulnar collateral ligament of right thumb 09/12/2021    Right hand pain 11/21/2021    Stiffness of finger joint of right hand 11/21/2021     Resolved Ambulatory Problems     Diagnosis Date Noted    Stress reaction of tibia     Left ankle pain     Metatarsal stress fracture of right foot     Ankle impingement syndrome 12/02/2012     Stress fracture of metatarsal bone 12/02/2012    Ingrown nail,fibular border hallux, left 10/20/2013    Palpitations 11/10/2015    Dysphonia 04/05/2017     Past Medical History:   Diagnosis Date    Abdominal pain     Arthritis     Asthma     Constipation     Depression     GERD (gastroesophageal reflux disease)     Headache     Heart palpitations     History of irritable bowel syndrome     Hormone disorder     Primary insomnia     Squamous cell carcinoma in situ of skin     Thyroid disease     UTI (urinary tract infection)         Review of patient's allergies indicates:  Allergies   Allergen Reactions    Erythromycin Other     Strange smells but no rash or trouble breathing.    Ibuprofen      TX FROM PAPER CHART      Pneumovax [Pneumococcal Polysaccharide Vaccine]  Rash/fever (arm)     Sulfa Antibiotics      TX FROM PAPER CHART          Medications:   Outpatient Medications Prior to Visit   Medication Sig Dispense Refill    acetaminophen 500 MG tablet Take 2 tablets (1,000 mg) by mouth every 8 hours as needed for pain. 50 tablet 3    bimatoprost (Latisse) 0.03 % topical solution Apply 1 application topically at bedtime. Apply upper eyelid at base of eyelashes with applicator. Blot excess solution. 3 mL 0    clindamycin 1 % gel Apply topically 2 times a day. Apply to face. 30 g 1    doxycycline monohydrate 100 MG capsule Take 1 capsule (100 mg) by mouth daily. 90 capsule 3    estradiol 10 MCG vaginal tablet insert 1 tablet vaginally two times a week 24 tablet 0    memantine 10 MG tablet Take 1 tablet (10 mg) by mouth daily. 30 tablet 11    omeprazole 20 MG DR capsule take 1 capsule by mouth once daily ON AN EMPTY STOMACH 90 capsule 2    ondansetron 4 MG tablet Take 1 tablet (4 mg) by mouth every 8 hours as needed for nausea/vomiting. 20 tablet 0    oxyCODONE 5 MG tablet Take 0.5-1 tablets (2.5-5 mg) by mouth every 6 hours as needed for severe pain. 20 tablet 0    senna 8.6 MG tablet Take 1 tablet (8.6 mg) by  mouth 2 times a day to prevent constipation while on opioids. 20 tablet 0    tretinoin 0.05 % cream APPLY TO AFFECTED AREA(S) ON FACE AT BEDTIME 20 g 1    valACYclovir 500 MG tablet Take 1 tablet (500 mg) by mouth daily. 30 tablet 1     No facility-administered medications prior to visit.        PSH:   Past Surgical History:   Procedure Laterality Date    PR UNLISTED PROCEDURE FOOT/TOES      PR UNLISTED PROCEDURE HANDS/FINGERS      PR UNLISTED PROCEDURE LEG/ANKLE      Tonsilectomy      Childhood    URETHRAL DILATION/DVIU          Family History:  family history includes Heart Attack (age of onset: 88) in her sister.    Social History:  Social History     Socioeconomic History    Marital status: Married     Spouse name: Not on file    Number of children: Not on file    Years of education: Not on file    Highest education level: Not on file   Occupational History    Not on file   Tobacco Use    Smoking status: Former    Smokeless tobacco: Never   Substance and Sexual Activity    Alcohol use: Yes     Alcohol/week: 1.0 standard drink of alcohol     Types: 1 Glasses of wine per week     Comment: occ.    Drug use: No    Sexual activity: Yes     Partners: Male   Other Topics Concern    Not on file   Social History Narrative    Not on file     Social Determinants of Health     Financial Resource Strain: Not on file   Food Insecurity: Not on file   Transportation Needs: Not on file   Physical Activity: Not on file  Stress: Not on file   Social Connections: Not on file   Intimate Partner Violence: Not on file   Housing Stability: Not on file        Physical Exam:   Vitals:    02/01/22 1333   Temp: 37.4 C   Pulse: 72   BP: 123/71   SpO2: 99%        General: The patient is 72 year old year old female White  with constitutional symptom: appears age stated.    Psychiatric/mood: normal mood and affect.    Neurological: alert and oriented x3.    Vascular:   LEFT:  Dorsalis pedis pulse is palpable.  Posterior tibial pulse is  palpable. Capillary filling time note to be immediate  on the distal hallux.      Neurologic:  LEFT:  The sensation is intact on the foot and ankle.  Motor coordination is intact with normal muscle tone.  There is no manifestations of pathological reflexes noted.      Dermatological:   LEFT:  Upon inspection, the skin note to have normal texture/tone/turgor.  There is no ulceration/open wound or dermatosis present.  Integument coloration is within normal range and temperature is within normal range.  The hair growth is normal.      Musculoskeletal:   LEFT:  There is no pain on palpation along the medial gutter of the ankle joint, medial malleolus and posterior tibialis tendon.  There is no swelling noted over the affect region. There is no warmth to touch, ecchymosis, crepitation on ROM, limited ROM, and instability on the affected area.            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  Discussion included spectrum of treatments/diagnostic test available for this condition that includes   continue current care and surgery.   After the discussion, the patient elected continue current care.        We have reviewed MRI and SPECT CT scan finding and detail discussion took place with the patient regards:  increase signal on medial shoulder of tibial plafond.         Return appointment: PRN or symptom worsen    Vira Browns DPM, FACFAS  Reconstructive Ankle and Foot Surgeon  [facility]

## 2022-02-01 NOTE — Progress Notes (Signed)
Cassie Contreras is a 72 year old year old female who presents today for  SPECT-CT scan review  of left ankle.    The patient states remained unchanged, doing good little sore.     The current condition has existed for 4 month(s).    Patient describes the symptoms as  none .     The level the pain/severity of pain is none.   The condition is worse when none.     Current treatments/recent studies include  ankle brace, Hoka bondi, cortisone injection, and orthotic.     The response to the treatment is moderate improvement.     Patient's activity limitation is none.

## 2022-02-12 NOTE — Progress Notes (Unsigned)
Cassie Contreras is a 72 year old female who is now 3 months s/p  R thumb UCL repair  DOS 11/10/2021    Patient has been working with OT on ROM exercises. At the last visit, there was good stability over the R thumb MCP joint but the joint is subluxed radially.  atient complained of increased L thumb pain as she's recovering from her R thumb surgery.  Radiographs of the L thumb from 12/26/2021 demonstrated L thumb CMC joint arthritis as well as 10 degree MCP joint hyperextension    EXAM  Incision is well-healed over the R thumb  Sensation to light touch is intact over the radial/ulnar aspect of the R thumb  No radial or ulnar laxity R thumb MCP joint      STUDIES  X-rays of the R thumb from today demonstrated ***        IMPRESSION  3 months s/p R thumb UCL repair. Doing well.    PLAN  ***      Loistine Simas, MD  Professor, Dept of Orthopaedics and [employer] [facility]  Hand, Wrist, and Elbow Surgery

## 2022-02-13 ENCOUNTER — Ambulatory Visit
Admission: RE | Admit: 2022-02-13 | Discharge: 2022-02-13 | Disposition: A | Discharge status: Home / Self Care | Payer: PPO | Source: Ambulatory Visit | Attending: Diagnostic Radiology | Admitting: Diagnostic Radiology

## 2022-02-13 ENCOUNTER — Encounter (INDEPENDENT_AMBULATORY_CARE_PROVIDER_SITE_OTHER): Payer: Self-pay | Admitting: Hand Surgery

## 2022-02-13 ENCOUNTER — Ambulatory Visit (INDEPENDENT_AMBULATORY_CARE_PROVIDER_SITE_OTHER): Payer: PPO | Admitting: Hand Surgery

## 2022-02-13 ENCOUNTER — Ambulatory Visit (HOSPITAL_BASED_OUTPATIENT_CLINIC_OR_DEPARTMENT_OTHER): Payer: PPO | Admitting: Rehabilitative and Restorative Service Providers"

## 2022-02-13 VITALS — BP 134/74 | HR 65 | Temp 98.1°F | Ht 69.0 in | Wt 150.0 lb

## 2022-02-13 DIAGNOSIS — M79641 Pain in right hand: Secondary | ICD-10-CM

## 2022-02-13 DIAGNOSIS — Z4789 Encounter for other orthopedic aftercare: Secondary | ICD-10-CM | POA: Insufficient documentation

## 2022-02-13 DIAGNOSIS — S63641D Sprain of metacarpophalangeal joint of right thumb, subsequent encounter: Secondary | ICD-10-CM

## 2022-02-13 DIAGNOSIS — M25641 Stiffness of right hand, not elsewhere classified: Secondary | ICD-10-CM

## 2022-02-13 NOTE — Progress Notes (Signed)
OCCUPATIONAL THERAPY DISCHARGE SUMMARY    Certification from 16/10/96 through 02/19/22    GENERAL VISIT INFO    Diagnosis: 72 year old female s/p right thumb MCP joint UCL repair on 11/10/21    Therapy diagnosis: pain and stiffness right thumb    Precautions: Activity as tolerated     History: Patient is a 72 year old right hand dominant female who was picking up a stack of dishes when she felt pain and instability in the right thumb. Surgery 11-10-2021 for repair of UCL thumb MPJ.  Lives in [place] with spouse. She also has support of daughter and son in law who live close by. She is retired and enjoys drawing and painting  She works out with a Clinical biochemist.    Encounter Diagnoses   Name Primary?    Rupture of ulnar collateral ligament of right thumb, subsequent encounter Yes    Right hand pain     Stiffness of finger joint of right hand      Referring Provider: Verlon Setting, PA-C   Referring Provider NPI: [id number]  Reason for Referral: Please Evaluate and Treat, including: wound care with debridements, edema control, scar management, desensitization, and AROM of wrist and fingers.  Orthosis: thumb spica orthosis   Treatment frequency as determined by the Therapist.     VISIT COUNT   7    EVALUATION     General Assessment  Certification From*: 14/78/29  Certification To*: 56/21/30  Date of Symptom Onset*: 11/10/21  Start of Care Date*: 11/21/21  Reason for Referral*: Patient is a right hand dominant female who was picking up a stack of dishes when she felt pain and instability in the right thumb.  Surgery 11-10-2021 for repaijnr of UCL thumb MPJ.  Lives in [place] with spouse .  She also has suport of daughter and son in law who lives close by.  She is retired and enjoys drawing and painting  She works out with a Clinical research associate 2 days a week  Date of Surgery: 11/10/21  Type of Surgery: Thumb MPJ UCL repair  Precautions: less 2# CCWB  Precaution From: 11/10/21  Precaution To: 12/21/21  Best Level of  Pain: 0 - No pain  Worst Level of Pain: 1  Aggravating Factors: accidnetal contact or excessive motion  Relieving Factors: rest  Patient Outcome Tool: QuickDash  Outcome Score: 61.36  Body Part/Category: thumb  Based on screening, patient's fall risk is low.    Prior Level of Function  Level of Independence: Independent with ADLs and functional transfers  Lives With: Spouse  Receives Help From: Family  Ambulation: Independent  ADL Performance: Independent  Surveyor, minerals: Independent  Vocational: Retired  Leisure: Geographical information systems officer , Haematologist    Past Medical History:   Diagnosis Date    Abdominal pain     Anxiety     Arthritis     Asthma     Constipation     Depression     GERD (gastroesophageal reflux disease)     Headache     Heart palpitations     History of irritable bowel syndrome     Hormone disorder     Metatarsal stress fracture of right foot     TX FROM PAPER CHART      Primary insomnia     Squamous cell carcinoma in situ of skin     Thyroid disease     UTI (urinary tract infection)        Past Surgical  History:   Procedure Laterality Date    PR UNLISTED PROCEDURE FOOT/TOES      PR UNLISTED PROCEDURE HANDS/FINGERS      PR UNLISTED PROCEDURE LEG/ANKLE      Tonsilectomy      Childhood    URETHRAL DILATION/DVIU         Social History     Social History Narrative    Not on file         ASSESSMENT    Previous Interventions: Custom orthosis fabrication, orthotic management, edema management, scar management, therapeutic exercises, manual therapy    Barriers to learning: none    Preferred learning style: demonstration, written, and verbal    Rehabilitation potential is: Excellent    Assessment: Patient is a 72 year old female who is s/p right thumb MCP joint UCL repair on 11/10/21, and is presenting with improved ROM, stability and functional use of her right hand. Patient will transition to independent home program at this time, but may return as needs arise.     TREATMENT AND PLAN    Pain: The  patient is reporting 1/10 level of pain, on a 0-10 Numeric Pain Distress Scale.    Subjective: Patient reports she is doing well overall, but her thumb and grip are weak, and she has difficulty with handwriting.     Skilled services/Interventions provided: Patient seen today following appointment with Dr. Carmina Miller in [facility]. Assessed current status. Instructed per grip and pinch strengthening exercises with yellow and red therapy putty, and vended handout. Instructed per gradual return to activities as tolerated, using orthosis as needed for protection during fall risk activities. Patient verbalized understanding of all instructions today, and agreed with plan.     Range of Motion:   - Right tuhmb MCP 0-70, IP 0-65, opposition to small finger MCP joint    Strength: not tested due to precautions      Reason for discharge: Patient has met therapeutic goals.     Long-term functional goals: Patient able to grasp the pencil, paint brush and writing tool in her right hand for art work by 02/19/22.    Status: Goals met    Patient Education: verbal instructions and demonstrations were provided for the above exercises. The patient was able to demonstrate the exercises after instructions.    Interventions/Treatment provided:  Total Treatment Minutes: 15 min   Total Timed Code Minutes: 15 min     Therapeutic Procedures  PR THERAPEUTIC PX 1/> AREAS EACH 15 MIN EXERCISES - total minutes: 15      Cassie Contreras, MOT, OTR/L CHT

## 2022-02-19 ENCOUNTER — Other Ambulatory Visit (HOSPITAL_BASED_OUTPATIENT_CLINIC_OR_DEPARTMENT_OTHER): Payer: Self-pay | Admitting: Urology

## 2022-02-19 DIAGNOSIS — N390 Urinary tract infection, site not specified: Secondary | ICD-10-CM

## 2022-02-21 NOTE — Telephone Encounter (Signed)
Patient last seen on 2.22.23 for followup and was to return in 6 weeks.  Pharmacy requesting for future, not due now, so NO refill authorized at this time.    Please schedule follow up visit.

## 2022-02-22 NOTE — Telephone Encounter (Signed)
Estradiol pended until March, if appropriate.

## 2022-02-22 NOTE — Telephone Encounter (Signed)
Spoke with patient the soonest I could get her for telemed was 3/15. Patient worried since her follow up appt is in March, running low on medication would need a refill before her appt. Please help and assist.

## 2022-02-22 NOTE — Telephone Encounter (Signed)
LVM for patient to return call and schedule f/u     Per pharmacy unable to approve refills on medications until follow up complete

## 2022-02-22 NOTE — Addendum Note (Signed)
Addended byCarin Primrose D on: 02/22/2022 01:32 PM     Modules accepted: Orders

## 2022-02-23 MED ORDER — ESTRADIOL 10 MCG VA TABS
10.0000 ug | ORAL_TABLET | VAGINAL | 0 refills | Status: DC
Start: 2022-02-23 — End: 2022-03-17

## 2022-03-01 ENCOUNTER — Other Ambulatory Visit (HOSPITAL_BASED_OUTPATIENT_CLINIC_OR_DEPARTMENT_OTHER): Payer: Self-pay | Admitting: Family Medicine

## 2022-03-01 ENCOUNTER — Encounter (INDEPENDENT_AMBULATORY_CARE_PROVIDER_SITE_OTHER): Payer: Self-pay | Admitting: Family Medicine

## 2022-03-06 ENCOUNTER — Telehealth (INDEPENDENT_AMBULATORY_CARE_PROVIDER_SITE_OTHER): Payer: Self-pay | Admitting: Family Medicine

## 2022-03-06 NOTE — Telephone Encounter (Signed)
Scheduled
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
## 2022-03-08 ENCOUNTER — Encounter (INDEPENDENT_AMBULATORY_CARE_PROVIDER_SITE_OTHER): Payer: Self-pay | Admitting: Family Medicine

## 2022-03-08 ENCOUNTER — Ambulatory Visit (INDEPENDENT_AMBULATORY_CARE_PROVIDER_SITE_OTHER): Payer: Auto Insurance (includes no fault) | Admitting: Family Medicine

## 2022-03-08 VITALS — BP 158/87 | HR 74 | Temp 98.0°F | Resp 16 | Wt 150.0 lb

## 2022-03-08 DIAGNOSIS — S335XXA Sprain of ligaments of lumbar spine, initial encounter: Secondary | ICD-10-CM
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
## 2022-03-09 ENCOUNTER — Other Ambulatory Visit (HOSPITAL_BASED_OUTPATIENT_CLINIC_OR_DEPARTMENT_OTHER): Payer: Self-pay | Admitting: Urology

## 2022-03-09 DIAGNOSIS — N390 Urinary tract infection, site not specified: Secondary | ICD-10-CM

## 2022-03-10 NOTE — Telephone Encounter (Signed)
duplicate

## 2022-03-14 ENCOUNTER — Other Ambulatory Visit (INDEPENDENT_AMBULATORY_CARE_PROVIDER_SITE_OTHER): Payer: Self-pay | Admitting: Family Medicine

## 2022-03-14 ENCOUNTER — Other Ambulatory Visit (INDEPENDENT_AMBULATORY_CARE_PROVIDER_SITE_OTHER): Payer: PPO

## 2022-03-14 DIAGNOSIS — A09 Infectious gastroenteritis and colitis, unspecified: Secondary | ICD-10-CM

## 2022-03-14 LAB — ENTERIC PATHOGENS PANEL

## 2022-03-14 NOTE — Addendum Note (Signed)
Addended by: Melrose Nakayama on: 03/14/2022 10:00 AM     Modules accepted: Orders

## 2022-03-14 NOTE — Progress Notes (Signed)
S/w ID fellow, given husband's community acquired c diff in August she recommended inclusion.

## 2022-03-17 ENCOUNTER — Encounter (INDEPENDENT_AMBULATORY_CARE_PROVIDER_SITE_OTHER): Payer: Self-pay | Admitting: Family Medicine

## 2022-03-17 ENCOUNTER — Encounter (HOSPITAL_BASED_OUTPATIENT_CLINIC_OR_DEPARTMENT_OTHER): Payer: Self-pay | Admitting: Urology

## 2022-03-17 DIAGNOSIS — N390 Urinary tract infection, site not specified: Secondary | ICD-10-CM

## 2022-03-17 DIAGNOSIS — S335XXA Sprain of ligaments of lumbar spine, initial encounter: Secondary | ICD-10-CM

## 2022-03-17 NOTE — Telephone Encounter (Signed)
Last OV: 03/16/21  Next OV: 03/28/22    Pended refill for you to review and sign for Estradiol tablets.  Leia Alf CMA

## 2022-03-21 MED ORDER — ESTRADIOL 10 MCG VA TABS
10.0000 ug | ORAL_TABLET | VAGINAL | 0 refills | Status: DC
Start: 2022-03-23 — End: 2022-05-29
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
## 2022-03-22 NOTE — Telephone Encounter (Signed)
Referral processed  ecare message sent to patient to inform and gave scheduling info

## 2022-03-22 NOTE — Telephone Encounter (Signed)
signed

## 2022-03-23 NOTE — Result Encounter Note (Signed)
I have reviewed the results and they are unremarkable.

## 2022-03-25 ENCOUNTER — Other Ambulatory Visit (INDEPENDENT_AMBULATORY_CARE_PROVIDER_SITE_OTHER): Payer: Self-pay | Admitting: Neurology

## 2022-03-25 DIAGNOSIS — R519 Headache, unspecified: Secondary | ICD-10-CM

## 2022-03-27 ENCOUNTER — Telehealth (HOSPITAL_BASED_OUTPATIENT_CLINIC_OR_DEPARTMENT_OTHER): Payer: Self-pay | Admitting: Neurology

## 2022-03-27 ENCOUNTER — Encounter (HOSPITAL_BASED_OUTPATIENT_CLINIC_OR_DEPARTMENT_OTHER): Payer: Self-pay | Admitting: Neurology

## 2022-03-27 DIAGNOSIS — R519 Headache, unspecified: Secondary | ICD-10-CM

## 2022-03-27 MED ORDER — MEMANTINE HCL 10 MG OR TABS
10.0000 mg | ORAL_TABLET | Freq: Every day | ORAL | 0 refills | Status: DC
Start: 2022-03-27 — End: 2022-03-27

## 2022-03-27 NOTE — Telephone Encounter (Signed)
RETURN CALL: Voicemail - General Message      SUBJECT:  Appointment Request     REASON FOR VISIT: follow up/ in person    PREFERRED DATE/TIME: soonest    REASON UNABLE TO APPOINT: see previous TE for refill patient needs to schedule follow up. Patient advises she was told she needs to be scheduled at [facility] side. Unable to reach front desk. Please follow up.

## 2022-03-27 NOTE — Telephone Encounter (Signed)
Replied to patient reminding her of the annual clinic policy and asked the front desk staff to help offer a follow up appointment.    The provider to be notified.

## 2022-03-27 NOTE — Telephone Encounter (Signed)
This medication is outside of the Refill Center's protocols. Please sign and close the encounter if you approve: memantine    Last visit 05/04/20.    If this medication is denied please have your staff inform the patient and schedule an appointment if necessary.

## 2022-03-28 ENCOUNTER — Ambulatory Visit (HOSPITAL_COMMUNITY): Payer: PPO

## 2022-03-28 ENCOUNTER — Encounter (INDEPENDENT_AMBULATORY_CARE_PROVIDER_SITE_OTHER): Payer: Self-pay | Admitting: Family Medicine

## 2022-03-28 ENCOUNTER — Ambulatory Visit: Payer: PPO | Admitting: Urology

## 2022-03-28 ENCOUNTER — Encounter (HOSPITAL_BASED_OUTPATIENT_CLINIC_OR_DEPARTMENT_OTHER): Payer: Self-pay | Admitting: Urology

## 2022-03-28 ENCOUNTER — Encounter (HOSPITAL_BASED_OUTPATIENT_CLINIC_OR_DEPARTMENT_OTHER): Payer: Self-pay

## 2022-03-28 DIAGNOSIS — N952 Postmenopausal atrophic vaginitis: Secondary | ICD-10-CM

## 2022-03-28 DIAGNOSIS — N390 Urinary tract infection, site not specified: Secondary | ICD-10-CM

## 2022-03-28 MED ORDER — MEMANTINE HCL 10 MG OR TABS
10.0000 mg | ORAL_TABLET | Freq: Every day | ORAL | 0 refills | Status: DC
Start: 2022-03-28 — End: 2022-03-29

## 2022-03-28 NOTE — Telephone Encounter (Signed)
Replied to patient letting her know that  the Memantine Rx was sent as below.    Approved Prescriptions     memantine 10 MG tablet         Sig: Take 1 tablet (10 mg) by mouth daily.    Disp: 30 tablet    Refills: 0    Start: 03/28/2022    Class: Normal    Authorized by: Fransico Setters, MD    For: New onset of headaches after age 72        To be filled at: [facility] [place] [phone]

## 2022-03-28 NOTE — Telephone Encounter (Signed)
Called patient and offered to schedule a follow-up appointment at the headache clinic with an APP. Patient said they have not problem scheduling with APP, however, they requested to have their care moved to the [facility] clinic as it is closest to their home and that's where they prefer to schedule from.     Sending message [facility], please contact patient to schedule their next follow-up ( TOC ).    Headache team, FYI patients wants to switch care to [facility].    Thank you,  Altheria Shadoan

## 2022-03-28 NOTE — Progress Notes (Signed)
This visit is being conducted over the telephone at the patient's request: Yes  Patient gives verbal consent to proceed and knows there may be a copay/deductible: Yes    Time spent with patient/guardian on this telephone visit: 10 minutes    I am providing medical care to this patient via a telephone visit in place of an in person visit at the request of the patient.     72 yo woman with h/o mixed urinary incontinence, lifelong utis and ? neuromuscular obstructive outlet dysfunction on vaginal estrogen/vagifem with intermittent PFPT who is requesting refill of her vaginal estrogen. She was last seen 02/2021.    Patient states she is doing well. Has had no utis. No worsening of urinary symptoms. No change in her health. Wants to continue on vaginal estrogen and simply needs RX refilled.      I/P: H/o frequent uti and retentive voiding dysfunction. Doing well.         Refill vagifem for 12 months. F/u yearly

## 2022-03-29 ENCOUNTER — Ambulatory Visit: Payer: PPO | Admitting: Neurology

## 2022-03-29 VITALS — BP 137/79 | HR 68 | Resp 14 | Ht 69.0 in | Wt 153.0 lb

## 2022-03-29 DIAGNOSIS — G43109 Migraine with aura, not intractable, without status migrainosus: Secondary | ICD-10-CM

## 2022-03-29 DIAGNOSIS — M5440 Lumbago with sciatica, unspecified side: Secondary | ICD-10-CM

## 2022-03-29 DIAGNOSIS — R519 Headache, unspecified: Secondary | ICD-10-CM

## 2022-03-29 DIAGNOSIS — M545 Low back pain, unspecified: Secondary | ICD-10-CM | POA: Insufficient documentation

## 2022-03-29 MED ORDER — MEMANTINE HCL 10 MG OR TABS
10.0000 mg | ORAL_TABLET | Freq: Every day | ORAL | 11 refills | Status: DC
Start: 2022-03-29 — End: 2022-10-11
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
## 2022-03-29 NOTE — Patient Instructions (Addendum)
Dear Velva Harman     Happy 2024    It was a great pleasure seeing you    Plan is to continue memantine    Plan to add further exercises with    Megaformer Pilates- Trainer    TRX trainer    Warmest regards,  Fransico Setters MD, Director Headache Clinic

## 2022-03-29 NOTE — Telephone Encounter (Signed)
Routing to LCS PN.

## 2022-03-30 ENCOUNTER — Encounter (INDEPENDENT_AMBULATORY_CARE_PROVIDER_SITE_OTHER): Payer: Self-pay | Admitting: Neurology

## 2022-03-30 ENCOUNTER — Other Ambulatory Visit (INDEPENDENT_AMBULATORY_CARE_PROVIDER_SITE_OTHER): Payer: Self-pay | Admitting: Family Medicine

## 2022-03-30 DIAGNOSIS — S335XXA Sprain of ligaments of lumbar spine, initial encounter: Secondary | ICD-10-CM

## 2022-04-03 MED ORDER — CYCLOBENZAPRINE HCL 5 MG OR TABS
5.0000 mg | ORAL_TABLET | Freq: Three times a day (TID) | ORAL | 2 refills | Status: DC | PRN
Start: 2022-04-03 — End: 2022-08-23

## 2022-04-03 NOTE — Progress Notes (Signed)
Orders Placed This Encounter    cyclobenzaprine 5 MG tablet     Sig: Take 1 tablet (5 mg) by mouth 3 times a day as needed for muscle spasms (5 to 10 mg at onset of muscle spasm as needed).     Dispense:  90 tablet     Refill:  2   Warmest regards,  Fransico Setters MD, Director Headache Clinic

## 2022-04-03 NOTE — Telephone Encounter (Signed)
Patient's chart was reviewed  for possible Lung Cancer Screening outreach -        Patient Details:  - Age:age 72  - Smoking Status : Former smoker - quit date N/A  - Pack-Years of tobacco use: N/A  - Years since quit:N/A      Outcome: Sent mc

## 2022-04-05 NOTE — Telephone Encounter (Signed)
Called to schedule patient for a telemed she stated she spoke with Sabra Heck already that she didn't need to schedule for an appt.

## 2022-04-10 ENCOUNTER — Encounter (INDEPENDENT_AMBULATORY_CARE_PROVIDER_SITE_OTHER): Payer: Self-pay | Admitting: Family Medicine

## 2022-04-10 NOTE — Telephone Encounter (Signed)
Pt not high risk, smoking hx updated  Closing TE

## 2022-04-12 ENCOUNTER — Ambulatory Visit
Admission: RE | Admit: 2022-04-12 | Discharge: 2022-04-12 | Disposition: A | Discharge status: Home / Self Care | Payer: PPO | Attending: Diagnostic Radiology | Admitting: Diagnostic Radiology

## 2022-04-12 ENCOUNTER — Encounter (HOSPITAL_BASED_OUTPATIENT_CLINIC_OR_DEPARTMENT_OTHER): Payer: Self-pay

## 2022-04-12 DIAGNOSIS — Z1231 Encounter for screening mammogram for malignant neoplasm of breast: Secondary | ICD-10-CM | POA: Insufficient documentation

## 2022-04-13 ENCOUNTER — Other Ambulatory Visit (INDEPENDENT_AMBULATORY_CARE_PROVIDER_SITE_OTHER): Payer: Self-pay | Admitting: Family Medicine

## 2022-04-13 DIAGNOSIS — K219 Gastro-esophageal reflux disease without esophagitis: Secondary | ICD-10-CM

## 2022-04-14 MED ORDER — OMEPRAZOLE 20 MG OR CPDR
DELAYED_RELEASE_CAPSULE | ORAL | 0 refills | Status: DC
Start: 2022-04-14 — End: 2022-06-05

## 2022-04-26 ENCOUNTER — Other Ambulatory Visit (INDEPENDENT_AMBULATORY_CARE_PROVIDER_SITE_OTHER): Payer: Self-pay | Admitting: Family Medicine

## 2022-04-26 DIAGNOSIS — K219 Gastro-esophageal reflux disease without esophagitis: Secondary | ICD-10-CM

## 2022-05-04 ENCOUNTER — Encounter (HOSPITAL_BASED_OUTPATIENT_CLINIC_OR_DEPARTMENT_OTHER): Payer: Self-pay | Admitting: Urology

## 2022-05-04 DIAGNOSIS — R399 Unspecified symptoms and signs involving the genitourinary system: Secondary | ICD-10-CM

## 2022-05-05 ENCOUNTER — Encounter (INDEPENDENT_AMBULATORY_CARE_PROVIDER_SITE_OTHER): Payer: Self-pay | Admitting: Family Medicine

## 2022-05-05 ENCOUNTER — Other Ambulatory Visit (HOSPITAL_BASED_OUTPATIENT_CLINIC_OR_DEPARTMENT_OTHER): Payer: Self-pay | Admitting: Urology

## 2022-05-05 ENCOUNTER — Other Ambulatory Visit
Admission: RE | Admit: 2022-05-05 | Discharge: 2022-05-05 | Disposition: A | Discharge status: Home / Self Care | Payer: PPO | Source: Ambulatory Visit | Attending: Family Medicine | Admitting: Family Medicine

## 2022-05-05 DIAGNOSIS — R399 Unspecified symptoms and signs involving the genitourinary system: Secondary | ICD-10-CM

## 2022-05-05 DIAGNOSIS — N390 Urinary tract infection, site not specified: Secondary | ICD-10-CM

## 2022-05-05 LAB — URINALYSIS WITH REFLEX CULTURE
Bilirubin (Qual), URN: NEGATIVE
Epith Cells_Renal/Trans,URN: NEGATIVE /HPF
Glucose Qual, URN: NEGATIVE
Ketones, URN: NEGATIVE
Leukocyte Esterase, URN: POSITIVE — AB
Nitrite, URN: NEGATIVE
Protein (Alb Semiquant), URN: NEGATIVE
Specific Gravity, URN: 1.011 g/mL (ref 1.006–1.027)
pH, URN: 6.5 (ref 5.0–8.0)

## 2022-05-05 LAB — REFLEX CULTURE FOR UA

## 2022-05-05 MED ORDER — NITROFURANTOIN MONOHYD MACRO 100 MG OR CAPS
100.0000 mg | ORAL_CAPSULE | Freq: Two times a day (BID) | ORAL | 0 refills | Status: AC
Start: 2022-05-05 — End: 2022-05-12

## 2022-05-08 ENCOUNTER — Encounter (INDEPENDENT_AMBULATORY_CARE_PROVIDER_SITE_OTHER): Payer: Self-pay | Admitting: Family Medicine

## 2022-05-08 LAB — URINE C/S
Culture: 11000 — AB
Culture: >100000 — AB

## 2022-05-08 NOTE — Progress Notes (Signed)
Notified patient of finding and abx rxd

## 2022-05-08 NOTE — Telephone Encounter (Signed)
Routing to provider to review and advise

## 2022-05-15 ENCOUNTER — Other Ambulatory Visit (INDEPENDENT_AMBULATORY_CARE_PROVIDER_SITE_OTHER): Payer: Self-pay | Admitting: Family Medicine

## 2022-05-15 DIAGNOSIS — N390 Urinary tract infection, site not specified: Secondary | ICD-10-CM

## 2022-05-15 MED ORDER — PHENAZOPYRIDINE HCL 100 MG OR TABS
100.0000 mg | ORAL_TABLET | Freq: Three times a day (TID) | ORAL | 0 refills | Status: AC
Start: 2022-05-15 — End: 2022-05-17

## 2022-05-15 MED ORDER — CIPROFLOXACIN HCL 500 MG OR TABS
500.0000 mg | ORAL_TABLET | Freq: Two times a day (BID) | ORAL | 0 refills | Status: DC
Start: 2022-05-15 — End: 2022-08-09

## 2022-05-16 ENCOUNTER — Encounter (INDEPENDENT_AMBULATORY_CARE_PROVIDER_SITE_OTHER): Payer: Self-pay | Admitting: Family Medicine

## 2022-05-16 NOTE — Telephone Encounter (Signed)
Routing to provider for review and advisement.

## 2022-05-24 ENCOUNTER — Ambulatory Visit
Admission: RE | Admit: 2022-05-24 | Discharge: 2022-05-24 | Disposition: A | Discharge status: Home / Self Care | Payer: PPO | Attending: Diagnostic Radiology | Admitting: Diagnostic Radiology

## 2022-05-24 ENCOUNTER — Ambulatory Visit (HOSPITAL_BASED_OUTPATIENT_CLINIC_OR_DEPARTMENT_OTHER): Payer: PPO

## 2022-05-24 DIAGNOSIS — E041 Nontoxic single thyroid nodule: Secondary | ICD-10-CM | POA: Insufficient documentation

## 2022-05-26 ENCOUNTER — Telehealth (INDEPENDENT_AMBULATORY_CARE_PROVIDER_SITE_OTHER): Payer: Self-pay | Admitting: Family Medicine

## 2022-05-26 NOTE — Telephone Encounter (Signed)
Please contact patient to see if she can see me by #TH on Monday 5/6 at 9 or 11. Thanks.

## 2022-05-26 NOTE — Telephone Encounter (Signed)
Patient is scheduled on 05/30/22 with Dr. Randol Kern. Did we want to r/s that appt?

## 2022-05-26 NOTE — Telephone Encounter (Signed)
Spoke to patient and scheduled telemed visit on 05/29/22 at 9. Forwarding as FYI and closing TE

## 2022-05-26 NOTE — Telephone Encounter (Signed)
Yes

## 2022-05-29 ENCOUNTER — Telehealth (INDEPENDENT_AMBULATORY_CARE_PROVIDER_SITE_OTHER): Payer: PPO | Admitting: Family Medicine

## 2022-05-29 DIAGNOSIS — Z79890 Hormone replacement therapy (postmenopausal): Secondary | ICD-10-CM

## 2022-05-29 DIAGNOSIS — N390 Urinary tract infection, site not specified: Secondary | ICD-10-CM

## 2022-05-29 DIAGNOSIS — M87076 Idiopathic aseptic necrosis of unspecified foot: Secondary | ICD-10-CM

## 2022-05-29 MED ORDER — ESTRADIOL 10 MCG VA TABS
10.0000 ug | ORAL_TABLET | VAGINAL | 3 refills | Status: DC
Start: 2022-05-29 — End: 2022-08-23

## 2022-05-29 MED ORDER — ESTRADIOL-PROGESTERONE 0.5-100 MG OR CAPS
1.0000 | ORAL_CAPSULE | Freq: Every day | ORAL | 3 refills | Status: DC
Start: 2022-05-29 — End: 2022-08-09

## 2022-05-29 NOTE — Progress Notes (Signed)
ASSESSMENT/PLAN:  (Z79.890) Post-menopause on HRT (hormone replacement therapy)  (primary encounter diagnosis)  Plan: estradiol-progesterone 0.5-100 MG capsule        Pathophysiology, work-up and treatment rationale explained in detail. Side effects reviewed. Notify me re changes in headaches w this restart    (M87.076) Avascular necrosis of bone of foot (HCC)  Plan: careful when working out, thinking about orthotics    (N39.0) Recurrent UTI  Plan: estradiol 10 MCG vaginal tablet        refilled       Distant Site Telemedicine Encounter  I conducted this encounter via secure, live, face-to-face video conference with the patient. I reviewed the risks and benefits of telemedicine as pertinent to this visit and the patient agreed to proceed.    Provider Location: On-site location (clinic, hospital, on-site office)  Patient Location: At home  Present with patient: No one else present       END OF VISIT SUMMARY  PLEASE SEE BELOW FOR SUPPORTING DOCUMENTATION AND DETAILS.   -----------------------------------------------------------------------------------------------------------------    SUBJECTIVE:  Cassie Contreras is presenting today for the following issue(s):    Here to discuss HRT for menopausal symptoms of hot flashes mostly in evenings espeially in light of recent data re safety of its use.  Dr. Hyacinth Meeker has been rx estrogen intravaginal tabs.  Used to Prempro for "a long time" stopped in 2014.  Migraines are "excellent" on memantine.      Patient Active Problem List   Diagnosis    Os peroneum syndrome of left foot    S/P foot & ankle surgery, left    Neuritis of left sural nerve    Recurrent UTI    Osteopenia of spine    Atrophic vaginitis    Hormone replacement therapy    Thyroid nodule    Mixed incontinence    Pelvic floor weakness    Obstructive sleep apnea    Benign hypertension    Anxiety    Breast lump on right side at 11 o'clock position    Migraine with aura and without status migrainosus, not intractable     New onset of headaches after age 47    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  No acute distress.  Speaking in full sentences.     -----------------------------------------------------------------------------------------------------------------  ALSO SEE THE BEGINNING OF THIS NOTE FOR ASSESSMENT AND PLAN    -----------------------------------------------------------------------------------------------------------------

## 2022-05-30 ENCOUNTER — Telehealth (INDEPENDENT_AMBULATORY_CARE_PROVIDER_SITE_OTHER): Payer: PPO | Admitting: Family Medicine

## 2022-05-30 DIAGNOSIS — Z0000 Encounter for general adult medical examination without abnormal findings: Secondary | ICD-10-CM

## 2022-05-30 DIAGNOSIS — E041 Nontoxic single thyroid nodule: Secondary | ICD-10-CM

## 2022-05-30 NOTE — Progress Notes (Signed)
ANNUAL WELLNESS VISIT        Kelbi Cutlip is a 72 year old female who presents for a subsequent Annual Wellness Visit.    Is this a Telemedicine or a Phone visit?  Telemedicine (live, interactive video & audio)   Distant Site Telemedicine Encounter  I conducted this encounter via secure, live, face-to-face video conference with the patient. I reviewed the risks and benefits of telemedicine as pertinent to this visit and the patient agreed to proceed.    Provider Location: On-site location (clinic, hospital, on-site office)  Patient Location: At home  Present with patient: No one else present       She is feeling well overall.  Agreeable to labs.  She would like me to check her thyroid as well.    She has a history of thyroid nodules.  She is able to appreciate the nodule - sometimes feels like it effects swallowing.  She underwent FNA 07/01/2021 w/ Dr. Jacqualine Mau.  Pathology:  BENIGN, consistent with benign follicular nodule (including adenomatoid nodule and colloid nodule)  She recently had a repeat u/s of the thyroid on 05/25/22.  There is this interesting note:   "Nodule 1: 4.1 x 3.8 x 2.0 cm, prev. 3.6 x 3.5 x 2.1 cm (2 nodules were mentioned on the prior exam however these appear to be single nodule located in the isthmus, no definite nodule is seen in the left thyroid lobe.)"  IMPRESSION  =========  No significant change in size of the nodule seen in the isthmus compared to the prior exams. TR3  No new nodules are seen.  ACR-TIRADS category 3: 3 points. Mildly suspicious. FNA if greater than/equal to 2.5 cm. Follow if greater than/equal to 1.5 cm. Follow up suggested in 1, 3 and 5 years.    She says she recently got the new COVID vaccine at [facility] on [address].       INFORMATION GATHERING:      The following areas were confirmed with patient/caregiver and/or updated in Epic at this visit:   Past Medical, Surgical, Family, and Social History  Current medications and supplements  Allergies  All of the above  components have been reviewed and updated: yes    Opioid Use:   Patient not taking opioids       I have reviewed the patient's risk factors for other substance use disorders.  If appropriate, I've referred the patient for treatment.      Please see today's HRA for review of patient's functional ability and level of safety. Concerns are addressed below in the Personalized Prevention Plan section.    For list of current providers and suppliers, see Care Team Section, EHR encounters, and/or HRA questionnaire for [facility] providers involved in care    Other providers and suppliers outside [place] [facility]:   No outside providers identified      Depression screening:    PHQ-2: 0  PHQ-9 (if done):       EXAM:    There were no vitals taken for this visit.        GAIT:   Unable to assess due to nature of visit    COGNITION:       Intact              ASSESSMENT:       Tocara Musser was seen for her Annual Wellness Visit, including identification of risk factors & conditions that may affect her health and function in the future.  Encounter for Medicare annual wellness exam  -     Dexa, Axial Skeleton; Future  -     Hemoglobin A1C, HPLC; Future  -     Lipid Panel; Future  -     Comprehensive Metabolic Panel; Future    Thyroid nodule  -     Thyroid Stimulating Hormone; Future  - I'm going to reach out to Dr. Jacqualine Mau as to whether this nodule needs to be FNA again.    Recommend tetanus booster at local pharmacy    Actions at this visit:      Established or updated a written schedule of screening and prevention measures recommended and appropriate for [name] for the next 5-10 years  Established or updated a list of her risk factors and conditions for which lifestyle or medical interventions are recommended or underway, including mental health risks and conditions, and including risks/benefits of treatment  Furnished personalized health advice and, as appropriate, referrals to health education or preventive  counseling services or programs (such as fall prevention, tobacco cessation, physical activity, nutrition, cognition, weight loss)    All of the above components have been reviewed and updated. Yes    Simonne Maffucci, MD

## 2022-05-30 NOTE — Patient Instructions (Addendum)
Personalized Prevention Plan:        Based on today's evaluation, I recommend the following ways to improve your health or functioning.    You have the following risk factors and/or medical conditions for which there are recommended ways (included in this list) to help you stay as healthy as possible:    Please get your tetanus vaccine at your local pharmacy.    Blood work has been ordered.    You will be called to schedule the bone density test (at [facility]).    I will get back to you about the thyroid recommendation.    Here are screening & prevention measures recommended for you:  Health Maintenance   Topic Date Due    Zoster Vaccine (2 of 3) 10/04/2019    COVID-19 Vaccine (6 - 2023-24 season) 09/23/2021    DTaP, Tdap and Td Vaccines (2 - Td or Tdap) 03/20/2022    Medicare Annual Wellness Visit  06/09/2022    Depression Screening (PHQ-2)  05/28/2023    Breast Cancer Screening  04/11/2024    Colorectal Cancer Screening  02/23/2025    Lipid Disorders Screening  06/22/2026    Osteoporosis Screening  Completed    Pneumococcal Vaccine: 65+ years  Completed    Influenza Vaccine  Completed    Hepatitis C Screening  Completed    Hepatitis B Vaccine  Completed    Hepatitis A Vaccine  Aged Out    HPV Vaccine  Aged Out       Please plan to have a Subsequent Annual Wellness Visit in 1 year.

## 2022-05-31 ENCOUNTER — Ambulatory Visit
Admission: RE | Admit: 2022-05-31 | Discharge: 2022-05-31 | Disposition: A | Discharge status: Home / Self Care | Payer: PPO | Attending: Nuclear Medicine | Admitting: Nuclear Medicine

## 2022-05-31 ENCOUNTER — Encounter (INDEPENDENT_AMBULATORY_CARE_PROVIDER_SITE_OTHER): Payer: Self-pay | Admitting: Family Medicine

## 2022-05-31 DIAGNOSIS — Z0000 Encounter for general adult medical examination without abnormal findings: Secondary | ICD-10-CM

## 2022-05-31 DIAGNOSIS — M818 Other osteoporosis without current pathological fracture: Secondary | ICD-10-CM | POA: Insufficient documentation

## 2022-06-02 ENCOUNTER — Other Ambulatory Visit (INDEPENDENT_AMBULATORY_CARE_PROVIDER_SITE_OTHER): Payer: Self-pay | Admitting: Family Medicine

## 2022-06-02 DIAGNOSIS — Z0000 Encounter for general adult medical examination without abnormal findings: Secondary | ICD-10-CM

## 2022-06-02 DIAGNOSIS — E041 Nontoxic single thyroid nodule: Secondary | ICD-10-CM

## 2022-06-02 LAB — COMPREHENSIVE METABOLIC PANEL
ALT (GPT): 23 U/L (ref 7–33)
AST (GOT): 25 U/L (ref 9–38)
Albumin: 4.1 g/dL (ref 3.5–5.2)
Alkaline Phosphatase (Total): 97 U/L (ref 38–172)
Anion Gap: 6 (ref 4–12)
Bilirubin (Total): 0.5 mg/dL (ref 0.2–1.3)
Calcium: 9.6 mg/dL (ref 8.9–10.2)
Carbon Dioxide, Total: 27 meq/L (ref 22–32)
Chloride: 98 meq/L (ref 98–108)
Creatinine: 0.71 mg/dL (ref 0.38–1.02)
Glucose: 89 mg/dL (ref 62–125)
Potassium: 5.5 meq/L — ABNORMAL HIGH (ref 3.6–5.2)
Protein (Total): 6.7 g/dL (ref 6.0–8.2)
Sodium: 131 meq/L — ABNORMAL LOW (ref 135–145)
Urea Nitrogen: 18 mg/dL (ref 8–21)
eGFR by CKD-EPI 2021: >60 mL/min/{1.73_m2} (ref >59)

## 2022-06-02 LAB — LIPID PANEL
Cholesterol/HDL Ratio: 2.1
HDL Cholesterol: 90 mg/dL (ref >39)
LDL Cholesterol, NIH Equation: 91 mg/dL (ref <130)
Non-HDL Cholesterol: 103 mg/dL (ref 0–159)
Total Cholesterol: 193 mg/dL (ref <200)
Triglyceride: 65 mg/dL (ref <150)

## 2022-06-02 LAB — THYROID STIMULATING HORMONE: Thyroid Stimulating Hormone: 1.397 u[IU]/mL (ref 0.400–5.000)

## 2022-06-02 NOTE — Result Encounter Note (Signed)
FYI

## 2022-06-02 NOTE — Progress Notes (Signed)
06/02/22     Received staff message from Dr. Jacqualine Mau:  "This looks ok -- nodule is essentially stable and since it was benign, we probably don't need to continue ultrasound follow up. It would be reasonable to get another ultrasound if she is bothered by the nodule, or there is concern for ongoing growth."    I'll send a message to [place].  Simonne Maffucci, MD

## 2022-06-03 ENCOUNTER — Telehealth (INDEPENDENT_AMBULATORY_CARE_PROVIDER_SITE_OTHER): Payer: Self-pay | Admitting: Family Medicine

## 2022-06-03 DIAGNOSIS — M810 Age-related osteoporosis without current pathological fracture: Secondary | ICD-10-CM

## 2022-06-03 LAB — HEMOGLOBIN A1C, HPLC: Hemoglobin A1C: 5.2 % (ref 4.0–6.0)

## 2022-06-03 MED ORDER — ALENDRONATE SODIUM 70 MG OR TABS
70.0000 mg | ORAL_TABLET | ORAL | 3 refills | Status: DC
Start: 2022-06-03 — End: 2022-08-23

## 2022-06-03 NOTE — Telephone Encounter (Signed)
S/w patient. Pathophysiology, work-up and treatment rationale explained in detail. Meds as rx'd. Side effects reviewed.

## 2022-06-03 NOTE — Telephone Encounter (Signed)
Dr. Jacqualine Mau, patient would like you to take a look at the recent thyroid u/s read by radiology and let her know if you'd like to see her.  We also reviewed the possibility of alcohol ablation because she thinks her occ dysphagia may be caused by her thyroid cyst, which I said is unlikely.  Thank you.

## 2022-06-05 ENCOUNTER — Telehealth (INDEPENDENT_AMBULATORY_CARE_PROVIDER_SITE_OTHER): Payer: Self-pay | Admitting: Family Medicine

## 2022-06-05 DIAGNOSIS — R1310 Dysphagia, unspecified: Secondary | ICD-10-CM

## 2022-06-05 DIAGNOSIS — K219 Gastro-esophageal reflux disease without esophagitis: Secondary | ICD-10-CM

## 2022-06-05 MED ORDER — OMEPRAZOLE 20 MG OR CPDR
20.0000 mg | DELAYED_RELEASE_CAPSULE | Freq: Every day | ORAL | 3 refills | Status: DC
Start: 2022-06-05 — End: 2023-07-29

## 2022-06-05 NOTE — Telephone Encounter (Signed)
S/w patient, has sense that pills don't go down.  Foods occasionally stuck despite omeprazole daily.  See order detail.      04/13/2017:  FINDINGS:  Mild esophageal dysmotility, with small amount of escape during the primary wave.   No hiatal hernia. No gastroesophageal reflux on this exam.   No esophagitis or other anatomic inability.   Stomach and partially visualized small bowel are normal.    08/02/2015 outside note indicating EGD but no details of the procedure

## 2022-06-06 ENCOUNTER — Telehealth (INDEPENDENT_AMBULATORY_CARE_PROVIDER_SITE_OTHER): Payer: Self-pay | Admitting: Hand Surgery

## 2022-06-06 ENCOUNTER — Telehealth (HOSPITAL_BASED_OUTPATIENT_CLINIC_OR_DEPARTMENT_OTHER): Payer: Self-pay | Admitting: Endocrinology

## 2022-06-06 NOTE — Telephone Encounter (Signed)
RETURN CALL: Voicemail - Detailed Message      SUBJECT:  Appointment Request     REASON FOR VISIT: f/u Post Op of Right Thumb and problems   PREFERRED DATE/TIME: ASAP   REASON UNABLE TO APPOINT:   Patient of Cassie Contreras, had surgery on her RIght Thumb Oct 2023 and is now having some problems, wants to speak with care team/make appt to have this looked at, asking for a call back to advise and assist    Thank You

## 2022-06-06 NOTE — Telephone Encounter (Signed)
I called and left a message to call us back to schedule a RFA consult and treatment options with Dr Jacqualine Mau    If patient calls back, please schedule the appt in the next available slot.    Thank you  [facility]

## 2022-06-06 NOTE — Telephone Encounter (Signed)
Scheduled patient per Dr Jacqualine Mau staff message. Advised patient appointment may need to be rescheduled depending on Dr Kandace Blitz response for appointment duration.      Cassie Contreras

## 2022-06-06 NOTE — Telephone Encounter (Signed)
RETURN CALL: Voicemail - Detailed Message      SUBJECT:  Medical Concerns/Information - Medical Concerns/Information      MESSAGE: Patient states that she is experiencing sharp pain in her right hand. She woke up in pain but it has gotten worse throughout the day. She would like to make an appointment. Pt states there is a bump where the tendon was severed from surgery and isn't sure if it's from this new pain or from surgery.

## 2022-06-06 NOTE — Telephone Encounter (Signed)
Open in error

## 2022-06-07 ENCOUNTER — Telehealth (HOSPITAL_BASED_OUTPATIENT_CLINIC_OR_DEPARTMENT_OTHER): Payer: Self-pay | Admitting: Endocrinology

## 2022-06-07 NOTE — Telephone Encounter (Signed)
Patient called to reschedule to sooner appointment.  Patient already in soonest appointment, placed on wait list.        Cassie Contreras

## 2022-06-07 NOTE — Telephone Encounter (Signed)
Left voicemail for patient Dr Jacqualine Mau needs 40 minutes for this appointment so it has been rescheduled to June 28 at 9:10am and to call clinic if reschedule needed.      Onalee Hua

## 2022-06-12 ENCOUNTER — Telehealth (INDEPENDENT_AMBULATORY_CARE_PROVIDER_SITE_OTHER): Payer: PPO | Admitting: Family Medicine

## 2022-06-13 ENCOUNTER — Ambulatory Visit (HOSPITAL_BASED_OUTPATIENT_CLINIC_OR_DEPARTMENT_OTHER): Payer: PPO | Admitting: Endocrinology

## 2022-06-14 ENCOUNTER — Ambulatory Visit (INDEPENDENT_AMBULATORY_CARE_PROVIDER_SITE_OTHER): Payer: PPO | Admitting: Orthopedic Surgery

## 2022-06-14 ENCOUNTER — Encounter (INDEPENDENT_AMBULATORY_CARE_PROVIDER_SITE_OTHER): Payer: Self-pay | Admitting: Orthopedic Surgery

## 2022-06-14 ENCOUNTER — Ambulatory Visit
Admission: RE | Admit: 2022-06-14 | Discharge: 2022-06-14 | Disposition: A | Discharge status: Home / Self Care | Payer: PPO | Source: Ambulatory Visit | Attending: Diagnostic Radiology | Admitting: Diagnostic Radiology

## 2022-06-14 VITALS — BP 147/85 | HR 70 | Temp 98.4°F | Ht 69.02 in | Wt 153.0 lb

## 2022-06-14 DIAGNOSIS — T148XXA Other injury of unspecified body region, initial encounter: Secondary | ICD-10-CM

## 2022-06-14 DIAGNOSIS — M25531 Pain in right wrist: Secondary | ICD-10-CM | POA: Insufficient documentation

## 2022-06-14 NOTE — Progress Notes (Signed)
Cassie Contreras is a 72 year old female who is here for new right wrist pain. Patient was last seen in clinic on 02/13/2022 for 31-month postop assessment s/p R thumb UCL repair (DOS 11/10/2021). She reports her thumb has been doing well after surgery. She does note a small mass that has developed in her palmar thenar webspace after surgery.     She was working out a couple weeks ago and felt a sharp pain in her wrist. She has osteoporosis, so is worried that she might have an injury. She had pain in the mid-dorsal wrist with wrist extension for a few days after the injury. Her pain started to improve and she returned to working out with lighter weights and wrist bracing. She was doing this successfully but aggravated her pain again when pushing a door closed. She denies any swelling during these past few weeks. She also denies any snapping or clicking in the wrist. She currently rates her pain as 4-10/10 in her right wrist that is sharp or aching. Sensation is normal in the hand.  She notes a decrease in strength in her wrist due to pain.       Social History: Does not smoke    ROS:   No new skin, neurologic, or musculoskeletal problems     Hand & Upper Extremity Examination    Physical Examination  Gen: Patient is healthy, alert, no distress    Psych: Alert and oriented times 3  Pleasant female. Mood and affect appropriate.    Skin: warm, normal color and sweat patterns.    Vascular: Fingers warm, pink, with brisk capillary refill    Neurologic: Sensation to light touch grossly intact over the median, radial, and ulnar distributions    Musculoskeletal:  No swelling over the right wrist.  Nontender over right distal radius, anatomic snuffbox, scaphoid tubercle, thumb CMC joint, DRUJ, ulnar fovea, ulnar head.  Tender over right dorsal lunate.  Small nodule over volar thenar webspace, no extension into digit, no flexion contracture  Symmetric forearm pronation/supination, wrist flexion/extension  Full composite  flexion/extension all digits  Scaphoid Watson shift test is negative  No pain with ulnar grind of right wrist      Studies: X-rays of the right wrist were reviewed and show no acute fracture, dislocation, or malalignment. Possible edema at proximal lunate.       Assessment:   5F with a couple weeks of mid-dorsal right wrist pain. No fracture on XR but possible proximal lunate edema could indicate bone contusion.     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 Monitor for now.     Plan:  - Reviewed exam and images with patient. Discussed that she does not appear to have a fracture on her XR but possible lunate bone contusion. Recommend returning to activities only as tolerated, but expect symptoms to gradually improve over the next 4-6 weeks. Wear a wrist brace only as needed, encouraging wrist motion to avoid stiffness.   - RTC if pain persists in a couple months, otherwise prn     Stevenson Clinch, MPAP PA-C  [facility] [employer] [facility] [employer] [facility]  Hand, Wrist, and Elbow Surgery

## 2022-06-22 ENCOUNTER — Telehealth: Payer: Self-pay | Admitting: Family Medicine

## 2022-06-22 NOTE — Telephone Encounter (Signed)
Patient's chart was reviewed for possible Lung Cancer Screening outreach -      No patient outreach needed because patient does not match eligibility criteria Quit smoking > 15 years ago  Chart Review indicates LCS outreach not necessary or appropriate due to pt doesn't qualify    Closing encounter

## 2022-06-29 ENCOUNTER — Telehealth (INDEPENDENT_AMBULATORY_CARE_PROVIDER_SITE_OTHER): Payer: Self-pay | Admitting: Family Medicine

## 2022-06-29 NOTE — Telephone Encounter (Signed)
Outreach: Phone Call to Patient  Care gaps: Annual Wellness Visit    Attempted to reach patient by MyChart message.    CCR / PSS - Please schedule the patient for the appropriate visit type for the Care Gap(s) listed above.    Closing encounter.

## 2022-07-21 ENCOUNTER — Ambulatory Visit: Payer: PPO | Admitting: Endocrinology

## 2022-07-23 ENCOUNTER — Encounter (INDEPENDENT_AMBULATORY_CARE_PROVIDER_SITE_OTHER): Payer: Self-pay | Admitting: Family Medicine

## 2022-07-23 ENCOUNTER — Encounter (INDEPENDENT_AMBULATORY_CARE_PROVIDER_SITE_OTHER): Payer: Self-pay | Admitting: Podiatrist

## 2022-07-23 DIAGNOSIS — M79672 Pain in left foot: Secondary | ICD-10-CM

## 2022-07-24 NOTE — Telephone Encounter (Signed)
Called patient and scheduled appointment on 7/17 at 1:45pm, I will keep an eye out for an sooner openings, patient let me know she is getting xrays on 7/9 at [facility] I advised her to elevate and she stated she has a boot, I told her if she would feel safe she can wear the boot while running errand and do activities that require her to use all her weight, at home she can use an ace bandage she stated she had an ankle brace I let her know she can try but I'm not sure it will support that area that is injured. She stated her understanding.

## 2022-07-25 ENCOUNTER — Emergency Department: Payer: Self-pay
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
## 2022-08-01 ENCOUNTER — Ambulatory Visit: Payer: PPO | Admitting: Endocrinology

## 2022-08-09 ENCOUNTER — Telehealth (HOSPITAL_COMMUNITY): Payer: Self-pay

## 2022-08-09 ENCOUNTER — Ambulatory Visit (INDEPENDENT_AMBULATORY_CARE_PROVIDER_SITE_OTHER): Payer: PPO | Admitting: Podiatrist

## 2022-08-09 ENCOUNTER — Telehealth (INDEPENDENT_AMBULATORY_CARE_PROVIDER_SITE_OTHER): Payer: Self-pay | Admitting: Family Medicine

## 2022-08-09 DIAGNOSIS — N390 Urinary tract infection, site not specified: Secondary | ICD-10-CM

## 2022-08-09 MED ORDER — CIPROFLOXACIN HCL 500 MG OR TABS
500.0000 mg | ORAL_TABLET | Freq: Two times a day (BID) | ORAL | 0 refills | Status: DC
Start: 2022-08-09 — End: 2022-08-23

## 2022-08-09 NOTE — Telephone Encounter (Signed)
Contacted by patient re recurrence of typical UTI symptoms.  At [facility] so unable to come in.  See order detail.

## 2022-08-09 NOTE — Telephone Encounter (Signed)
GI Health Assessment Questions    Estimated body mass index is 22.58 kg/m as calculated from the following:    Height as of 06/14/22: 5' 9.02" (1.753 m).    Weight as of 06/14/22: 69.4 kg (153 lb).        Patient Assessment      [place] CASES   If scheduling >3 weeks out, tell the patient following script:  If you have a positive COVID-19 test, an exposure to COVID-19, or develop symptoms of COVID-19 within 3 weeks of your procedure date, please give [place] a call at [phone].      Have you had a previous Endoscopy? Yes  If yes:  Where?   When?   Procedure Type? EGD and Colonoscopy    Are you taking any Anti-coagulant or Anti-Platelet Medications? Yes      Are you on dialysis? No      Do you have a Pacemaker or Defibrillator? No      Are you Diabetic?  No    Are you a difficult IV start? No      Are you able to transfer to a stretcher independently? Yes      PROCEDURE        Procedure MD: Dr Laury Axon  Procedure Type: EGD  Procedure Date:  08/30/22       Time: 1200    Procedure Check-In Time: 1130    Patient Teaching    Was the topic of blood thinners taught?  Yes     Reminder that family member or friend must escort patient? Yes     What is the name of their driver? will arrange    How were the procedure preparation instruction materials delivered?  Verbal: Yes  On Paper: No  eCare message: Yes  Email: No  E-mail address: -     ZipWhip Appointment Confirmation Sent: Yes     Does this procedure require bowel prep? No  If yes, were instructions for the ordered laxative taught? No  If yes, which RX was prescribed? N/A  Pharmacy? -     General Notes:

## 2022-08-15 ENCOUNTER — Encounter (HOSPITAL_BASED_OUTPATIENT_CLINIC_OR_DEPARTMENT_OTHER): Payer: PPO | Admitting: Endocrinology

## 2022-08-23 ENCOUNTER — Telehealth (INDEPENDENT_AMBULATORY_CARE_PROVIDER_SITE_OTHER): Payer: Auto Insurance (includes no fault) | Admitting: Family Medicine

## 2022-08-23 ENCOUNTER — Encounter (HOSPITAL_BASED_OUTPATIENT_CLINIC_OR_DEPARTMENT_OTHER): Payer: Self-pay | Admitting: Family Medicine

## 2022-08-23 ENCOUNTER — Encounter (INDEPENDENT_AMBULATORY_CARE_PROVIDER_SITE_OTHER): Payer: Self-pay | Admitting: Family Medicine

## 2022-08-23 DIAGNOSIS — S335XXA Sprain of ligaments of lumbar spine, initial encounter: Secondary | ICD-10-CM

## 2022-08-23 DIAGNOSIS — M810 Age-related osteoporosis without current pathological fracture: Secondary | ICD-10-CM

## 2022-08-23 DIAGNOSIS — N390 Urinary tract infection, site not specified: Secondary | ICD-10-CM

## 2022-08-23 DIAGNOSIS — F419 Anxiety disorder, unspecified: Secondary | ICD-10-CM

## 2022-08-23 MED ORDER — ESTRADIOL 10 MCG VA TABS
10.0000 ug | ORAL_TABLET | VAGINAL | 3 refills | Status: DC
Start: 2022-08-23 — End: 2023-07-18

## 2022-08-23 MED ORDER — CLONAZEPAM 0.5 MG OR TABS
0.5000 mg | ORAL_TABLET | Freq: Every day | ORAL | 0 refills | Status: AC | PRN
Start: 2022-08-23 — End: ?

## 2022-08-23 MED ORDER — CYCLOBENZAPRINE HCL 5 MG OR TABS
5.0000 mg | ORAL_TABLET | Freq: Three times a day (TID) | ORAL | 2 refills | Status: DC | PRN
Start: 2022-08-23 — End: 2023-02-14
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
## 2022-08-25 ENCOUNTER — Other Ambulatory Visit (INDEPENDENT_AMBULATORY_CARE_PROVIDER_SITE_OTHER): Payer: Self-pay | Admitting: Family Medicine

## 2022-08-25 ENCOUNTER — Encounter (INDEPENDENT_AMBULATORY_CARE_PROVIDER_SITE_OTHER): Payer: Self-pay | Admitting: Gastroenterology

## 2022-08-25 DIAGNOSIS — M810 Age-related osteoporosis without current pathological fracture: Secondary | ICD-10-CM

## 2022-08-25 LAB — CALCIUM: Calcium: 9.3 mg/dL (ref 8.9–10.2)

## 2022-08-25 LAB — CALCIUM, (REFLEXIVE IONIZED)

## 2022-08-30 ENCOUNTER — Ambulatory Visit
Admit: 2022-08-30 | Discharge: 2022-08-30 | Disposition: A | Discharge status: Home / Self Care | Payer: PPO | Attending: Family Medicine | Admitting: Family Medicine

## 2022-08-30 ENCOUNTER — Encounter (INDEPENDENT_AMBULATORY_CARE_PROVIDER_SITE_OTHER): Payer: Self-pay | Admitting: Gastroenterology

## 2022-08-30 DIAGNOSIS — R1310 Dysphagia, unspecified: Secondary | ICD-10-CM

## 2022-08-30 DIAGNOSIS — K219 Gastro-esophageal reflux disease without esophagitis: Secondary | ICD-10-CM

## 2022-08-30 LAB — VITAMIN D (25 HYDROXY)
Vit D (25_Hydroxy) Total: 36.9 ng/mL (ref 20.1–50.0)
Vitamin D2 (25_Hydroxy): <1 ng/mL
Vitamin D3 (25_Hydroxy): 36.9 ng/mL

## 2022-08-31 ENCOUNTER — Encounter (HOSPITAL_BASED_OUTPATIENT_CLINIC_OR_DEPARTMENT_OTHER): Payer: Self-pay | Admitting: Gastroenterology

## 2022-08-31 ENCOUNTER — Other Ambulatory Visit: Payer: Self-pay

## 2022-08-31 ENCOUNTER — Ambulatory Visit
Admission: RE | Admit: 2022-08-31 | Discharge: 2022-08-31 | Disposition: A | Discharge status: Home / Self Care | Payer: PPO | Attending: Gastroenterology | Admitting: Gastroenterology

## 2022-08-31 DIAGNOSIS — R1310 Dysphagia, unspecified: Secondary | ICD-10-CM | POA: Insufficient documentation

## 2022-08-31 DIAGNOSIS — K219 Gastro-esophageal reflux disease without esophagitis: Secondary | ICD-10-CM | POA: Insufficient documentation

## 2022-08-31 DIAGNOSIS — R69 Illness, unspecified: Secondary | ICD-10-CM | POA: Insufficient documentation

## 2022-08-31 DIAGNOSIS — R1319 Other dysphagia: Secondary | ICD-10-CM | POA: Insufficient documentation

## 2022-08-31 HISTORY — DX: Migraine, unspecified, not intractable, without status migrainosus: G43.909

## 2022-08-31 MED ORDER — GLUCAGON HCL (RDNA) 1 MG IJ SOLR
1.0000 mg | INTRAMUSCULAR | Status: DC | PRN
Start: 2022-08-31 — End: 2022-09-02

## 2022-08-31 MED ORDER — DEXTROSE 50 % IV SOLN
25.0000 mL | INTRAVENOUS | Status: DC | PRN
Start: 2022-08-31 — End: 2022-09-02

## 2022-08-31 MED ORDER — GLUCAGON HCL (RDNA) 1 MG IJ SOLR
0.2500 mg | INTRAMUSCULAR | Status: DC | PRN
Start: 2022-08-31 — End: 2022-09-02

## 2022-08-31 MED ORDER — NALOXONE HCL 0.4 MG/ML IJ SOLN
0.0400 mg | INTRAMUSCULAR | Status: DC | PRN
Start: 2022-08-31 — End: 2022-09-02

## 2022-08-31 MED ORDER — FENTANYL CITRATE (PF) 100 MCG/2ML IJ SOLN
25.0000 ug | INTRAMUSCULAR | Status: DC | PRN
Start: 2022-08-31 — End: 2022-09-01
  Administered 2022-08-31 (×3): 25 ug via INTRAVENOUS
  Administered 2022-08-31: 50 ug via INTRAVENOUS
  Administered 2022-08-31: 25 ug via INTRAVENOUS

## 2022-08-31 MED ORDER — INSULIN LISPRO 100 UNIT/ML IJ SOLN
2.0000 [IU] | INTRAMUSCULAR | Status: DC | PRN
Start: 2022-08-31 — End: 2022-09-02

## 2022-08-31 MED ORDER — EPINEPHRINE 1 MG/10ML IV SOSY
1.0000 mg | PREFILLED_SYRINGE | INTRAVENOUS | Status: DC | PRN
Start: 2022-08-31 — End: 2022-09-02

## 2022-08-31 MED ORDER — DEXTROSE 50 % IV SOLN
50.0000 mL | INTRAVENOUS | Status: DC | PRN
Start: 2022-08-31 — End: 2022-09-02

## 2022-08-31 MED ORDER — METOCLOPRAMIDE HCL 5 MG/ML IJ SOLN
10.0000 mg | Freq: Once | INTRAMUSCULAR | Status: DC | PRN
Start: 2022-08-31 — End: 2022-09-02

## 2022-08-31 MED ORDER — ONDANSETRON HCL 4 MG/2ML IJ SOLN
4.0000 mg | Freq: Three times a day (TID) | INTRAMUSCULAR | Status: DC | PRN
Start: 2022-08-31 — End: 2022-09-01

## 2022-08-31 MED ORDER — FLUMAZENIL 0.5 MG/5ML IV SOLN
0.2000 mg | INTRAVENOUS | Status: DC | PRN
Start: 2022-08-31 — End: 2022-09-02

## 2022-08-31 MED ORDER — ATROPINE SULFATE 1 MG/10ML IJ SOSY
0.5000 mg | PREFILLED_SYRINGE | INTRAMUSCULAR | Status: DC | PRN
Start: 2022-08-31 — End: 2022-09-01

## 2022-08-31 MED ORDER — FENTANYL CITRATE (PF) 100 MCG/2ML IJ SOLN
INTRAMUSCULAR | Status: AC
Start: 2022-08-31 — End: 2022-08-31
  Filled 2022-08-31: qty 4

## 2022-08-31 MED ORDER — LIDOCAINE HCL URETHRAL/MUCOSAL 2 % EX PRSY
6.0000 mL | PREFILLED_SYRINGE | Freq: Once | CUTANEOUS | Status: DC | PRN
Start: 2022-08-31 — End: 2022-09-02

## 2022-08-31 MED ORDER — GLUCAGON HCL (RDNA) 1 MG IJ SOLR
0.5000 mg | INTRAMUSCULAR | Status: DC | PRN
Start: 2022-08-31 — End: 2022-09-02

## 2022-08-31 MED ORDER — DIPHENHYDRAMINE HCL 50 MG/ML IJ SOLN
25.0000 mg | Freq: Once | INTRAMUSCULAR | Status: DC | PRN
Start: 2022-08-31 — End: 2022-09-02

## 2022-08-31 MED ORDER — MIDAZOLAM HCL (PF) 5 MG/5ML IJ SOLN
INTRAMUSCULAR | Status: AC
Start: 2022-08-31 — End: 2022-08-31
  Filled 2022-08-31: qty 10

## 2022-08-31 MED ORDER — ONDANSETRON 4 MG OR TBDP
4.0000 mg | ORAL_TABLET | Freq: Once | ORAL | Status: DC | PRN
Start: 2022-08-31 — End: 2022-09-02

## 2022-08-31 MED ORDER — SODIUM CHLORIDE 0.9 % IV SOLN
125.0000 mL/h | INTRAVENOUS | Status: DC
Start: 2022-08-31 — End: 2022-09-01
  Administered 2022-08-31: 125 mL/h via INTRAVENOUS

## 2022-08-31 MED ORDER — MIDAZOLAM HCL (PF) 5 MG/5ML IJ SOLN
1.0000 mg | INTRAMUSCULAR | Status: DC | PRN
Start: 2022-08-31 — End: 2022-09-01
  Administered 2022-08-31: 2 mg via INTRAVENOUS
  Administered 2022-08-31 (×5): 1 mg via INTRAVENOUS

## 2022-08-31 MED ORDER — HEPARIN NA (PORK) LOCK FLUSH 100 UNIT/ML IV SOLN (WRAPPER)
5.0000 mL | Freq: Once | INTRAVENOUS | Status: DC | PRN
Start: 2022-08-31 — End: 2022-09-02

## 2022-08-31 NOTE — H&P (Signed)
Moderate Sedation Pre-Procedure Assessment/H&P    Procedure  EGD [GI2] oropharyngeal dysphagia for 1 year to solids intermittently    Sedation RN Data     Patient Language English   HISTORY   Problem List   Diagnosis    Os peroneum syndrome of left foot    Neuritis of left sural nerve    Recurrent UTI    Osteopenia of spine    Atrophic vaginitis    Post-menopause on HRT (hormone replacement therapy)    Thyroid nodule    Mixed incontinence    Pelvic floor weakness    Obstructive sleep apnea    Benign hypertension    Anxiety    Breast lump on right side at 11 o'clock position    Migraine with aura and without status migrainosus, not intractable    New onset of headaches after age 64    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)    Rupture of ulnar collateral ligament of right thumb    Stiffness of finger joint of right hand    Low back pain    Age-related osteoporosis without current pathological fracture       Past Surgical History:   Procedure Laterality Date    PR UNLISTED PROCEDURE FOOT/TOES      PR UNLISTED PROCEDURE HANDS/FINGERS      PR UNLISTED PROCEDURE LEG/ANKLE      Tonsilectomy      Childhood    URETHRAL DILATION/DVIU         Social History     Tobacco Use    Smoking status: Former     Current packs/day: 0.00     Average packs/day: 0.5 packs/day for 15.0 years (7.5 ttl pk-yrs)     Types: Cigarettes     Start date: 01/24/1976     Quit date: 01/24/1991     Years since quitting: 31.6     Passive exposure: Past    Smokeless tobacco: Never    Tobacco comments:     I smoked for about 10 years, quit for about 6 years and smoked again for about 5. Ended 1993   Substance and Sexual Activity    Alcohol use: Yes     Alcohol/week: 3.0 standard drinks of alcohol     Types: 3 Glasses of wine per week     Comment: occ.    Drug use: No    Sexual activity: Yes     Partners: Male       Family History   Problem Relation Age of Onset    Heart Attack Sister 45        sudden heart attack in her sleep          Cannot display  prior to admission medications because the patient has not been admitted in this contact.       Inpatient Medications   SCHEDULED MEDICATIONS:       INFUSED MEDICATIONS:    sodium chloride, 125 mL/hr, Continuous, Last Rate: 125 mL/hr (08/31/22 1340)     PRN MEDICATIONS:    atropine, 0.5 mg, PRN    atropine, 0.5 mg, PRN    dextrose, 25 mL, PRN    dextrose, 50 mL, PRN    diphenhydrAMINE, 25 mg, Once PRN    EPINEPHrine, 1 mg, PRN    EPINEPHrine, 1 mg, PRN    fentaNYL PF, 25-50 mcg, PRN    flumazenil, 0.2 mg, q1 min PRN    glucagon, 0.25 mg, PRN    glucagon,  0.25 mg, PRN    glucagon, 0.5 mg, PRN    glucagon, 1 mg, PRN    heparin flush, 5 mL, Once PRN    insulin LISPRO, 2-6 units, PRN    lidocaine, 6 mL, Once PRN    lidocaine, 6 mL, Once PRN    metoclopramide, 10 mg, Once PRN    midazolam, 1-2 mg, PRN    naloxone, 0.04 mg, q2 min PRN    ondansetron, 4-8 mg, Once PRN    ondansetron, 4-8 mg, q8h PRN       Anticoagulants   Is patient on anticoagulation meds?: (not recorded)  Anticoagulation labs and last medication admin verified?: (not recorded)     OB/GYN   OB/GYN Status: Postmenopausal; 08/31/2022   Lactation Status: The patient was not asked if she was breastfeeding.    Provider Reviewed: Yes         NPO Status   Date of Last Liquid: 08/31/22  Time of Last Liquid: 1000  Date of Last Solid: 08/30/22  Time of Last Solid: 2300     Activity Tolerance   Assessment of climbing up 2 flights of stairs: (not recorded)  Assessment of walking 2 blocks: (not recorded)     Escort/Ride Home Info   Escort/Ride home contact name: Brett Canales  Escort/Ride home contact relationship: spouse  Escort/Ride home contact number(s): [phone]  Escort/Ride home contact location: car at [address]     Sedation Provider Statement  I concur with the RN's pre-procedure assessment above.    [facility] PRESEDATION PE    Vitals:   Vitals (Most recent in last 24 hrs)     T: 36.8 C (08/31/22 1331)  BP: 123/81 (08/31/22 1331)  HR: 84 (08/31/22 1331)  RR: 16 (08/31/22 1331)   SpO2: 99 % (08/31/22 1331) Room air  T range: Temp  Min: 36.8 C  Max: 36.8 C  Admit weight: 69.4 kg (153 lb) (08/31/22 1331)  Last weight: 69.4 kg (153 lb) (08/31/22 1331)       Recent Labs  Lab Results   Component Value Date    WBC 11.12 (H) 09/11/2016    HEMOGLOBIN 11.8 09/11/2016    HEMATOCRIT 34 (L) 09/11/2016    PLATELET 344 09/11/2016    SODIUM 131 (L) 06/02/2022    POTASSIUM 5.5 (H) 06/02/2022    BUN 18 06/02/2022    CREATININE 0.71 06/02/2022    GLUCOSE 89 06/02/2022    AST 25 06/02/2022    ALT 23 06/02/2022    ALK 97 06/02/2022    ALBUMIN 4.1 06/02/2022       Physical Exam   Airway:  Mallampati: Class 2: Visibility of hard and soft palate, upper portion of tonsils and uvula      Cardiovascular: Normal    Respiratory: Normal    Abdominal: soft    Neurological/Neuromuscular: Normal    Additional findings: na    Risk Factors  Status: Low:  ASA 1.  Normal healthy patient    Assessment: Okay for sedation    Plan for Sedation: Moderate sedation.    Attestation  I am a Patent attorney (Attending, ARNP, PA-C, [facility] RN) with privileges for moderate sedation.    Assessment Update  Procedure more than 1 hour from above assessment: No changes

## 2022-09-04 NOTE — Addendum Note (Signed)
Encounter addended by: Dinah Beers on: 09/04/2022 9:04 AM   Actions taken: SmartForm saved

## 2022-09-06 LAB — PATHOLOGY, SURGICAL

## 2022-09-07 ENCOUNTER — Encounter (INDEPENDENT_AMBULATORY_CARE_PROVIDER_SITE_OTHER): Payer: Self-pay | Admitting: Gastroenterology

## 2022-09-07 NOTE — Addendum Note (Signed)
Encounter addended by: Abran Richard Hope on: 09/07/2022 1:25 PM   Actions taken: SmartForm saved

## 2022-09-07 NOTE — Addendum Note (Signed)
Encounter addended by: Vivia Birmingham on: 09/07/2022 3:01 PM   Actions taken: Charge Capture section accepted

## 2022-09-13 ENCOUNTER — Other Ambulatory Visit (HOSPITAL_BASED_OUTPATIENT_CLINIC_OR_DEPARTMENT_OTHER): Payer: Self-pay

## 2022-10-02 ENCOUNTER — Encounter (INDEPENDENT_AMBULATORY_CARE_PROVIDER_SITE_OTHER): Payer: Self-pay | Admitting: Family Medicine

## 2022-10-02 NOTE — Telephone Encounter (Signed)
Routing to provider to review.    Please advise, thank you.

## 2022-10-03 ENCOUNTER — Other Ambulatory Visit (HOSPITAL_BASED_OUTPATIENT_CLINIC_OR_DEPARTMENT_OTHER): Payer: Self-pay

## 2022-10-11 ENCOUNTER — Other Ambulatory Visit (INDEPENDENT_AMBULATORY_CARE_PROVIDER_SITE_OTHER): Payer: Self-pay | Admitting: Neurology

## 2022-10-11 ENCOUNTER — Ambulatory Visit
Admit: 2022-10-11 | Discharge: 2022-10-11 | Disposition: A | Discharge status: Home / Self Care | Payer: PPO | Attending: Family Medicine | Admitting: Family Medicine

## 2022-10-11 VITALS — BP 132/75 | HR 60 | Temp 96.3°F | Resp 16

## 2022-10-11 DIAGNOSIS — M810 Age-related osteoporosis without current pathological fracture: Secondary | ICD-10-CM | POA: Insufficient documentation

## 2022-10-11 DIAGNOSIS — R519 Headache, unspecified: Secondary | ICD-10-CM

## 2022-10-11 MED ORDER — DIPHENHYDRAMINE HCL 50 MG/ML IJ SOLN
50.0000 mg | INTRAMUSCULAR | Status: DC | PRN
Start: 2022-10-11 — End: 2022-10-11

## 2022-10-11 MED ORDER — CALCIUM CARBONATE ANTACID 500 MG OR CHEW
500.0000 mg | CHEWABLE_TABLET | Freq: Once | ORAL | Status: AC
Start: 2022-10-11 — End: 2022-10-11
  Administered 2022-10-11: 500 mg via ORAL
  Filled 2022-10-11: qty 1

## 2022-10-11 MED ORDER — ZOLEDRONIC ACID 5 MG/100ML IV SOLN
5.0000 mg | Freq: Once | INTRAVENOUS | Status: AC
Start: 2022-10-11 — End: 2022-10-11
  Administered 2022-10-11: 5 mg via INTRAVENOUS
  Filled 2022-10-11: qty 100

## 2022-10-11 MED ORDER — ACETAMINOPHEN 325 MG OR TABS
650.0000 mg | ORAL_TABLET | ORAL | Status: DC | PRN
Start: 2022-10-11 — End: 2022-10-11

## 2022-10-11 MED ORDER — METHYLPREDNISOLONE NA SUC (PF) 125 MG IJ SOLR
125.0000 mg | Freq: Once | INTRAMUSCULAR | Status: DC | PRN
Start: 2022-10-11 — End: 2022-10-11

## 2022-10-11 MED ORDER — ACETAMINOPHEN 325 MG OR TABS
650.0000 mg | ORAL_TABLET | Freq: Once | ORAL | Status: AC
Start: 2022-10-11 — End: 2022-10-11
  Administered 2022-10-11: 650 mg via ORAL
  Filled 2022-10-11: qty 2

## 2022-10-11 MED ORDER — EPINEPHRINE (ANAPHYLAXIS) 1 MG/ML IJ SOLN
0.3000 mg | Freq: Once | INTRAMUSCULAR | Status: DC | PRN
Start: 2022-10-11 — End: 2022-10-11

## 2022-10-11 MED ORDER — DIPHENHYDRAMINE HCL 50 MG OR CAPS
50.0000 mg | ORAL_CAPSULE | ORAL | Status: DC | PRN
Start: 2022-10-11 — End: 2022-10-11

## 2022-10-11 NOTE — Progress Notes (Signed)
INFUSION RN NOTE    VISIT INFORMATION    Visit Diagnosis: (M81.0) Age-related osteoporosis without current pathological fracture  (primary encounter diagnosis)    Vitals:   Vitals:    10/11/22 1014   Temp: (!) 35.7 C   Pulse: 60   BP: 132/75   Resp: 16   SpO2: 100%       Review of patient's allergies indicates:  Allergies   Allergen Reactions    Erythromycin Other     Strange smells but no rash or trouble breathing.    Ibuprofen      TX FROM PAPER CHART      Pneumovax [Pneumococcal Polysaccharide Vaccine]      Rash/fever (arm)     Sulfa Antibiotics      TX FROM PAPER CHART         MAR Administered Medications:  Medication Administrations This Visit         acetaminophen (Tylenol) tablet 650 mg Admin Date  10/11/2022  08:56 Action  Given Dose  650 mg Rate   Route  Oral Site      Documented By: Jeralyn Ruths, RN    Ordering Provider: [name], MD    ND[facility]: [phone], [phone]    Lot#: ,         calcium carbonate (Tums) chewable tablet 500 mg Admin Date  10/11/2022  08:57 Action  Given Dose  500 mg Rate   Route  Oral Site      Documented By: Jeralyn Ruths, RN    Ordering Provider: [name], MD    [facility]: [phone]        zoledronic acid 5 mg in 100 mL IVPB (premix) Admin Date  10/11/2022  09:06 Action  New Bag/Syringe Dose  5 mg Rate  200 mL/hr Route  Intravenous Site      Documented By: Jeralyn Ruths, RN    Ordering Provider: [name], MD    [facility]: [phone]                10/11/2022     9:10 AM   Nursing Assessement   Orientation Level Oriented X4   Respiratory Pattern Normal   Respiratory Depth/Rhythm Regular   Head of Bed Elevated Self regulated   Integumentary (WDL) WDL   Wound 11/10/21 Incision Finger (Comment which one) Anterior;Right Wound 11/10/21 Incision Finger (Comment which one) Anterior;Right:  Date First Assessed: 11/10/21   Primary Wound Type: Incision   thumb   Location: Finger (Comment which one)   Wound Location Orientation: Anterior;Right   Musculoskeletal  (WDL) WDL   Brace/Splint 11/10/21 Hand Anterior;Right Short;Splint Brace/Splint 11/10/21 Hand Anterior;Right Short;Splint:  Placement Date: 11/10/21   Placement Time: 10:38   Location: Hand   Orientation: Anterior;Right   Type: Short;Splint   Psychosocial (WDL) WDL       Comment       LABS:  No results found for: "CBCDIF", "CMPBRCA12R", "BMPROC", "MAGNESIUM", "PHOSPHATE", "CRP"    Laboratory Results Reviewed: Yes    Notes: Cassie Contreras arrived 4S on time, ambulatory, accompanied, here for Reclast infusion.    Labs are w/in parameters to infuse.    Oriented to room and call light within reach.    Questionnaire    Special instructions regarding Zoledronic Acid IV administration:  - Has the patient had recent severe diarrhea/vomiting? NO  - Has the patient been hospitalized within last 30 days of the infusion?  NO  - Has the patient had tooth extractions or dental implants done within  the last 60 days of the infusion? NO    If yes, to any of the above questions, please notify provider before infusing.   - Encourage Oral Hydration during and post infusion.    Provided and reviewed with Pt medication handout and signs and symptoms of reaction to report. Pt indicated understanding.    Premedicated Pt, see above.    IV PLACEMENT: IV placed uneventfully, Total number of attempts: 1, and Warmed with Hot Pack, blanket prior to insertion    Completed 5 rights and reviewed w/Pt label on bag of medication confirming their name, DOB, medication and dose.    Infused medication per pharmacy rate recommendation.     PATIENT TOLERANCE: Patient tolerated infusion well with no signs and symptoms of infusion reaction observed or reported adverse effect and Patient monitored for 30 minutes post infusion    Pt aware to go to ED if they develop fevers/chills, SOB, lightheadedness, dizziness, chest pain, swelling of lips/tongue/throat.    PLAN: Infusion series complete    Patient left unit independently: Ambulatory    Digitally signed by: Jeralyn Ruths, RN

## 2022-10-11 NOTE — Telephone Encounter (Signed)
90 days supply request

## 2022-10-12 MED ORDER — MEMANTINE HCL 10 MG OR TABS
10.0000 mg | ORAL_TABLET | Freq: Every day | ORAL | 1 refills | Status: DC
Start: 2022-10-12 — End: 2023-05-15

## 2022-10-22 NOTE — Progress Notes (Signed)
 Subjective:     Chief Complaint   Patient presents with   . Knee Pain     Left, started on Wed   . Foot Pain     Left, started friday       HPI this is a very pleasant 72 year old lady presents today complaining of left knee and left ankle pain.  She associates them with returning to the gym after about a 1-month hiatus.  She is seeing a Systems analyst working out twice a week and then working out on her own a couple other days a week.  She had some diffuse knee pain for a couple of days but notes its better today.  She has been taking anti-inflammatories and notes that they are helpful.  She also had some pain posterior aspect of her left ankle.  She does have a history of peroneal tendinitis.  This area is tender but also not significantly problematic at this point.  She did have an old walking boot and is wearing that.  There is no specific trauma she has not noticed swelling redness or bruising.    The following portions of the patient's history were reviewed and updated as appropriate: allergies, current medications, past family history, past medical history, past social history, past surgical history and problem list.    Review of Systems as above        Objective:    Physical Exam  BP (!) 147/72 (BP Location: Left arm, Patient Position: Sitting, CUFF SIZE: Adult)   Pulse 66   Temp 36.7 C (98.1 F) (Temporal Artery (forehead))   Resp 16   Wt 72.1 kg (159 lb)   SpO2 97% left knee is grossly unremarkable.  There is no overt effusion erythema or ecchymosis.  No joint line tenderness.  Negative anterior posterior drawer test.  Negative pivot shift.  Negative varus and valgus stress test.  No popliteal mass.  Negative Homans' sign.     Left ankle also appears unremarkable without swelling bruising or redness.  Medial and lateral malleoli are nontender.  The posterior calcaneus and the insertion of the Achilles is nontender.  No tenderness at the styloid process of the fifth metatarsal.  Radial and ulnar  pulses are brisk.  Anterior talofibular ligament stable and nontender.  Minimal tenderness posteriorly at the lateral epicondyle soft tissue consistent with her prior diagnosis of peroneal tendinitis.  However this is mild.  She has full range of motion of the ankle with normal strength.        Assessment/Plan:      1. Inflammation of joint of left knee        2. Peroneal tendinitis, left                Ankle and knee pain consistent with overuse/inflammation.  Recommend continuing with ibuprofen, 400 mg 3 times daily with food for the next couple of days.  Activity as tolerated.  No indication for imaging.  I see no real reason to wear the boot at this point patient is educated.  Follow-up as needed            Note to patients: This record is meant to be a form of communication between members of your medical care team, including specialists and primary doctors, to help coordinate medical care in a concise and efficient manner. There may be some medical terms or abbreviations which people who are not familiar with the terminology used in the medical field may not recognize or find  confusing.  If you have any questions about the meaning of medical terms or abbreviations used within this note, please reach out to [facility] or Dr Romeo Apple. Also you should be aware that this   document was generated in part using voice recognition software. With that said, errors in dictation may occur and may be missed when clinic notes are created.     Electronically signed by Donnella Sham, DO on 10/22/2022 at 1:12 PM.

## 2022-10-27 ENCOUNTER — Telehealth (HOSPITAL_BASED_OUTPATIENT_CLINIC_OR_DEPARTMENT_OTHER): Payer: Self-pay | Admitting: Urology

## 2022-10-27 NOTE — Telephone Encounter (Signed)
Patient called in wanted to schedule a follow up with Dr. Hyacinth Meeker. Got her schedule and book for the telemed.

## 2022-10-29 ENCOUNTER — Encounter (INDEPENDENT_AMBULATORY_CARE_PROVIDER_SITE_OTHER): Payer: Self-pay | Admitting: Family Medicine

## 2022-10-29 DIAGNOSIS — N390 Urinary tract infection, site not specified: Secondary | ICD-10-CM

## 2022-10-29 MED ORDER — NITROFURANTOIN MONOHYD MACRO 100 MG OR CAPS
100.0000 mg | ORAL_CAPSULE | Freq: Two times a day (BID) | ORAL | 0 refills | Status: AC
Start: 2022-10-29 — End: 2022-11-04

## 2022-10-30 NOTE — Progress Notes (Signed)
Distant Site Telemedicine Encounter  I conducted this encounter via secure, live, face-to-face video conference with the patient. I reviewed the risks and benefits of telemedicine as pertinent to this visit and the patient agreed to proceed.    Provider Location: On-site location (clinic, hospital, on-site office)  Patient Location: At home    Present with patient: No one else present      72 yo woman with h/o mixed urinary incontinence, lifelong utis and ? neuromuscular obstructive outlet dysfunction on vaginal estrogen/vagifem with intermittent PFPT who is requesting refill of her vaginal estrogen. She was last seen 02/2021.Telemed visit 03/2022 doing well-continued on vaginal estrogen.   Did have citrobacter uti 04/2022

## 2022-10-30 NOTE — Progress Notes (Signed)
Patient contacted me re recurrence of typical UTI symptoms.  Meds as rx'd.

## 2022-10-31 ENCOUNTER — Telehealth (HOSPITAL_BASED_OUTPATIENT_CLINIC_OR_DEPARTMENT_OTHER): Payer: PPO | Admitting: Urology

## 2022-10-31 DIAGNOSIS — N390 Urinary tract infection, site not specified: Secondary | ICD-10-CM

## 2022-10-31 MED ORDER — NITROFURANTOIN MONOHYD MACRO 100 MG OR CAPS
100.0000 mg | ORAL_CAPSULE | Freq: Two times a day (BID) | ORAL | 3 refills | Status: AC
Start: 2022-10-31 — End: 2022-11-05

## 2022-11-06 ENCOUNTER — Other Ambulatory Visit: Payer: Self-pay

## 2022-11-16 ENCOUNTER — Telehealth (INDEPENDENT_AMBULATORY_CARE_PROVIDER_SITE_OTHER): Payer: Self-pay | Admitting: Family Medicine

## 2022-11-16 DIAGNOSIS — B349 Viral infection, unspecified: Secondary | ICD-10-CM

## 2022-11-16 MED ORDER — VALACYCLOVIR HCL 500 MG OR TABS
1000.0000 mg | ORAL_TABLET | Freq: Two times a day (BID) | ORAL | 3 refills | Status: AC
Start: 2022-11-16 — End: ?

## 2022-11-16 MED ORDER — VALACYCLOVIR HCL 500 MG OR TABS
500.0000 mg | ORAL_TABLET | Freq: Every day | ORAL | 1 refills | Status: DC
Start: 2022-11-16 — End: 2022-11-16

## 2022-11-16 NOTE — Telephone Encounter (Signed)
S/w patient, recent flare but took left over expired Valtrex w some effect but keeps recurring.  Took only 500mg  a day. Likely under dosing.  Meds as rx'd. Side effects reviewed. Notify me if repeatedly needs to rescue to consider daily prophylaxis.

## 2022-11-18 ENCOUNTER — Encounter (INDEPENDENT_AMBULATORY_CARE_PROVIDER_SITE_OTHER): Payer: Self-pay | Admitting: Family Medicine

## 2022-11-18 ENCOUNTER — Other Ambulatory Visit (INDEPENDENT_AMBULATORY_CARE_PROVIDER_SITE_OTHER): Payer: PPO

## 2022-11-18 DIAGNOSIS — N390 Urinary tract infection, site not specified: Secondary | ICD-10-CM

## 2022-11-18 LAB — URINALYSIS WITH REFLEX CULTURE
Bilirubin (Qual), URN: NEGATIVE
Epith Cells_Renal/Trans,URN: NEGATIVE /HPF
Glucose Qual, URN: NEGATIVE
Ketones, URN: NEGATIVE
Leukocyte Esterase, URN: POSITIVE — AB
Nitrite, URN: NEGATIVE
Protein (Alb Semiquant), URN: NEGATIVE
Specific Gravity, URN: 1.008 g/mL (ref 1.006–1.027)
pH, URN: 7 (ref 5.0–8.0)

## 2022-11-18 MED ORDER — NITROFURANTOIN MONOHYD MACRO 100 MG OR CAPS
100.0000 mg | ORAL_CAPSULE | Freq: Two times a day (BID) | ORAL | 0 refills | Status: DC
Start: 2022-11-18 — End: 2022-11-30

## 2022-11-18 MED ORDER — PHENAZOPYRIDINE HCL 100 MG OR TABS
100.0000 mg | ORAL_TABLET | Freq: Three times a day (TID) | ORAL | 0 refills | Status: AC | PRN
Start: 2022-11-18 — End: 2022-11-25

## 2022-11-18 NOTE — Progress Notes (Signed)
Drop off urine sample

## 2022-11-19 LAB — REFLEX CULTURE FOR UA

## 2022-11-20 NOTE — Progress Notes (Signed)
 S/w patient, recurrent UTI.  UA and cx obtained at [facility].  Macrobid rxd.

## 2022-11-21 LAB — URINE C/S: Culture: >100000 — AB

## 2022-11-29 ENCOUNTER — Encounter (INDEPENDENT_AMBULATORY_CARE_PROVIDER_SITE_OTHER): Payer: Self-pay | Admitting: Family Medicine

## 2022-11-29 NOTE — Telephone Encounter (Signed)
Routing to PCP

## 2022-11-30 ENCOUNTER — Telehealth (INDEPENDENT_AMBULATORY_CARE_PROVIDER_SITE_OTHER): Payer: Self-pay | Admitting: Family Medicine

## 2022-11-30 ENCOUNTER — Telehealth (INDEPENDENT_AMBULATORY_CARE_PROVIDER_SITE_OTHER): Payer: Self-pay

## 2022-11-30 DIAGNOSIS — B3731 Acute candidiasis of vulva and vagina: Secondary | ICD-10-CM

## 2022-11-30 DIAGNOSIS — N390 Urinary tract infection, site not specified: Secondary | ICD-10-CM

## 2022-11-30 MED ORDER — NITROFURANTOIN MONOHYD MACRO 100 MG OR CAPS
100.0000 mg | ORAL_CAPSULE | Freq: Two times a day (BID) | ORAL | 0 refills | Status: AC
Start: 2022-11-30 — End: 2022-12-07

## 2022-11-30 NOTE — Telephone Encounter (Signed)
Patient contacted me re recurrence of UTI symptoms.  Recent cx shows pan-susceptibility.  See order detail.

## 2022-11-30 NOTE — Telephone Encounter (Signed)
 RETURN CALL: Voicemail - Detailed Message      SUBJECT:  Appointment Request     REASON FOR VISIT: Courtesy labs for Dr. Pablo Lawrence DATE/TIME: Tomorrow 11/8  REASON UNABLE TO APPOINT: [facility] unable to find any appointments until Sunday, which is too late per patient. Please call to discuss

## 2022-11-30 NOTE — Telephone Encounter (Signed)
LVM for patient to return call to clinic to schedule     Meredith Pel, CMA

## 2022-12-01 ENCOUNTER — Other Ambulatory Visit (INDEPENDENT_AMBULATORY_CARE_PROVIDER_SITE_OTHER): Payer: PPO

## 2022-12-01 MED ORDER — FLUCONAZOLE 150 MG OR TABS
150.0000 mg | ORAL_TABLET | Freq: Once | ORAL | 0 refills | Status: AC
Start: 2022-12-01 — End: 2022-12-01

## 2022-12-01 NOTE — Telephone Encounter (Signed)
Scheduled.     Closing encounter.

## 2022-12-01 NOTE — Telephone Encounter (Signed)
S/w patient, wondering if current symptoms may be yeast infection: painful w urination.  S/p abx 2 wks ago so wondering if may be yeast.  Diflucan rxd.  Abx if not better

## 2022-12-01 NOTE — Addendum Note (Signed)
Addended by: [name] on: 12/01/2022 08:05 AM     Modules accepted: Orders

## 2022-12-16 ENCOUNTER — Encounter (INDEPENDENT_AMBULATORY_CARE_PROVIDER_SITE_OTHER): Payer: Self-pay | Admitting: Family Medicine

## 2022-12-24 DEATH — deceased

## 2023-01-22 ENCOUNTER — Other Ambulatory Visit (HOSPITAL_BASED_OUTPATIENT_CLINIC_OR_DEPARTMENT_OTHER): Payer: Self-pay | Admitting: Urology

## 2023-01-22 ENCOUNTER — Other Ambulatory Visit
Admission: RE | Admit: 2023-01-22 | Discharge: 2023-01-22 | Disposition: A | Discharge status: Home / Self Care | Payer: PPO | Source: Ambulatory Visit | Attending: Urology | Admitting: Urology

## 2023-01-22 ENCOUNTER — Other Ambulatory Visit (INDEPENDENT_AMBULATORY_CARE_PROVIDER_SITE_OTHER): Payer: PPO

## 2023-01-22 ENCOUNTER — Encounter (INDEPENDENT_AMBULATORY_CARE_PROVIDER_SITE_OTHER): Payer: Self-pay | Admitting: Family Medicine

## 2023-01-22 DIAGNOSIS — N390 Urinary tract infection, site not specified: Secondary | ICD-10-CM | POA: Insufficient documentation

## 2023-01-22 LAB — URINALYSIS WITH REFLEX CULTURE
Bacteria, URN: NONE SEEN
Bilirubin (Qual), URN: NEGATIVE
Epith Cells_Renal/Trans,URN: NEGATIVE /HPF
Epith Cells_Squamous, URN: NEGATIVE /LPF
Glucose Qual, URN: NEGATIVE
Leukocyte Esterase, URN: POSITIVE — AB
Nitrite, URN: NEGATIVE
Occult Blood, URN: NEGATIVE
Protein (Alb Semiquant), URN: NEGATIVE
RBC, URN: NEGATIVE /HPF
Specific Gravity, URN: 1.014 g/mL (ref 1.006–1.027)
pH, URN: 7 (ref 5.0–8.0)

## 2023-01-22 LAB — REFLEX CULTURE FOR UA

## 2023-01-22 MED ORDER — NITROFURANTOIN MONOHYD MACRO 100 MG OR CAPS
100.0000 mg | ORAL_CAPSULE | Freq: Two times a day (BID) | ORAL | 0 refills | Status: AC
Start: 2023-01-22 — End: 2023-01-29

## 2023-01-24 LAB — URINE C/S
Culture: 1000 — AB
Culture: 11000 — AB

## 2023-01-25 NOTE — Progress Notes (Signed)
 Patient contacted me reUTI symptoms.  See labs and order details

## 2023-02-04 ENCOUNTER — Encounter (INDEPENDENT_AMBULATORY_CARE_PROVIDER_SITE_OTHER): Payer: Self-pay | Admitting: Family Medicine

## 2023-02-04 DIAGNOSIS — N390 Urinary tract infection, site not specified: Secondary | ICD-10-CM

## 2023-02-04 MED ORDER — NITROFURANTOIN MONOHYD MACRO 100 MG OR CAPS
100.0000 mg | ORAL_CAPSULE | Freq: Two times a day (BID) | ORAL | 0 refills | Status: AC
Start: 2023-02-04 — End: 2023-02-11

## 2023-02-05 ENCOUNTER — Other Ambulatory Visit (HOSPITAL_BASED_OUTPATIENT_CLINIC_OR_DEPARTMENT_OTHER): Payer: Self-pay | Admitting: Urology

## 2023-02-05 ENCOUNTER — Other Ambulatory Visit
Admission: RE | Admit: 2023-02-05 | Discharge: 2023-02-05 | Disposition: A | Discharge status: Home / Self Care | Payer: PPO | Source: Ambulatory Visit | Attending: Urology | Admitting: Urology

## 2023-02-05 ENCOUNTER — Telehealth (INDEPENDENT_AMBULATORY_CARE_PROVIDER_SITE_OTHER): Payer: Self-pay | Admitting: Family Medicine

## 2023-02-05 DIAGNOSIS — N390 Urinary tract infection, site not specified: Secondary | ICD-10-CM | POA: Insufficient documentation

## 2023-02-05 LAB — URINALYSIS WITH REFLEX CULTURE
Bacteria, URN: NONE SEEN
Bilirubin (Qual), URN: NEGATIVE
Epith Cells_Renal/Trans,URN: NEGATIVE /HPF
Glucose Qual, URN: NEGATIVE
Ketones, URN: NEGATIVE
Leukocyte Esterase, URN: NEGATIVE
Nitrite, URN: NEGATIVE
Occult Blood, URN: NEGATIVE
Protein (Alb Semiquant), URN: NEGATIVE
RBC, URN: NEGATIVE /HPF
Specific Gravity, URN: 1.007 g/mL (ref 1.006–1.027)
WBC, URN: NEGATIVE /HPF
pH, URN: 7 (ref 5.0–8.0)

## 2023-02-05 NOTE — Progress Notes (Signed)
 Patient contacted me re symptoms c/w another UTI.  Abx sent and will coordinate a visit to discuss reasons for recurrence

## 2023-02-05 NOTE — Telephone Encounter (Signed)
 Routing to [facility] clinical support and front desk pool to schedule pt for UDS with Dr. Hyacinth Meeker.    Mervyn Gay RN

## 2023-02-05 NOTE — Telephone Encounter (Signed)
 Given 5 rounds of Macrobid since 10/31/22 telemedicine visit w Dr. Hyacinth Meeker, please schedule patient for UDS as noted in Dr. Rondel Baton plan.  Thanks.

## 2023-02-14 ENCOUNTER — Other Ambulatory Visit (INDEPENDENT_AMBULATORY_CARE_PROVIDER_SITE_OTHER): Payer: Self-pay | Admitting: Family Medicine

## 2023-02-14 DIAGNOSIS — S335XXA Sprain of ligaments of lumbar spine, initial encounter: Secondary | ICD-10-CM

## 2023-02-14 MED ORDER — CYCLOBENZAPRINE HCL 5 MG OR TABS
5.0000 mg | ORAL_TABLET | Freq: Three times a day (TID) | ORAL | 2 refills | Status: DC | PRN
Start: 2023-02-14 — End: 2023-09-10

## 2023-02-14 NOTE — Telephone Encounter (Signed)
This medication is outside of the Refill Center's protocols.  If this medication is denied please have your staff inform the patient and schedule an appointment if necessary.

## 2023-02-19 ENCOUNTER — Ambulatory Visit (INDEPENDENT_AMBULATORY_CARE_PROVIDER_SITE_OTHER): Payer: PPO | Admitting: Physician Assistant

## 2023-02-19 ENCOUNTER — Encounter (INDEPENDENT_AMBULATORY_CARE_PROVIDER_SITE_OTHER): Payer: Self-pay | Admitting: Physician Assistant

## 2023-02-19 ENCOUNTER — Inpatient Hospital Stay (INDEPENDENT_AMBULATORY_CARE_PROVIDER_SITE_OTHER): Admit: 2023-02-19 | Discharge: 2023-02-19 | Disposition: A | Discharge status: Home / Self Care | Payer: Self-pay

## 2023-02-19 VITALS — BP 135/84 | HR 72 | Temp 98.5°F | Resp 16 | Wt 153.0 lb

## 2023-02-19 DIAGNOSIS — M25561 Pain in right knee: Secondary | ICD-10-CM

## 2023-02-19 NOTE — Patient Instructions (Signed)
Possible meniscal injury  For next few weeks, avoid running and stairs. Bike is usually okay-- but test out in small increments  Wear hinged brace with prolonged standing or walking     Ice knee for 15 minutes several times daily for a few more days  Ibuprofen or Tylenol as needed       Try out the rehab excercises in handout    Follow up with Sports Medicine in a few weeks to monitor recovery

## 2023-02-19 NOTE — Progress Notes (Signed)
Historian: PATIENT VS PARENT: patient  Interpreter used: N/A    Chief Complaint   Patient presents with    Knee Pain     Pt is here with right knee pain. She states that she hurt it a few weeks ago in the gym. She felt like it got better but went back to the gym on Friday and has been having some severe pain in it since then again.        Subjective:   Cassie Contreras is a 73 year old female who presents on 02/19/2023 for the following concerns:    Knee pain: initially started to hurt a few weeks ago as she was doing a work out with squats and lifting. No specific event/injury, pain developed a few hours later after work out. Took some time to rest and was feeling all or nearly improved, then went back to do workout with trainer last week, and pain was severe by that night.  Painful to bend knee and bear weight  Treatments: ice, ibuprofen, elevation -- has been doing this all weekend and now pain is less severe. Also tried a compression sleeve but became worried it was worsening swelling today.    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    Review of Systems       Objective:   Vitals: BP 135/84   Pulse 72   Temp 36.9 C (Temporal)   Resp 16   Wt 69.4 kg (153 lb)   SpO2 99%   BMI 22.59 kg/m   Physical Exam  Vitals and nursing note reviewed.   Constitutional:       General: She is not in acute distress.     Appearance: She is well-developed.   Pulmonary:      Effort: Pulmonary effort is normal.   Musculoskeletal:        Legs:       Comments: Knee exam (focus on Right knee):  -- Inspection: Trace effusion notable. There is no bruising, erythema, or atrophy noted when compared to unaffected knee. There is a tiny fading bruise lower down on medial shin- unclear if connected to symptoms  -- Gait:  antalgic: Antalgic  gait with Full weight bearing  -- Supine ROM: ROM from 0  (-10 to +90) degrees extension and 100 (0--130 flexion)  -- Palpation: Patellar facets are not tender. Palpation along the patellar tendon reveals no tenderness, no defect. There is moderate joint line tenderness (medial.)    Special tests:  - McMurray test: Produces clunk, Reproduces pain, Medially  - Varus and valgus stress testing at 0 and 30 degrees shows no laxity.   - Lever sign test: Patient supine with knee extended. Place one fist under proximal calf, then apply downward force over quadriceps: Negative (heel lifts off table)  - Lachman's maneuver: No laxity  - Pivot shift test: No laxity  - Posterior drawer test: No laxity           Skin:     General: Skin is warm and dry.   Neurological:      Mental Status: She is alert.   Psychiatric:         Behavior: Behavior normal.        Knee XR (my review): some  DJD present, no effusion or fracture     Assessment and Plan:   Right medial knee pain  Most suspect meniscal injury  Recommend hinged knee brace, ice, rest from running or stairs  Gave handout with rehab exercises from [facility] Patient Advisor book  Follow up with PCP or Sports Med in coming weeks to monitor, consider advanced imaging if not improving  See AVS for detailed patient instructions.     - XR Knee 3 View Right; Future  - Referral to Sports and Spine; Future  - [employer]    Elsie Lincoln, PA-C  [facility] AT [place]  [id number] [place] [address]  [phone]

## 2023-02-20 NOTE — Result Encounter Note (Signed)
Discussed in UC

## 2023-02-23 ENCOUNTER — Telehealth (INDEPENDENT_AMBULATORY_CARE_PROVIDER_SITE_OTHER): Payer: Self-pay | Admitting: Family Medicine

## 2023-02-23 DIAGNOSIS — M23303 Other meniscus derangements, unspecified medial meniscus, right knee: Secondary | ICD-10-CM

## 2023-02-23 DIAGNOSIS — M25561 Pain in right knee: Secondary | ICD-10-CM

## 2023-02-23 NOTE — Telephone Encounter (Signed)
Contacted by patient's husband indicating the right knee continues to be painful.  Reviewed [facility] note and xray.  Given persistence, MRI ordered.    Routing back to myself for tracking report

## 2023-03-02 ENCOUNTER — Ambulatory Visit
Admission: RE | Admit: 2023-03-02 | Discharge: 2023-03-02 | Disposition: A | Discharge status: Home / Self Care | Payer: PPO | Attending: Diagnostic Radiology | Admitting: Diagnostic Radiology

## 2023-03-02 DIAGNOSIS — M25561 Pain in right knee: Secondary | ICD-10-CM | POA: Insufficient documentation

## 2023-03-02 DIAGNOSIS — M23303 Other meniscus derangements, unspecified medial meniscus, right knee: Secondary | ICD-10-CM | POA: Insufficient documentation

## 2023-03-06 ENCOUNTER — Encounter (HOSPITAL_BASED_OUTPATIENT_CLINIC_OR_DEPARTMENT_OTHER): Payer: Self-pay

## 2023-03-06 ENCOUNTER — Ambulatory Visit: Payer: PPO | Attending: Orthopaedic Surgery | Admitting: Orthopaedic Surgery

## 2023-03-06 VITALS — BP 153/83 | HR 64 | Temp 98.4°F | Ht 68.5 in | Wt 153.0 lb

## 2023-03-06 DIAGNOSIS — S83271A Complex tear of lateral meniscus, current injury, right knee, initial encounter: Secondary | ICD-10-CM | POA: Insufficient documentation

## 2023-03-06 DIAGNOSIS — M25561 Pain in right knee: Secondary | ICD-10-CM | POA: Insufficient documentation

## 2023-03-06 DIAGNOSIS — S83231A Complex tear of medial meniscus, current injury, right knee, initial encounter: Secondary | ICD-10-CM | POA: Insufficient documentation

## 2023-03-06 NOTE — Patient Instructions (Addendum)
You were seen today for right knee pain and a medial meniscus tear. We dicussed that this is degenerative in nature, or wear and tear. Let's focus on continuing your activity over the next month and then checking in over myChart in one month to see how the knee feels. You can work with your personal therapist or we have provided a referral to physical therapy that you can take to any physical therapist in the [facility] area that takes your insurance. Avoid activities that cause pain in the right knee, this is likely deep flexion of the knee.

## 2023-03-06 NOTE — Progress Notes (Signed)
[facility] CONSULTATION    Requested By  Clemmie Krill, MD    Consult Question  Right Knee Pain    Attending of Record  Juanito Doom, MD    Identification and Chief Complaint  Cassie Contreras is a 73 year old woman who presents with a chief complaint of right knee pain.    History of Present Illness  Knee pain started approximately one month ago though she has had some pain off and on over the past few years.  Pain is located over the medial greater than lateral joint line.  It is worse with activity, specifically deep squatting activities and lower extremity exercising.  Also associated with some popping and snapping.  Has trialed acetaminophen and bracing.  Cannot take NSAIDs as these make her swell.  No physical therapy but has been working with her personal trainer two days a week.   Was seen in urgent care where x-rays were obtained and were negative for fracture or significant arthritis.  Subsequent MRI revealed a medial meniscal complex tear for which she was referred to orthopedic surgery.    Mobility Status: Community ambulator without assistive devices  Accompanied By: Self  Prior Difficulties with Anesthesia: Denies    Review of Systems  A complete 12 point review of systems was performed and negative except as outlined in the history of present illness above.    Medications  Clindamycin Topical Gel  Clonazepam PRN  Cyclobenzaprine  Estradiol  Memantine  Omeprazole  Valacyclovir    Allergies  Erythromycin  Ibuprofen  Pneumococcal Polysaccharide Vaccine  Sulfa Antibiotics    Past Medical History  Anxiety  Depression  GERD  Thyroid Disease    Past Surgical History  Tonsillectomy  Right Thumb UCL Repair  Left Ankle Surgery for OCD Lesions of [place]us    Social History  Tobacco: Denies  Alcohol: Denies  Employment: Retired  Housing: Lives in [place], [place]  Phone Number: [phone]     Family History  Denies family history of bone, bleeding, or clotting  disorders.  Denies family history of difficulties with anesthesia.    Physical Exam  Vitals: BP 153/83 HR 64 T 36.9C SpO2 99%  General: Well nourished, well developed. In no acute distress. Appears stated age. Sitting upright on examination table.  Respiratory: Breathing comfortably on room air. Bilateral symmetric chest rise. No extra work of breathing.   Neuro: Alert. Responding to questions appropriately.    Right Lower Extremity   Inspection: No gross deformity.  No open wounds. No effusion.  Palpation: Tender to palpation over posteromedial joint line.  Range of Motion (Nested ROM): 0 - 130.  Sensory: Sensation intact to light touch deep peroneal, superficial peroneal, tibial, sural, and saphenous distributions.  Motor: Fires tibialis anterior, gastrocnemius - soleus complex, flexor hallucis longus, and extensor hallucis longus 5/5.  Vascular: Warm and well perfused and 2+ DP pulse.  Special Test: + McMurrays. No laxity to varus / valgus stress. - Lachman.    Labs  None    Imaging  Radiographs of the right knee dated 02/19/2023 independently reviewed and negative for fracture or dislocation.  There is no medial or lateral joint space narrowing.  There is no patellofemoral joint space narrowing.    MRI of the right knee without contrast dated 03/02/2023 independently reviewed and reveals a complex tear of the posterior horn of the right medial meniscus.  There is increased signal within the lateral meniscus posterior horn that extends  to the junction, which could represent a tear.  There is chondromalacia of the lateral patellar facet with a small subchondral cyst.    Assessment and Plan  Cassie Contreras is a 73 year old woman who presents with a symptomatic right knee medial meniscus tear.  We discussed that based on MRI findings this represents a degenerative type tear in the medial meniscus that likely occurred over time and has just become symptomatic due to irritation from increasing her activity.  We briefly  mentioned surgical options to include debridement of the meniscus but that we would be unable to repair it.  For this reason, rehabilitation is focused on relieving symptoms in the right knee and is quite possible that the inflammation can be calmed down with nonoperative management.  We will have her work with her personal trainer on strengthening to the lower extremities and avoiding deep flexion or squats as this is likely contributing to her symptoms.  Should she desire, we have also provided her with an external physical therapy referral to work with physical therapy on targeted strengthening of the lower extremity.  We will have her check-in in 6 to 8 weeks over MyChart.  If she continues to have persistent symptoms or worsening of her symptoms we will have her follow-up for a repeat visit.    All questions were answered and the patient was in agreement with the plan of care as stated.    The patient was seen and evaluated with Dr. Diona Browner, attending of record, who was in agreement.    Ernesta Amble, MD  Resident Physician  [facility]  Department of Orthopaedic Surgery and Sports Medicine

## 2023-03-09 NOTE — Progress Notes (Signed)
Patient seen and examined with the resident. Agree with assessment and plan.    Cassie Contreras is a 73 year old woman with right knee pain from likely a combination of some early OA and medial meniscus degenerative tearing.    We will see if we can manage this without an operation as it seems like she is gradually starting to feel better.  She will do some physical therapy rehab with her physical athletic trainer and if she needs she can work with the PT as well a referral was given for her.  If she is not better in the next month or 2 will plan to do a injection and if that does not do the trick we may need to resort to an operation but I would like to see if we can avoid that if possible.    Loreli Slot, MD  Associate Professor  Chief, Sports Medicine Surgery  Department of Orthopaedics and Sports Medicine  [facility]

## 2023-03-14 ENCOUNTER — Other Ambulatory Visit (HOSPITAL_BASED_OUTPATIENT_CLINIC_OR_DEPARTMENT_OTHER): Payer: Self-pay | Admitting: Family Medicine

## 2023-03-14 DIAGNOSIS — Z1231 Encounter for screening mammogram for malignant neoplasm of breast: Secondary | ICD-10-CM

## 2023-03-16 ENCOUNTER — Encounter (HOSPITAL_BASED_OUTPATIENT_CLINIC_OR_DEPARTMENT_OTHER): Payer: Self-pay | Admitting: Urology

## 2023-03-19 NOTE — Telephone Encounter (Signed)
 Pt reports light brown spotting 3 times a week. She will discuss at next office visit.

## 2023-03-21 ENCOUNTER — Encounter (HOSPITAL_BASED_OUTPATIENT_CLINIC_OR_DEPARTMENT_OTHER): Payer: Self-pay

## 2023-03-21 ENCOUNTER — Ambulatory Visit
Admission: RE | Admit: 2023-03-21 | Discharge: 2023-03-21 | Disposition: A | Discharge status: Home / Self Care | Payer: PPO | Attending: Body Imaging | Admitting: Body Imaging

## 2023-03-21 DIAGNOSIS — Z1231 Encounter for screening mammogram for malignant neoplasm of breast: Secondary | ICD-10-CM | POA: Insufficient documentation

## 2023-04-02 ENCOUNTER — Telehealth (HOSPITAL_BASED_OUTPATIENT_CLINIC_OR_DEPARTMENT_OTHER): Payer: Self-pay

## 2023-04-02 NOTE — Telephone Encounter (Signed)
 Urod instructions sent.

## 2023-04-09 NOTE — Progress Notes (Signed)
 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    PROCEDURE:  Complex cystometrogram with voiding pressure studies, Complex uroflowmetry, Cystography, Injection procedure for cystography, and dip UA      ANESTHESIA: None.    COMPLICATIONS: none    We discussed the risks and benefits of procedure including the risks of hematuria and infection.  Pressure flow urodynamics were therefore performed.    OPERATIVE SUMMARY:  A noninvasive uroflow study was performed. She voided 277cc with ave flow rate of 8.2cc/sec with max flow rate of 14 cc/sec, continuous but flattened flow with PVR of 40cc.The patient was prepped and draped and  was then catheterized with a 7-French double-lumen urodynamic catheter and a rectal catheter was placed.    FILLING CYSTOMETRY   Contrast was infused at 30-50cc per minute.  Pressure flow urodynamics was performed with fluoro.     With the patient in the seated position, contrast was infused at 50cc/min.The patient's first sensation was at 247 ml. She had no leak with cough up to 100cm h20 at 347cc.  The patient had strong desire was at 508 ml at which point we stopped filling.     She had no leak with cough up to 100cm h20 at 347cc and 508cc in the seated position. We moved her to the standing position when she couldn't void in the sitting position. She did demonstrate leakage with VLPP of 60-90 cm h20 h20 in the standing position.      During filling there were no uninhibited bladder contractions.    During the exam the compliance was normal.     She did void in the standing position with voluntary detrusor contraction of approx 5cm h20.    On fluoroscopy the bladder contour was smooth, there was no ureteral reflux. Her bladder base was located at the  inferior margin of the symphysis-no cystocele. Her urethral access was approx 15-20 degrees and moved little with valsalva.    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 She voided to completion in the noninvasive setting.

## 2023-04-10 ENCOUNTER — Ambulatory Visit: Payer: PPO | Attending: Urology | Admitting: Urology

## 2023-04-10 VITALS — BP 143/66 | HR 72 | Temp 97.9°F

## 2023-04-10 DIAGNOSIS — N390 Urinary tract infection, site not specified: Secondary | ICD-10-CM | POA: Insufficient documentation

## 2023-04-10 DIAGNOSIS — R69 Illness, unspecified: Secondary | ICD-10-CM

## 2023-04-10 LAB — U/A NONAUTO DIPSTICK ONLY, ONSITE
Bilirubin (Indirect): NEGATIVE
Glucose, Urine: NEGATIVE mg/dL
Ketones, URN: 5 mg/dL
Leukocytes: NEGATIVE
Nitrite, URN: NEGATIVE
Occult Blood, URN: NEGATIVE
Protein: NEGATIVE mg/dL
Urobilinogen, URN: 0.2 U/dL (ref 0.2–1.0)
pH, URN (UWNC): 6 (ref 5.0–8.0)

## 2023-04-10 MED ORDER — IBUPROFEN 200 MG OR TABS
ORAL_TABLET | ORAL | Status: AC
Start: 2023-04-10 — End: 2023-04-10
  Filled 2023-04-10: qty 1

## 2023-04-10 MED ORDER — DIATRIZOATE MEGLUMINE 30 % UR SOLN
600.0000 mL | Freq: Once | URETHRAL | Status: AC
Start: 2023-04-10 — End: 2023-04-10
  Administered 2023-04-10: 600 mL via URETHRAL

## 2023-04-11 ENCOUNTER — Ambulatory Visit: Payer: PPO | Admitting: Physical Medicine & Rehabilitation

## 2023-04-12 ENCOUNTER — Other Ambulatory Visit (INDEPENDENT_AMBULATORY_CARE_PROVIDER_SITE_OTHER): Payer: Self-pay | Admitting: Family Medicine

## 2023-04-12 ENCOUNTER — Encounter (INDEPENDENT_AMBULATORY_CARE_PROVIDER_SITE_OTHER): Payer: Self-pay | Admitting: Family Medicine

## 2023-04-12 DIAGNOSIS — M25552 Pain in left hip: Secondary | ICD-10-CM

## 2023-04-12 DIAGNOSIS — L719 Rosacea, unspecified: Secondary | ICD-10-CM

## 2023-04-12 DIAGNOSIS — L700 Acne vulgaris: Secondary | ICD-10-CM

## 2023-04-12 NOTE — Telephone Encounter (Signed)
 Patient requests refills for Tretinoin and Tramadol (not on current med list). Please advise    Pharmacy pended    Last visit 02/05/2023

## 2023-04-13 MED ORDER — CLINDAMYCIN PHOS (TWICE-DAILY) 1 % EX GEL
Freq: Two times a day (BID) | CUTANEOUS | 1 refills | Status: AC
Start: 2023-04-13 — End: ?

## 2023-04-13 MED ORDER — TRETINOIN 0.05 % EX CREA
TOPICAL_CREAM | Freq: Every evening | CUTANEOUS | 1 refills | Status: AC
Start: 2023-04-13 — End: ?

## 2023-04-13 NOTE — Telephone Encounter (Signed)
 Pt needs pain mgt visit with PCP. Can you offer her options?

## 2023-04-14 ENCOUNTER — Telehealth (INDEPENDENT_AMBULATORY_CARE_PROVIDER_SITE_OTHER): Payer: Self-pay

## 2023-04-14 NOTE — Telephone Encounter (Signed)
 Please see PA started below.

## 2023-04-16 ENCOUNTER — Encounter (HOSPITAL_BASED_OUTPATIENT_CLINIC_OR_DEPARTMENT_OTHER): Payer: Self-pay | Admitting: Urology

## 2023-04-17 ENCOUNTER — Other Ambulatory Visit (HOSPITAL_BASED_OUTPATIENT_CLINIC_OR_DEPARTMENT_OTHER): Payer: Self-pay | Admitting: Urology

## 2023-04-17 ENCOUNTER — Encounter (HOSPITAL_BASED_OUTPATIENT_CLINIC_OR_DEPARTMENT_OTHER): Payer: Self-pay

## 2023-04-17 DIAGNOSIS — N939 Abnormal uterine and vaginal bleeding, unspecified: Secondary | ICD-10-CM

## 2023-04-19 NOTE — Telephone Encounter (Signed)
 Spoke to patient and scheduled THV on 04/30/23. Closing TE

## 2023-04-23 MED ORDER — TRAMADOL HCL 50 MG OR TABS
50.0000 mg | ORAL_TABLET | ORAL | 0 refills | Status: DC | PRN
Start: 2023-04-23 — End: 2023-09-10

## 2023-04-23 NOTE — Telephone Encounter (Signed)
 Tramadol refilled and warned re risks w clonazepam and advised to never take within 24 hours of each other.

## 2023-04-23 NOTE — Addendum Note (Signed)
 Addended by: [name] on: 04/23/2023 05:15 PM     Modules accepted: Orders

## 2023-04-30 ENCOUNTER — Telehealth (INDEPENDENT_AMBULATORY_CARE_PROVIDER_SITE_OTHER): Admitting: Family Medicine

## 2023-05-12 ENCOUNTER — Other Ambulatory Visit (HOSPITAL_BASED_OUTPATIENT_CLINIC_OR_DEPARTMENT_OTHER): Payer: Self-pay | Admitting: Neurology

## 2023-05-12 DIAGNOSIS — R519 Headache, unspecified: Secondary | ICD-10-CM

## 2023-05-15 MED ORDER — MEMANTINE HCL 10 MG OR TABS
10.0000 mg | ORAL_TABLET | Freq: Every day | ORAL | 0 refills | Status: DC
Start: 2023-05-15 — End: 2023-05-23

## 2023-05-15 NOTE — Telephone Encounter (Signed)
 Patient is due for yearly exam.  One refill authorized.  Please schedule follow up visit.    Last visit 03/29/22

## 2023-05-20 ENCOUNTER — Telehealth (INDEPENDENT_AMBULATORY_CARE_PROVIDER_SITE_OTHER): Payer: Self-pay | Admitting: Family Medicine

## 2023-05-20 ENCOUNTER — Encounter (INDEPENDENT_AMBULATORY_CARE_PROVIDER_SITE_OTHER): Payer: Self-pay | Admitting: Family Medicine

## 2023-05-20 ENCOUNTER — Other Ambulatory Visit (INDEPENDENT_AMBULATORY_CARE_PROVIDER_SITE_OTHER)

## 2023-05-20 DIAGNOSIS — R399 Unspecified symptoms and signs involving the genitourinary system: Secondary | ICD-10-CM

## 2023-05-20 DIAGNOSIS — N390 Urinary tract infection, site not specified: Secondary | ICD-10-CM

## 2023-05-20 LAB — URINALYSIS WITH REFLEX CULTURE

## 2023-05-20 LAB — U/A AUTO DIPSTICK ONLY, ONSITE
Bilirubin, Urine: NEGATIVE
Glucose, Urine: NEGATIVE mg/dL
Ketones, URN: NEGATIVE mg/dL
Nitrite, URN: NEGATIVE
Occult Blood, URN: NEGATIVE
Protein: NEGATIVE mg/dL
Specific Gravity, Urine: 1.01 (ref 1.005–1.030)
Urobilinogen, URN: 0.2 U/dL (ref 0.2–1.0)
pH, URN: 6 (ref 5.0–8.0)

## 2023-05-20 MED ORDER — NITROFURANTOIN MONOHYD MACRO 100 MG OR CAPS
100.0000 mg | ORAL_CAPSULE | Freq: Two times a day (BID) | ORAL | 0 refills | Status: DC
Start: 2023-05-20 — End: 2023-07-03

## 2023-05-20 NOTE — Telephone Encounter (Signed)
 Patient dropped off urine sample this morning but looks like initial order is for testing at [facility]. Will call [facility] at 1 when lab staff return

## 2023-05-21 LAB — URINE C/S: Culture: >100000 — AB

## 2023-05-22 ENCOUNTER — Other Ambulatory Visit (HOSPITAL_BASED_OUTPATIENT_CLINIC_OR_DEPARTMENT_OTHER): Payer: Self-pay

## 2023-05-22 ENCOUNTER — Encounter (INDEPENDENT_AMBULATORY_CARE_PROVIDER_SITE_OTHER): Payer: Self-pay | Admitting: Family Medicine

## 2023-05-22 ENCOUNTER — Encounter (HOSPITAL_BASED_OUTPATIENT_CLINIC_OR_DEPARTMENT_OTHER): Payer: Self-pay

## 2023-05-22 DIAGNOSIS — R519 Headache, unspecified: Secondary | ICD-10-CM

## 2023-05-22 NOTE — Telephone Encounter (Signed)
 Prior Authorization Approval     Approved dates:   02/21/2023 - 05/21/2024  Approval #:  [id number]     Medication:   Tretinoin  0.05% Cream  Quantity/Day supply:   20 / 30  Prescription Insurance:   CAMB [id number] 002 MAPD INDIV (PRIME RBS [place] )  PAC Workflow Exemption Request:   None  Pharmacy:   [facility] [phone]  [address] [place]  [place] [address]  Phone: [phone]  Fax: [phone]  notified via Fax    Additional Information:      Thank You,  [place] Gails CPhT  [facility] Prior Authorization  P: [phone]  F: [phone]

## 2023-05-22 NOTE — Telephone Encounter (Signed)
 Prior Authorization Pending    Medication:   Tretinoin  0.05% Cream  Quantity/Day supply:   20 / 30  Prescription Insurance:   CAMB [id number] 002 MAPD INDIV (PRIME RBS [place] )  PAC Workflow Exemption Request:   None  Pharmacy:   [facility] [phone]  [address], [place]  [place] [address]  Phone: [phone]  Fax: [phone]  Date Submitted:   05/22/2023   Type of Request:  [x]  Cover My Meds Key via Latent: [id number]    After The Prior Authorization Center has changed the status of the request to pending, please allow a minimum of 3 to 5 business days for the insurance to respond. (some insurances may take longer).

## 2023-05-22 NOTE — Telephone Encounter (Signed)
 Sent mychart message with annual follow up reminder

## 2023-05-22 NOTE — Telephone Encounter (Signed)
 Routing to provider for review and advisement.

## 2023-05-23 MED ORDER — MEMANTINE HCL 10 MG OR TABS
10.0000 mg | ORAL_TABLET | Freq: Every day | ORAL | 3 refills | Status: DC
Start: 2023-05-23 — End: 2023-12-05

## 2023-06-07 ENCOUNTER — Telehealth (INDEPENDENT_AMBULATORY_CARE_PROVIDER_SITE_OTHER): Payer: Self-pay | Admitting: Family Medicine

## 2023-06-07 DIAGNOSIS — N390 Urinary tract infection, site not specified: Secondary | ICD-10-CM

## 2023-06-07 NOTE — Telephone Encounter (Signed)
 Injured RIGHT foot when their dog ran into it yesterday.  Icing helps.  Adv to monitor and let me know if not improving or worsening    Will trial sending paper external UA/Cx orders to patient to bring into [facility] PRN UTI symptoms and have her alert me whenever she does this so I can look into Care Everywhere for reports.

## 2023-06-13 ENCOUNTER — Other Ambulatory Visit (INDEPENDENT_AMBULATORY_CARE_PROVIDER_SITE_OTHER): Payer: Self-pay | Admitting: Family Medicine

## 2023-06-13 DIAGNOSIS — N390 Urinary tract infection, site not specified: Secondary | ICD-10-CM

## 2023-07-02 ENCOUNTER — Other Ambulatory Visit: Payer: Self-pay

## 2023-07-03 ENCOUNTER — Telehealth (INDEPENDENT_AMBULATORY_CARE_PROVIDER_SITE_OTHER): Payer: Self-pay | Admitting: Family Medicine

## 2023-07-03 DIAGNOSIS — N390 Urinary tract infection, site not specified: Secondary | ICD-10-CM

## 2023-07-03 MED ORDER — NITROFURANTOIN MONOHYD MACRO 100 MG OR CAPS
100.0000 mg | ORAL_CAPSULE | Freq: Two times a day (BID) | ORAL | 0 refills | Status: AC
Start: 2023-07-03 — End: 2023-07-10

## 2023-07-03 NOTE — Telephone Encounter (Signed)
 Patient reporting recurrence of UTI symptoms today, dropped off sample at [facility] location on [address] in [place] : [phone]

## 2023-07-03 NOTE — Telephone Encounter (Signed)
 CAlled lab but no result expected until tonight or tomorrow.  Abx as before sent in preemptively given symptoms.  Has urology appt next week

## 2023-07-04 NOTE — Telephone Encounter (Signed)
 S/w patient, feeling better but not 100%.  Unfortunately [place]  [facility] is closed so will attempt tomorrow for lab reports

## 2023-07-04 NOTE — Telephone Encounter (Signed)
 Forms has been faxed and confirmed to [facility]. Fax number to [phone]. Mail forms to patient home address.

## 2023-07-10 ENCOUNTER — Ambulatory Visit: Admitting: Urology

## 2023-07-10 DIAGNOSIS — N393 Stress incontinence (female) (male): Secondary | ICD-10-CM | POA: Insufficient documentation

## 2023-07-10 DIAGNOSIS — N390 Urinary tract infection, site not specified: Secondary | ICD-10-CM

## 2023-07-10 MED ORDER — NITROFURANTOIN MACROCRYSTAL 100 MG OR CAPS
100.0000 mg | ORAL_CAPSULE | Freq: Every day | ORAL | 1 refills | Status: DC
Start: 2023-07-10 — End: 2023-10-31

## 2023-07-10 NOTE — Progress Notes (Signed)
 Distant Site Telemedicine Encounter  I conducted this encounter via secure, live, face-to-face video conference with the patient. I reviewed the risks and benefits of telemedicine as pertinent to this visit and the patient agreed to proceed.    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  After consideration of her options she would like to proceed with Bulkamid injections some time in the fall.    She has long h/o frequent utis and is just completing course of antibiotics (macrobid ). She is on vaginal estrogen and would like to consider daily prophylaxis trial. We had discussed this in the past and she had declined trial of methenamine or antibiotic. Reports easily more than 4 utis/year.             Past Medical Hx:    Past Medical History:   Diagnosis Date    Abdominal pain     Anxiety     Arthritis     Asthma (HCC)     Constipation     Depression     GERD (gastroesophageal reflux disease)     Headache     Heart palpitations     History of irritable bowel syndrome     Hormone disorder     Metatarsal stress fracture of right foot     TX FROM PAPER CHART      Migraine     Osteoporosis     Primary insomnia     Squamous cell carcinoma in situ of skin     Thyroid  disease     UTI (urinary tract infection)        Past Surgical Hx:    Past Surgical History:   Procedure Laterality Date    PR UNLISTED PROCEDURE FOOT/TOES      PR UNLISTED PROCEDURE HANDS/FINGERS      PR UNLISTED PROCEDURE  LEG/ANKLE      Tonsilectomy      Childhood    URETHRAL DILATION/DVIU         Active Meds:    Outpatient Medications Marked as Taking for the 07/10/23 encounter (Telemedicine) with Cleotilde Slater Kay, MD   Medication Sig Dispense Refill    clindamycin  1 % gel Apply topically 2 times a day. Apply to face. 30 g 1    clonazePAM  0.5 MG tablet Take 1 tablet (0.5 mg) by mouth daily as needed (anxiety). 20 tablet 0    cyclobenzaprine  5 MG tablet Take 1 tablet (5 mg) by mouth 3 times a day as needed for muscle spasms (5 to 10 mg at onset of muscle spasm as needed). 90 tablet 2    estradiol  10 MCG vaginal tablet Place 1 tablet (10 mcg) into the vagina 3 times a week. 36 tablet 3    memantine  10 MG tablet Take 1 tablet (10 mg) by mouth daily. 90 tablet 3    nitrofurantoin  SR monohydrate/macrocrystals 100 MG capsule Take 1 capsule (100 mg) by mouth 2 times a day for 7 days. 14 capsule 0    omeprazole   20 MG DR capsule Take 1 capsule (20 mg) by mouth daily on an empty stomach. 90 capsule 3    traMADol  50 MG tablet Take 1 tablet (50 mg) by mouth every 4 hours as needed for moderate pain or severe pain. 30 tablet 0    tretinoin  0.05 % cream Apply topically at bedtime. Apply to affected areas on face. 20 g 1    valACYclovir  500 MG tablet Take 2 tablets (1,000 mg) by mouth 2 times a day. For 5 days. May repeat if needed. 60 tablet 3       Allergies:    Allergies as of 07/10/2023 - Reviewed 07/10/2023   Allergen Reaction Noted    Erythromycin Other 07/22/2012    Ibuprofen   07/19/2012    Pneumovax [pneumococcal polysaccharide vaccine]  08/14/2017    Sulfa antibiotics  07/19/2012       OBJECTIVE:  Physical Exam:  There were no vitals taken for this visit.  Constitutional: NAD      ASSESSMENT/PLAN:   SUI- to OR for Bulkamid injections-reviewed management options today including observation and risks of desired procedure to include infection, bleeding, pain, urinary retention, failure to control incontinence, need for second set of  injections. Patient states she understands and wishes to proceed.  Frequent utis-no correctable risk factor identified. Wants to pursue trial of daily macrodantin  suppression. Long term risks discussed. States she understands and wishes to proceed. Rx for macrodantin  100mg  daily for 3 months. Will place order.    I spent a total of 29 minutes for the patient's care on the date of the service.

## 2023-07-11 ENCOUNTER — Encounter (HOSPITAL_BASED_OUTPATIENT_CLINIC_OR_DEPARTMENT_OTHER): Payer: Self-pay | Admitting: Urology

## 2023-07-11 ENCOUNTER — Telehealth (HOSPITAL_BASED_OUTPATIENT_CLINIC_OR_DEPARTMENT_OTHER): Payer: Self-pay | Admitting: Urology

## 2023-07-11 DIAGNOSIS — R32 Unspecified urinary incontinence: Secondary | ICD-10-CM

## 2023-07-11 NOTE — Telephone Encounter (Signed)
 Outbound call to patient to schedule surgery with Dr Cleotilde. Patient is scheduled for 09/27/23. She would like orders for urine test sent to [facility] in [place] .     Please contact patient for RN teach as needed    Thank you!

## 2023-07-13 ENCOUNTER — Other Ambulatory Visit (INDEPENDENT_AMBULATORY_CARE_PROVIDER_SITE_OTHER): Payer: Self-pay

## 2023-07-15 ENCOUNTER — Other Ambulatory Visit (INDEPENDENT_AMBULATORY_CARE_PROVIDER_SITE_OTHER): Payer: Self-pay | Admitting: Family Medicine

## 2023-07-15 DIAGNOSIS — N390 Urinary tract infection, site not specified: Secondary | ICD-10-CM

## 2023-07-18 MED ORDER — ESTRADIOL 10 MCG VA TABS
ORAL_TABLET | VAGINAL | 0 refills | Status: DC
Start: 2023-07-18 — End: 2023-10-08

## 2023-07-18 NOTE — Telephone Encounter (Signed)
 Patient is due for yearly WELLNESS exam.  One refill authorized.  Please schedule follow up visit.

## 2023-07-18 NOTE — Telephone Encounter (Signed)
 Patient notified via MyChart.    Closing TE.

## 2023-07-19 ENCOUNTER — Encounter (INDEPENDENT_AMBULATORY_CARE_PROVIDER_SITE_OTHER): Payer: Self-pay | Admitting: Family Medicine

## 2023-07-25 ENCOUNTER — Other Ambulatory Visit (INDEPENDENT_AMBULATORY_CARE_PROVIDER_SITE_OTHER): Payer: Self-pay | Admitting: Family Medicine

## 2023-07-25 DIAGNOSIS — K219 Gastro-esophageal reflux disease without esophagitis: Secondary | ICD-10-CM

## 2023-07-29 MED ORDER — OMEPRAZOLE 20 MG OR CPDR
20.0000 mg | DELAYED_RELEASE_CAPSULE | Freq: Every day | ORAL | 0 refills | Status: DC
Start: 2023-07-29 — End: 2023-10-08

## 2023-07-29 NOTE — Telephone Encounter (Signed)
 Patient is due for yearly exam.  One refill authorized.  Please schedule follow up visit.

## 2023-07-30 ENCOUNTER — Telehealth (INDEPENDENT_AMBULATORY_CARE_PROVIDER_SITE_OTHER): Admitting: Family Medicine

## 2023-07-30 NOTE — Telephone Encounter (Signed)
 Patient notified via MyChart.    Closing TE.

## 2023-07-31 ENCOUNTER — Encounter (HOSPITAL_BASED_OUTPATIENT_CLINIC_OR_DEPARTMENT_OTHER): Payer: Self-pay | Admitting: Urology

## 2023-07-31 DIAGNOSIS — B379 Candidiasis, unspecified: Secondary | ICD-10-CM

## 2023-08-02 MED ORDER — FLUCONAZOLE 150 MG OR TABS
150.0000 mg | ORAL_TABLET | Freq: Once | ORAL | 3 refills | Status: AC
Start: 2023-08-02 — End: 2023-08-02

## 2023-09-10 ENCOUNTER — Telehealth (INDEPENDENT_AMBULATORY_CARE_PROVIDER_SITE_OTHER): Payer: Self-pay | Admitting: Family Medicine

## 2023-09-10 ENCOUNTER — Encounter (HOSPITAL_BASED_OUTPATIENT_CLINIC_OR_DEPARTMENT_OTHER): Payer: Self-pay | Admitting: Family Medicine

## 2023-09-10 DIAGNOSIS — M810 Age-related osteoporosis without current pathological fracture: Secondary | ICD-10-CM

## 2023-09-10 NOTE — Telephone Encounter (Signed)
 Called re ZA infusion due in late Sept. She will get labs at 9/4 procedure.    She also relates that last week she got up and felt dizzy and thought maybe she was going to black out so laid back down.  She again felt like the room was spinning later that day and again the day after.  I explained the difference betw vertigo and light-headedness and adv her to elucidate at her appointment w another physician later today.

## 2023-09-11 NOTE — Telephone Encounter (Signed)
 Pre op urine order placed for collection prior to 8/25 in anticipation of bladder botox injection 09/27/23 in OR.

## 2023-09-12 NOTE — Progress Notes (Signed)
 ZIO PATCH APPLICATION    Patient consent and authorization signed? Yes    Zio patch #[id number] applied today according to manufacturer instructions.  Patient to wear for Ddays and then return in prepaid box to [employer].    Patient instructed to:   -Call [employer] for any support needed regarding device [phone]   -Avoid showering during the first 24 hours of wearing the patch   -Press the button on the console when symptoms occur   -Avoid excessive sweating to maximize wear time   -Not submerge in water   -Keep lotions away from the patch   -Take brief showers with back to shower head   -Return the patch and log in prepaid box    Patient will receive follow up instructions once the final report is generated and reviewed by the ordering provider.  All questions answered at this time.

## 2023-09-14 ENCOUNTER — Telehealth (HOSPITAL_BASED_OUTPATIENT_CLINIC_OR_DEPARTMENT_OTHER): Payer: Self-pay

## 2023-09-14 NOTE — Telephone Encounter (Signed)
 LVM with Pt advising of Lab orders for pre op UA being sent to her [facility] lab for collection this weekend.     09/12/23 UA WNL

## 2023-09-17 NOTE — Anesthesia Preprocedure Evaluation (Signed)
 Patient: Cassie Contreras  Procedure Information       Date/Time: 09/27/23 1549    Procedure: CYSTOSCOPY, WITH INJECTION OF BOTULINUM TOXIN OR BULKING AGENT - Bulkamid injection    Location: [facility] [place] [facility] OR    Surgeons: Cleotilde Slater Kay, MD            HPI: 73 year old female with history of mixed urinary incontinence, lifelong UTIs on vaginal estrogen, and stress urinary incontinence.     Relevant Problems   Anesthesia   (+) Obstructive sleep apnea      Pulmonary   (+) Obstructive sleep apnea      Neuro/Psych   (+) Migraine with aura and without status migrainosus, not intractable   (+) New onset of headaches after age 15      Cardio   (+) Benign hypertension   (+) Migraine with aura and without status migrainosus, not intractable     Relevant surgical history:   Past Surgical History:   Procedure Laterality Date    PR UNLISTED PROCEDURE FOOT/TOES      PR UNLISTED PROCEDURE HANDS/FINGERS      PR UNLISTED PROCEDURE LEG/ANKLE      Tonsilectomy      Childhood    URETHRAL DILATION/DVIU           Medications:     Outpatient:   Current Outpatient Medications   Medication Instructions    clindamycin  1 % gel Topical, 2 times daily, Apply to face.    clonazePAM  (KLONOPIN ) 0.5 mg, Oral, Daily PRN    estradiol  10 MCG vaginal tablet place 1 tablet vaginally three times a week    memantine  (NAMENDA ) 10 mg, Oral, Daily    nitrofurantoin  macrocrystal (MACRODANTIN ) 100 mg, Oral, Daily    omeprazole  (PRILOSEC) 20 mg, Oral, Daily empty stomach    tretinoin  0.05 % cream Topical, Nightly, Apply to affected areas on face.    valACYclovir  (VALTREX ) 1,000 mg, Oral, 2 times daily, For 5 days. May repeat if needed.                Review of patient's allergies indicates:  Allergies   Allergen Reactions    Erythromycin Other     Strange smells but no rash or trouble breathing.    Ibuprofen       TX FROM PAPER CHART      Pneumovax [Pneumococcal Polysaccharide Vaccine]      Rash/fever (arm)     Sulfa Antibiotics      TX FROM  PAPER CHART         Social History:   Social History     Tobacco Use    Smoking status: Former     Current packs/day: 0.00     Average packs/day: 0.5 packs/day for 15.0 years (7.5 ttl pk-yrs)     Types: Cigarettes     Start date: 01/24/1976     Quit date: 01/24/1991     Years since quitting: 32.6     Passive exposure: Past    Smokeless tobacco: Never    Tobacco comments:     I smoked for about 10 years, quit for about 6 years and smoked again for about 5. Ended 1993   Substance Use Topics    Alcohol use: Yes     Alcohol/week: 3.0 standard drinks of alcohol     Types: 3 Glasses of wine per week     Comment: occ.    Drug use: No  Medical History and Review of Systems  Documentation reviewed: Patient health questionnaire and electronic medical record.    Source of information: Chart review.  Previous anesthesia: Yes   History of anesthetic complications  (-) History of anesthetic complications.      Functional Status   Able to lay flat and still for 30 minutes.       Pulmonary   Neg pulmonary ROS    Neuro/Psych   Neg neuro/psych ROS    Cardiovascular   Neg cardio ROS    HEENT   Neg HEENT ROS    Musculoskeletal   (+) osteoarthritis (neck)    GI/Hepatic/Renal   (+) GERD  (+) genitourinary problem (long h/o frequent utis,stress incontinence)  (+) urinary tract infection    Endo/Immunology   neg endo/other ROS    Hematology   negative hematology ROS  Oncology Hx squamous cell ca ,chest 2009                   PHYSICAL EXAM     Labs: (last year)   No labs identified within the last year      Relevant procedures / diagnostic studies:     Perioperative Risk Scores:    [facility]  Total Score: (Patient-Rptd) 42.7    V02Peak: (Patient-Rptd) 27.96   METS: (Patient-Rptd) 7.99    PONV: Intermediate Risk  Total Score: 2            Female patient     Non-smoker    Criteria that do not apply:    History of PONV    History of motion sickness    Intended opioid administration             ANESTHESIA PLAN

## 2023-09-18 ENCOUNTER — Telehealth (HOSPITAL_COMMUNITY): Payer: Self-pay

## 2023-09-18 NOTE — Telephone Encounter (Signed)
 Updated home medication list prior to surgery

## 2023-09-19 NOTE — Telephone Encounter (Signed)
 Preparing for Surgery    You are scheduled to have the following procedure: Bladder botox    Day of Surgery: 09/27/23  [address]  [facility]    Surgeon: Slater Pinal      Reminders:  You MUST have a responsible adult prepared to take you home after the surgery.     [X]  Labs needed 7-10 days prior to surgery (Urine culture WNL and completed on 09/12/23 )  If your lab is outside [place] please ensure to provide your test results by the Monday piror to surgery for review.     Medication Instructions:  Hold any herbal supplements, Omega 3/Fish oil, multivitamins, all NSAIDS (Ibuprofen , Naproxen, Meloxicam, Diclofenac, etc), and aspirin 7 days prior to surgery.  Pre-Surgery Instructions:   Medication Instructions    calcium  carbonate 1500 (600 Ca) MG tablet Hold 1 week prior to surgery    Benadryl  25mg  TAB QHS Hold day of surgery     Preserve vision OTC supplement Hold 1 week - ask MD for preference    estradiol  10 MCG vaginal tablet Hold morning of surgery    ivermectin 1 % cream Hold morning of surgery    magnesium oxide 400 MG tablet Hold 1 week prior to surgery    memantine  10 MG tablet Hold morning of surgery    Multiple Vitamins-Minerals (PRESERVISION AREDS 2 OR) Hold 1 week prior to surgery    nitrofurantoin  macrocrystal 100 MG capsule Take day of surgery    omeprazole  20 MG DR capsule Take day of surgery    SELENIUM OR Hold 1 week prior to surgery    tretinoin  0.05 % cream Hold morning of surgery    Vitamin D3 Hold 1 week prior to surgery       Take a shower with an antibacterial soap, such as Hibiclens or Dial antibacterial soap, which you can purchase at your local drugstore.  Use the night before and the morning of your surgery.  Just soap up below the neck.  Do not use fragrances, deoderant/antiperspirant, hair products, lotions, mouthwash or makeup.    DO NOT eat any solid foods after midnight (including gums and mints) the night before surgery.   DO NOT drink alcohol after midnight, the night before  surgery.   You may drink CLEAR liquids, such as water, apple juice and cranberry juice; black coffee and black tea (no creamers and no milk) until 2 hours prior to your scheduled arrival time. Please don't drink any carbonated drinks and no pulp juices.     You will receive a call from the Surgery Services Scheduling Office 1-2 business days before surgery with your scheduled arrival time for the next day. They will only be able to tell you what time to arrive and where to park.  If you have other questions, please call the clinic [phone] or use your e-care account to message the clinical staff.  If you have not received a call from the Scheduling Office by 5PM the day before surgery, please call [phone].    DAY OF SURGERY  If you need to take medicine on the morning of your surgery, take it with only a small sip of water.  Take your second pre-surgical shower with an antibacterial soap, such as Hibiclens or Dial antibacterial soap, which you can purchase at your local drugstore.  Leave all jewelry and valuables at home.  Wear clean, loose and comfortable clothing.    _____________________________________________________________________    Constipation After Surgery  Constipation is a common problem after surgery. It can be prevented or managed with a few simple steps:   Take a mild laxative unless you have loose stools (Miralax is common)   Eat a diet high in fiber.   Increase fluids.   Walk.     What causes constipation after surgery?    A change in your regular eating habits   A decrease in fluid intake   A decrease in your daily activity   Taking narcotic pain medication    When to Call Your Doctor  To prevent problems with your healing process after surgery call your doctor if:    You have to strain hard to have a bowel movement.   It has been 3 days since your surgery, you have taken the steps to prevent/manage constipation, and you still have not had a bowel movement.   You are nauseated and  throwing up.   You feel dizzy or lightheaded when you stand up.   You have trouble urinating or a sense that the bladder isn't emptying.   You experience heavy bleeding, including bright red colored urine or large clots in the urine.   You feel burning with urination, changes to the color or smell of urine, chills and fever.     Please call the clinic at [phone]  if you have any questions or concerns.

## 2023-09-23 ENCOUNTER — Encounter (HOSPITAL_BASED_OUTPATIENT_CLINIC_OR_DEPARTMENT_OTHER): Payer: Self-pay | Admitting: Urology

## 2023-09-25 ENCOUNTER — Telehealth (HOSPITAL_BASED_OUTPATIENT_CLINIC_OR_DEPARTMENT_OTHER): Payer: Self-pay | Admitting: Urology

## 2023-09-25 NOTE — Telephone Encounter (Signed)
 Patient called. She has surgery scheduled with Dr Cleotilde on 09/27/23. She has some questions regarding the surgery that she would like to speak with a nurse about. Please contact her ASAP    Thank you!

## 2023-09-26 NOTE — Telephone Encounter (Signed)
 Encounter completed in duplicate messaging

## 2023-09-26 NOTE — Telephone Encounter (Signed)
 Confirmed injection medication to be Bulkamid tomorrow and not Botox. Reviewed shower instructions.

## 2023-09-27 ENCOUNTER — Ambulatory Visit
Admission: RE | Admit: 2023-09-27 | Discharge: 2023-09-27 | Disposition: A | Discharge status: Home / Self Care | Payer: Self-pay | Attending: Family Medicine | Admitting: Family Medicine

## 2023-09-27 ENCOUNTER — Encounter (HOSPITAL_COMMUNITY)
Admission: RE | Disposition: A | Discharge status: Home / Self Care | Payer: Self-pay | Source: Home / Self Care | Attending: Urology

## 2023-09-27 ENCOUNTER — Encounter (HOSPITAL_COMMUNITY): Payer: Self-pay

## 2023-09-27 ENCOUNTER — Ambulatory Visit (HOSPITAL_COMMUNITY): Payer: Self-pay | Admitting: Urology

## 2023-09-27 DIAGNOSIS — N393 Stress incontinence (female) (male): Secondary | ICD-10-CM | POA: Insufficient documentation

## 2023-09-27 LAB — CALCIUM: Calcium: 8.8 mg/dL — ABNORMAL LOW (ref 8.9–10.2)

## 2023-09-27 LAB — GLUCOSE POC, ~~LOC~~: Glucose (POC): 102 mg/dL (ref 62–125)

## 2023-09-27 SURGERY — CYSTOSCOPY, WITH INJECTION OF BOTULINUM TOXIN OR BULKING AGENT
Anesthesia: General

## 2023-09-27 MED ORDER — LACTATED RINGERS IV SOLN
10.0000 mL/h | INTRAVENOUS | Status: DC
Start: 2023-09-27 — End: 2023-09-28

## 2023-09-27 MED ORDER — CEFTRIAXONE SODIUM IN DEXTROSE 20 MG/ML IV SOLN
1.0000 g | INTRAVENOUS | Status: DC
Start: 2023-09-27 — End: 2023-09-28
  Filled 2023-09-27: qty 50

## 2023-09-27 MED ORDER — FENTANYL CITRATE (PF) 100 MCG/2ML IJ SOLN
INTRAMUSCULAR | Status: AC
Start: 2023-09-27 — End: 2023-09-27
  Filled 2023-09-27: qty 2

## 2023-09-27 MED ORDER — SUCCINYLCHOLINE CHLORIDE 200 MG/10ML IV SOSY
PREFILLED_SYRINGE | INTRAVENOUS | Status: AC
Start: 2023-09-27 — End: 2023-09-27
  Filled 2023-09-27: qty 10

## 2023-09-27 MED ORDER — PROPOFOL 200 MG/20ML IV EMUL
INTRAVENOUS | Status: AC
Start: 2023-09-27 — End: 2023-09-27
  Filled 2023-09-27: qty 20

## 2023-09-27 MED ORDER — LIDOCAINE 4 % EX CREA
TOPICAL_CREAM | CUTANEOUS | Status: DC | PRN
Start: 2023-09-27 — End: 2023-09-28

## 2023-09-28 ENCOUNTER — Telehealth (INDEPENDENT_AMBULATORY_CARE_PROVIDER_SITE_OTHER): Payer: Self-pay | Admitting: Family Medicine

## 2023-09-28 NOTE — Telephone Encounter (Signed)
 See mychart.

## 2023-09-29 NOTE — Telephone Encounter (Signed)
 Routing to Dr. [name]

## 2023-10-04 ENCOUNTER — Other Ambulatory Visit (INDEPENDENT_AMBULATORY_CARE_PROVIDER_SITE_OTHER): Payer: Self-pay | Admitting: Family Medicine

## 2023-10-04 ENCOUNTER — Encounter (INDEPENDENT_AMBULATORY_CARE_PROVIDER_SITE_OTHER): Payer: Self-pay

## 2023-10-04 DIAGNOSIS — N390 Urinary tract infection, site not specified: Secondary | ICD-10-CM

## 2023-10-04 DIAGNOSIS — K219 Gastro-esophageal reflux disease without esophagitis: Secondary | ICD-10-CM

## 2023-10-08 MED ORDER — OMEPRAZOLE 20 MG OR CPDR
20.0000 mg | DELAYED_RELEASE_CAPSULE | Freq: Every day | ORAL | 0 refills | Status: DC
Start: 2023-10-08 — End: 2023-10-31

## 2023-10-08 MED ORDER — ESTRADIOL 10 MCG VA TABS
10.0000 ug | ORAL_TABLET | VAGINAL | 2 refills | Status: DC
Start: 2023-10-08 — End: 2023-11-05

## 2023-10-08 NOTE — Telephone Encounter (Signed)
 Forwarding refill request to Dr Cleotilde and Urology clinical pool for review given recent attempted but cancelled surgery

## 2023-10-08 NOTE — Telephone Encounter (Signed)
 FOR PROVIDER:  The requested medication requires your authorization because it is outside of the [facility]'s protocols:     - patient >= 74 years of age  - please confirm ongoing need    If this medication is denied please have your staff inform the patient and schedule an appointment if necessary.        FOR FRONT DESK;  Patient is due for yearly exam.  One refill authorized.  Please schedule follow up visit.    2nd

## 2023-10-11 ENCOUNTER — Telehealth (INDEPENDENT_AMBULATORY_CARE_PROVIDER_SITE_OTHER): Payer: Self-pay | Admitting: Family Medicine

## 2023-10-11 ENCOUNTER — Other Ambulatory Visit (HOSPITAL_BASED_OUTPATIENT_CLINIC_OR_DEPARTMENT_OTHER): Payer: Self-pay

## 2023-10-11 NOTE — Telephone Encounter (Signed)
 Covering for Dr Gwyneth   Repeat calcium  lab ordered

## 2023-10-15 HISTORY — PX: TRANSTHORACIC ECHO (TTE) COMPLETE: ECH110

## 2023-10-16 NOTE — Telephone Encounter (Signed)
 Patient notified via MyChart.    Closing TE.

## 2023-10-18 ENCOUNTER — Other Ambulatory Visit (INDEPENDENT_AMBULATORY_CARE_PROVIDER_SITE_OTHER): Payer: Self-pay | Admitting: Family Medicine

## 2023-10-18 ENCOUNTER — Telehealth (INDEPENDENT_AMBULATORY_CARE_PROVIDER_SITE_OTHER): Payer: Self-pay | Admitting: Family Medicine

## 2023-10-18 DIAGNOSIS — R899 Unspecified abnormal finding in specimens from other organs, systems and tissues: Secondary | ICD-10-CM

## 2023-10-18 DIAGNOSIS — R001 Bradycardia, unspecified: Secondary | ICD-10-CM

## 2023-10-18 DIAGNOSIS — R42 Dizziness and giddiness: Secondary | ICD-10-CM

## 2023-10-18 NOTE — Telephone Encounter (Signed)
 S/w patient, still occ light-headedness and fatigue.  W/u included 14-day Zio patch w result below:  Duration: 14 d  Heart rate:  Maximum 158 bpm, minimum 46 bpm, average 70 beats minute  Patient triggered events:  Total triggers: 2  Triggers correlated with normal sinus rhythm,  short SVT runs  Ectopics:  PACs: Rare less than 1%  PVCs: Rare less than 1%  23 Supraventricular Tachycardia runs occurred, the run with the fastest interval lasting 12 beats with a max rate of 158 bpm, the longest lasting 16 beats with an avg rate of 99 bpm  Summary:  1. Dominant rhythm: Normal sinus rhythm  2. Rare PACs, PVCs, short SVT Runs  3. No atrial fibrillation or sustained ventricular tachyarrhythmias detected    TTE done but can't see report through Care Everywhere. Patient will send me a copy of the report.  Repotedly orthostatic vitals done but other than positive for orthostatics in 8/18 note I don't see specific readings.    Will order rhythm monitor from [facility] and work on getting patient further evaluation by me and/or [facility].

## 2023-10-19 ENCOUNTER — Encounter (HOSPITAL_COMMUNITY): Payer: Self-pay

## 2023-10-21 ENCOUNTER — Encounter (INDEPENDENT_AMBULATORY_CARE_PROVIDER_SITE_OTHER): Payer: Self-pay | Admitting: Family Medicine

## 2023-10-21 ENCOUNTER — Other Ambulatory Visit (HOSPITAL_BASED_OUTPATIENT_CLINIC_OR_DEPARTMENT_OTHER): Payer: Self-pay | Admitting: Urology

## 2023-10-21 DIAGNOSIS — N3642 Intrinsic sphincter deficiency (ISD): Secondary | ICD-10-CM | POA: Insufficient documentation

## 2023-10-22 ENCOUNTER — Encounter (INDEPENDENT_AMBULATORY_CARE_PROVIDER_SITE_OTHER): Payer: Self-pay | Admitting: Family Medicine

## 2023-10-22 NOTE — Telephone Encounter (Signed)
Patient scheduled for 10/22.    Closing TE.

## 2023-10-22 NOTE — Telephone Encounter (Signed)
 Peding patient response re 10/22 double book

## 2023-10-23 NOTE — Progress Notes (Signed)
 10/23/2023     REFERRING PROVIDER: Amon Porter CHRISTELLA Alaine, MD  [address], [address]  [place] ,  [place] [address]    CHIEF COMPLAINT:   Chief Complaint   Patient presents with   . Foot Problem       HISTORY OF PRESENT ILLNESS:   Patient with neuropathy presents today for right foot pain. Points beneath the right first metatarsal head as the primary source of pain. She has a history of sesamoiditis and has seen Dr. Koren Cramp for this issue in the past. 2.5 weeks ago she woke up in the middle of the night and had sharp, shooting intermittent pain which was above and beyond her typical pain. Wears [place]oka tennis shoes and custom orthotics.     Has history of left ankle surgery with Dr. Erick.        PAST MEDICAL HISTORY:   Past Medical History:   Diagnosis Date   . Anxiety 2000   . Arthritis 2008   . Cancer (HCC) 2007   . Cataract 2020   . GERD (gastroesophageal reflux disease) 2010   . Headache(784.0) 2014   . Neuromuscular disorder (HCC) 2014   . Skin disorder rosacea   . Ulcer 1965       CURRENT MEDICATIONS:   Current Outpatient Medications:   .  calcium  phosphate trib/vit D3 (CALCIUM  ADULT, CALCIUM  PHOS, ORAL), Take by mouth., Disp: , Rfl:   .  cyanocobalamin, vitamin B-12, (VITAMIN B-12 ORAL), Take by mouth., Disp: , Rfl:   .  cyclobenzaprine  (FLEXERIL ) 5 MG tablet, Take 1 tablet (5 mg total) by mouth as needed in the morning and 1 tablet (5 mg total) as needed at noon and 1 tablet (5 mg total) as needed in the evening for muscle spasms., Disp: , Rfl:   .  estradioL  10 mcg Tablet, Place 1 tablet (0.01 mg total) vaginally 3 (three) times a week., Disp: , Rfl:   .  memantine  (NAMENDA ) 10 MG tablet, Take 1 tablet (10 mg total) by mouth in the morning., Disp: , Rfl:   .  omeprazole  (PriLOSEC) 20 MG capsule, Take 1 capsule (20 mg total) by mouth in the morning., Disp: , Rfl:   .  pyridoxine HCl, vitamin B6, (VITAMIN B-6 ORAL), Take by mouth., Disp: , Rfl:   .  vit A/vit C/vit E/zinc/copper (PRESERVISION AREDS ORAL), Take by  mouth in the morning and before bedtime., Disp: , Rfl:      ALLERGIES:   Allergies   Allergen Reactions   . Erythromycin Other (See Comments)     Affected sense of smell.   . Ibuprofen       TX FROM PAPER CHART   . Pneumococcal 23-Valent Polysaccharide Vaccine      Rash/fever (arm)   . Sulfa (Sulfonamide Antibiotics) Diarrhea       SOCIAL HISTORY:   Social History     Socioeconomic History   . Marital status: Married     Spouse name: Not on file   . Number of children: Not on file   . Years of education: Not on file   . Highest education level: Not on file   Occupational History   . Not on file   Tobacco Use   . Smoking status: Former     Current packs/day: 0.00     Average packs/day: 0.5 packs/day for 10.0 years (5.0 ttl pk-yrs)     Types: Cigarettes     Start date: 11/18/1968  Quit date: 11/19/1978     Years since quitting: 44.9   . Smokeless tobacco: Never   . Tobacco comments:     I smoked of and on for about ten years   Substance and Sexual Activity   . Alcohol use: Yes     Alcohol/week: 2.0 - 3.0 standard drinks of alcohol     Types: 2 - 3 Glasses of wine per week     Comment: occ wine   . Drug use: Never   . Sexual activity: Not Currently     Partners: Male     Birth control/protection: Post-menopausal   Other Topics Concern   . Do You Get Regular Exercise Yes     Comment: gym 2x week, at home 3x week   . Have You Terrilee In The Last 3 Months? No   Social History Narrative   . Not on file     Social Drivers of Health     Financial Resource Strain: Low Risk  (04/05/2023)    Overall Financial Resource Strain (CARDIA)    . Difficulty of Paying Living Expenses: Not hard at all   Food Insecurity: No Food Insecurity (04/05/2023)    Hunger Vital Sign    . Worried About Programme researcher, broadcasting/film/video in the Last Year: Never true    . Ran Out of Food in the Last Year: Never true   Transportation Needs: No Transportation Needs (04/05/2023)    Transportation Needs    . Lack of Transportation (Non-Medical): No   Physical Activity:  Sufficiently Active (04/05/2023)    Exercise Vital Sign    . Days of Exercise per Week: 4 days    . Minutes of Exercise per Session: 40 min   Stress: Stress Concern Present (04/05/2023)    [employer] of Occupational Health - Occupational Stress Questionnaire    . Feeling of Stress : To some extent   Social Connections: Socially Integrated (04/05/2023)    Social Connection and Isolation Panel    . Frequency of Communication with Friends and Family: Three times a week    . Frequency of Social Gatherings with Friends and Family: Three times a week    . Attends Religious Services: More than 4 times per year    . Active Member of Clubs or Organizations: Yes    . Attends Banker Meetings: More than 4 times per year    . Marital Status: Married   Catering manager Violence: Not At Risk (04/05/2023)    Intimate Partner Violence    . Fear of Current or Ex-Partner: No    . Emotionally Abused: No    . Physically Abused: No    . Sexually Abused: No   Housing Stability: Not on file (04/05/2023)       FAMILY HISTORY:   Family History   Problem Relation Name Age of Onset   . Depression Mother Sheree Hastings         sucide   . Tobacco Use Mother Sheree Hastings    . Cancer Father Aureliano Hastings         Brain, terminal   . Thyroid  disease Father Aureliano Hastings    . Tobacco Use Father Aureliano Hastings    . Cancer Maternal Grandfather Patsy Rush         prostate   . Depression Maternal Grandfather Patsy Rush         sucide   . Stroke Maternal Grandmother Odetta Rush    . Cancer Paternal  Grandmother Glendale Hastings         bone, terminal   . Diabetes Paternal Grandmother Glendale Hastings    . Hypertension Sister Inocente    . Tobacco Use Sister Inocente        REVIEW OF SYSTEMS: Pertinent ROS noted above    PHYSICAL EXAMINATION:    Vitals:    10/23/23 1000   Pulse: 74   Temp: 37 C (98.6 F)   SpO2: 96%       Constitutional: Well nourished and no apparent distress.  Vascular: Pedal pulses palpable, brisk capillary  refill.  Dermatologic: Skin is supple, intact.  Neurologic: Diminished sensation to light touch.   Musculoskeletal: Tenderness sub right first metatarsal head. Pes cavus foot type. Decreased first metatarsophalangeal joint range of motion.      DIAGNOSTIC STUDIES:     XR ANKLE 2 VIEWS RIGHT  Result Date: 10/23/2023  Two views of the right ankle reveal ankle mortise maintained.     XR FOOT MINIMUM 3 VIEWS RIGHT  Result Date: 10/23/2023  Three views of the right foot reveal decreased first metatarsophalangeal joint space. Fragmentation of the sesamoid. Diffuse decreased bony demineralization. Prior fracture of the fourth metatarsal with callus formation.       ASSESSMENT:  1. Sesamoiditis    2. Pes cavus of both feet        PLAN:   Sesamoiditis vs neuropathic pain  Reviewed etiology, treatment and imaging  Continue supportive shoes and custom orthotics, consider further offloading of sesamoid in orthotics. Could consider series of steroid injections  Discussed ordered EMG/NCS for further work up of nerve pain. Could also consider trial of gabapentin or Lyrica  Consider MRI of the right foot for further evaluation of sesamoid. Prior MRI in 2021 at [facility]  Return to clinic if symptoms increase in frequency or begin to occur in contralateral limb  The patient indicates understanding of these issues and agrees with the plan.           Duwaine LITTIE Hancock, DPM        CC: Amon Eans CHRISTELLA Loveless, MD  [phone]  [place] ,  [place] [address]    This document was generated using custom templates, typing, and speech recognition software. It has been reviewed, however some misrecognitions or typos may escape detection.

## 2023-10-26 ENCOUNTER — Ambulatory Visit
Admission: RE | Admit: 2023-10-26 | Discharge: 2023-10-26 | Disposition: A | Discharge status: Home / Self Care | Attending: Family Medicine | Admitting: Family Medicine

## 2023-10-26 DIAGNOSIS — R001 Bradycardia, unspecified: Secondary | ICD-10-CM | POA: Insufficient documentation

## 2023-10-26 DIAGNOSIS — R42 Dizziness and giddiness: Secondary | ICD-10-CM | POA: Insufficient documentation

## 2023-10-26 NOTE — Procedure Nursing Note (Signed)
 Patient mailed a 14 Day Patch with instructions for use and return. Pre-paid return label included.    Tracking [id number] for return label.

## 2023-10-29 ENCOUNTER — Other Ambulatory Visit (HOSPITAL_BASED_OUTPATIENT_CLINIC_OR_DEPARTMENT_OTHER): Payer: Self-pay | Admitting: Urology

## 2023-10-29 ENCOUNTER — Other Ambulatory Visit (INDEPENDENT_AMBULATORY_CARE_PROVIDER_SITE_OTHER): Payer: Self-pay | Admitting: Family Medicine

## 2023-10-29 DIAGNOSIS — K219 Gastro-esophageal reflux disease without esophagitis: Secondary | ICD-10-CM

## 2023-10-29 DIAGNOSIS — N390 Urinary tract infection, site not specified: Secondary | ICD-10-CM

## 2023-10-31 MED ORDER — OMEPRAZOLE 20 MG OR CPDR
20.0000 mg | DELAYED_RELEASE_CAPSULE | Freq: Every day | ORAL | 0 refills | Status: AC
Start: 2023-10-31 — End: ?

## 2023-10-31 MED ORDER — NITROFURANTOIN MACROCRYSTAL 100 MG OR CAPS
100.0000 mg | ORAL_CAPSULE | Freq: Every day | ORAL | 1 refills | Status: AC
Start: 2023-10-31 — End: ?

## 2023-10-31 NOTE — Telephone Encounter (Signed)
NV 10/22

## 2023-11-02 ENCOUNTER — Telehealth (HOSPITAL_BASED_OUTPATIENT_CLINIC_OR_DEPARTMENT_OTHER): Payer: Self-pay | Admitting: Urology

## 2023-11-02 ENCOUNTER — Encounter (HOSPITAL_BASED_OUTPATIENT_CLINIC_OR_DEPARTMENT_OTHER): Payer: Self-pay | Admitting: Urology

## 2023-11-02 DIAGNOSIS — R32 Unspecified urinary incontinence: Secondary | ICD-10-CM

## 2023-11-02 NOTE — Telephone Encounter (Signed)
 Outbound call to patient to schedule surgery with Dr Cleotilde. Patient is scheduled for 02/28/24. She would like orders for urine test sent to her PCP at [place]  [facility].     Advised patient that she will need cardiac clearance. Patient states she is in the process of seeing cardiology. She states that she will let her PCP know about the clearance. I gave her the fax number for clinic so form can be faxed to us .     Advised patient that she will need to complete new PHA for anesthesia but it will be sent to her closer to date of surgery.     Please contact patient for RN teach as needed

## 2023-11-05 ENCOUNTER — Other Ambulatory Visit (HOSPITAL_BASED_OUTPATIENT_CLINIC_OR_DEPARTMENT_OTHER): Payer: Self-pay | Admitting: Urology

## 2023-11-05 DIAGNOSIS — N390 Urinary tract infection, site not specified: Secondary | ICD-10-CM

## 2023-11-05 NOTE — Telephone Encounter (Signed)
 The pharmacy is requesting a 90 day supply.

## 2023-11-06 MED ORDER — ESTRADIOL 10 MCG VA TABS
10.0000 ug | ORAL_TABLET | VAGINAL | 0 refills | Status: AC
Start: 2023-11-08 — End: ?

## 2023-11-13 ENCOUNTER — Telehealth (INDEPENDENT_AMBULATORY_CARE_PROVIDER_SITE_OTHER): Payer: Self-pay | Admitting: Family Medicine

## 2023-11-13 ENCOUNTER — Encounter (INDEPENDENT_AMBULATORY_CARE_PROVIDER_SITE_OTHER): Payer: Self-pay | Admitting: Family Medicine

## 2023-11-13 DIAGNOSIS — I951 Orthostatic hypotension: Secondary | ICD-10-CM

## 2023-11-13 NOTE — Progress Notes (Deleted)
 Enter Ambient Recording Consent Here    Assessment & Plan      END OF VISIT SUMMARY  PLEASE SEE BELOW FOR SUPPORTING DOCUMENTATION AND DETAILS.   -----------------------------------------------------------------------------------------------------------------      SUBJECTIVE:  [name] is presenting today w *** for the following issue(s):    History of Present Illness      Patient Active Problem List   Diagnosis    Os peroneum syndrome of left foot    Neuritis of left sural nerve    Recurrent UTI    Osteopenia of spine    Atrophic vaginitis    Post-menopause on HRT (hormone replacement therapy)    Thyroid  nodule: u/s monitoring, benign bx April 2025    Mixed incontinence    Pelvic floor weakness    Obstructive sleep apnea    Benign hypertension    Anxiety    Breast lump on right side at 11 o'clock position    Migraine with aura and without status migrainosus, not intractable    New onset of headaches after age 69    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)    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  No acute distress.  There were no vitals taken for this visit.   ***    -----------------------------------------------------------------------------------------------------------------  ALSO SEE THE BEGINNING OF THIS NOTE FOR ASSESSMENT AND PLAN  -----------------------------------------------------------------------------------------------------------------

## 2023-11-13 NOTE — Telephone Encounter (Signed)
Routing to Dr Tarry Kos to review bp readings

## 2023-11-13 NOTE — Telephone Encounter (Signed)
 S/w patient, saw [facility] physician on 10/13. Note reviewed. Patient reports orthostatics done but not seen in documentation.  Patient has been doing orthostatics and will send in home readings.  OK to cancel tomorrow.

## 2023-11-14 ENCOUNTER — Encounter (INDEPENDENT_AMBULATORY_CARE_PROVIDER_SITE_OTHER): Admitting: Family Medicine

## 2023-11-14 NOTE — Telephone Encounter (Signed)
 I spent 5 minutes on this encounter on 11/14/2023.

## 2023-11-15 DIAGNOSIS — I951 Orthostatic hypotension: Secondary | ICD-10-CM

## 2023-11-15 NOTE — Telephone Encounter (Signed)
 I spent a total time of 5 minutes on this encounter.  My time was spent in providing medical advice as noted in MyChart message(s) for this service.

## 2023-11-22 NOTE — Addendum Note (Signed)
 Addended by: GWYNETH DEBBY COUGH on: 11/22/2023 08:39 AM     Modules accepted: Orders

## 2023-11-22 NOTE — Telephone Encounter (Signed)
 S/w patient, still has light-headedness despite drinking Liquid IV electrolyte additives.  BPs showing decrease systolic 18-25 mmHg w changing positions from lying to standing.  HR and distolic bp relatively maintained.  Heart patch monitor result pending.  She's going to [facility] on Saturday so will add tests. See order detail.

## 2023-11-23 ENCOUNTER — Other Ambulatory Visit (INDEPENDENT_AMBULATORY_CARE_PROVIDER_SITE_OTHER)

## 2023-11-23 DIAGNOSIS — R42 Dizziness and giddiness: Secondary | ICD-10-CM

## 2023-11-23 DIAGNOSIS — R001 Bradycardia, unspecified: Secondary | ICD-10-CM

## 2023-11-24 ENCOUNTER — Other Ambulatory Visit (INDEPENDENT_AMBULATORY_CARE_PROVIDER_SITE_OTHER)

## 2023-11-24 DIAGNOSIS — I951 Orthostatic hypotension: Secondary | ICD-10-CM

## 2023-11-24 LAB — COMPREHENSIVE METABOLIC PANEL
ALT (GPT): 68 U/L — ABNORMAL HIGH (ref 7–33)
AST (GOT): 41 U/L — ABNORMAL HIGH (ref 9–38)
Albumin: 4.3 g/dL (ref 3.5–5.2)
Alkaline Phosphatase (Total): 122 U/L (ref 38–172)
Anion Gap: 7 (ref 4–12)
Bilirubin (Total): 0.4 mg/dL (ref 0.2–1.3)
Calcium: 9 mg/dL (ref 8.9–10.2)
Carbon Dioxide, Total: 28 meq/L (ref 22–32)
Chloride: 95 meq/L — ABNORMAL LOW (ref 98–108)
Creatinine: 0.72 mg/dL (ref 0.38–1.02)
Glucose: 96 mg/dL (ref 62–125)
Potassium: 4.9 meq/L (ref 3.6–5.2)
Protein (Total): 7 g/dL (ref 6.0–8.2)
Sodium: 130 meq/L — ABNORMAL LOW (ref 135–145)
Urea Nitrogen: 15 mg/dL (ref 8–21)
eGFR by CKD-EPI 2021: >60 mL/min/1.73_m2 (ref >59)

## 2023-11-24 LAB — FERRITIN: Ferritin: 133 ng/mL (ref 10–180)

## 2023-11-24 LAB — IRON BINDING CAPACITY (W/IRON, TRANSFERRIN & TRANSF SAT)
Iron, SRM: 82 ug/dL (ref 31–171)
Total Iron Binding Capacity: 399 ug/dL (ref 270–535)
Transferrin Saturation: 21 % (ref 10–45)
Transferrin: 285 mg/dL (ref 192–382)

## 2023-11-24 LAB — CBC, DIFF
% Basophils: 1 %
% Eosinophils: 5 %
% Immature Granulocytes: 0 %
% Lymphocytes: 40 %
% Monocytes: 12 %
% Neutrophils: 42 %
% Nucleated RBC: 0 %
Absolute Eosinophil Count: 0.29 10*3/uL (ref 0.00–0.50)
Absolute Lymphocyte Count: 2.26 10*3/uL (ref 1.00–4.80)
Basophils: 0.04 10*3/uL (ref 0.00–0.20)
Hematocrit: 38 % (ref 36.0–45.0)
Hemoglobin: 12.9 g/dL (ref 11.5–15.5)
Immature Granulocytes: 0.01 10*3/uL (ref 0.00–0.05)
MCH: 31.2 pg (ref 27.3–33.6)
MCHC: 34.2 g/dL (ref 32.2–36.5)
MCV: 91 fL (ref 81–98)
Monocytes: 0.69 10*3/uL (ref 0.00–0.80)
Neutrophils: 2.42 10*3/uL (ref 1.80–7.00)
Nucleated RBC: 0 10*3/uL
Platelet Count: 345 10*3/uL (ref 150–400)
RBC: 4.13 10*6/uL (ref 3.80–5.00)
RDW-CV: 12.6 % (ref 11.0–14.5)
WBC: 5.71 10*3/uL (ref 4.3–10.0)

## 2023-11-24 LAB — FOLATE & VITAMIN B12
Folate, SRM: >22 ng/mL (ref >5.8)
Vitamin B12 (Cobalamin): 955 pg/mL — ABNORMAL HIGH (ref 180–914)

## 2023-11-24 LAB — CALCIUM, (REFLEXIVE IONIZED)

## 2023-11-26 ENCOUNTER — Ambulatory Visit (INDEPENDENT_AMBULATORY_CARE_PROVIDER_SITE_OTHER): Payer: Self-pay | Admitting: Medical-Surgical

## 2023-11-26 ENCOUNTER — Telehealth (INDEPENDENT_AMBULATORY_CARE_PROVIDER_SITE_OTHER): Payer: Self-pay | Admitting: Family Medicine

## 2023-11-26 NOTE — Telephone Encounter (Signed)
 Please contact patient to reschedule ZA infusion. Thanks.  Debby MARLA Rye, MD

## 2023-11-27 ENCOUNTER — Ambulatory Visit (INDEPENDENT_AMBULATORY_CARE_PROVIDER_SITE_OTHER): Payer: Self-pay | Admitting: Family Medicine

## 2023-12-02 ENCOUNTER — Other Ambulatory Visit (HOSPITAL_BASED_OUTPATIENT_CLINIC_OR_DEPARTMENT_OTHER): Payer: Self-pay | Admitting: Neurology

## 2023-12-02 DIAGNOSIS — R519 Headache, unspecified: Secondary | ICD-10-CM

## 2023-12-04 NOTE — Telephone Encounter (Signed)
 This request falls outside refill center's protocols:     Memantine  - last seen 08/23/22 (>1 year ago) and unclear if patient has transferred care? To PCP for review.       If this medication is denied please have your staff inform the patient and schedule an appointment if necessary.

## 2023-12-05 ENCOUNTER — Ambulatory Visit (INDEPENDENT_AMBULATORY_CARE_PROVIDER_SITE_OTHER): Admitting: Podiatrist

## 2023-12-05 MED ORDER — MEMANTINE HCL 10 MG OR TABS
10.0000 mg | ORAL_TABLET | ORAL | 3 refills | Status: AC
Start: 2023-12-05 — End: ?

## 2023-12-07 ENCOUNTER — Telehealth (INDEPENDENT_AMBULATORY_CARE_PROVIDER_SITE_OTHER): Payer: Self-pay | Admitting: Family Medicine

## 2023-12-07 NOTE — Telephone Encounter (Signed)
 Received message from patient asking if she can't take acetaminophen  d/t recent LFT elevation then what can she take for a right pelvic muscle strain from working out.  Adv ibuprofen  200-600mg  tid prn pain with food and if needs more or longer then contact me.

## 2023-12-25 ENCOUNTER — Encounter (HOSPITAL_BASED_OUTPATIENT_CLINIC_OR_DEPARTMENT_OTHER): Payer: Self-pay | Admitting: Family Medicine

## 2023-12-25 ENCOUNTER — Other Ambulatory Visit (INDEPENDENT_AMBULATORY_CARE_PROVIDER_SITE_OTHER): Payer: Self-pay | Admitting: Family Medicine

## 2023-12-25 DIAGNOSIS — M810 Age-related osteoporosis without current pathological fracture: Secondary | ICD-10-CM

## 2024-01-01 ENCOUNTER — Telehealth (HOSPITAL_BASED_OUTPATIENT_CLINIC_OR_DEPARTMENT_OTHER): Payer: Self-pay

## 2024-01-01 ENCOUNTER — Other Ambulatory Visit (HOSPITAL_BASED_OUTPATIENT_CLINIC_OR_DEPARTMENT_OTHER): Payer: Self-pay

## 2024-01-01 NOTE — Telephone Encounter (Signed)
 Pre-call for Zoledronic  Acid Infusion Visit on [facility]:    Spoke with: V[place]    Infusion date: 01/02/24    Discussed the following:   - [facility] Location, operating hours  Mon-Fri 7 am-7 pm (except Holidays) and contact phone number [phone]   - MD's infusion orders   - Pre-visit labs   - Visit process   - [facility] Visitor (1 visitor only and no unattended minors) and masking policies   - Did the clinic educated them on Zoledronic  Acid? Yes, had infusion in the past   - Encouraged Oral Hydration before, during and after infusion.     - If they have any fever/runny nose/cold/flu/COVID symptoms, they will need to reschedule the appointment.    In addition, they were asked to consider the following questions and to NOTIFY PROVIDER if any of the answers are a yes prior to appointment:    - Has the patient had recent severe diarrhea/vomiting? No    - Has the patient been hospitalized within last 30 days of the infusion? No    - Has the patient had tooth extractions or dental implants done within the last 60 days of the infusion? No    - Any plans for future dental work in the next 60 days of scheduling this patient? No

## 2024-01-02 ENCOUNTER — Ambulatory Visit
Admission: RE | Admit: 2024-01-02 | Discharge: 2024-01-02 | Disposition: A | Discharge status: Home / Self Care | Attending: Family Medicine | Admitting: Family Medicine

## 2024-01-02 VITALS — BP 114/65 | HR 70 | Temp 97.0°F | Resp 16

## 2024-01-02 DIAGNOSIS — M810 Age-related osteoporosis without current pathological fracture: Secondary | ICD-10-CM

## 2024-01-02 DIAGNOSIS — I951 Orthostatic hypotension: Secondary | ICD-10-CM | POA: Insufficient documentation

## 2024-01-02 LAB — BASIC METABOLIC PANEL
Anion Gap: 7 (ref 4–12)
Calcium: 9.1 mg/dL (ref 8.9–10.2)
Carbon Dioxide, Total: 27 meq/L (ref 22–32)
Chloride: 98 meq/L (ref 98–108)
Creatinine: 0.98 mg/dL (ref 0.38–1.02)
Glucose: 99 mg/dL (ref 62–125)
Potassium: 3.8 meq/L (ref 3.6–5.2)
Sodium: 132 meq/L — ABNORMAL LOW (ref 135–145)
Urea Nitrogen: 22 mg/dL — ABNORMAL HIGH (ref 8–21)
eGFR by CKD-EPI 2021: >60 mL/min/1.73_m2 (ref >59)

## 2024-01-02 LAB — HEPATIC FUNCTION PANEL
ALT (GPT): 14 U/L (ref 7–33)
AST (GOT): 20 U/L (ref 9–38)
Albumin: 4.3 g/dL (ref 3.5–5.2)
Alkaline Phosphatase (Total): 66 U/L (ref 38–172)
Bilirubin (Direct): 0.1 mg/dL (ref 0.0–0.3)
Bilirubin (Total): 0.4 mg/dL (ref 0.2–1.3)
Protein (Total): 7.1 g/dL (ref 6.0–8.2)

## 2024-01-02 LAB — CBC (HEMOGRAM)
Hematocrit: 36 % (ref 36.0–45.0)
Hemoglobin: 12.6 g/dL (ref 11.5–15.5)
MCH: 31.5 pg (ref 27.3–33.6)
MCHC: 34.9 g/dL (ref 32.2–36.5)
MCV: 90 fL (ref 81–98)
Platelet Count: 315 10*3/uL (ref 150–400)
RBC: 4 10*6/uL (ref 3.80–5.00)
RDW-CV: 12.2 % (ref 11.0–14.5)
WBC: 6.66 10*3/uL (ref 4.3–10.0)

## 2024-01-02 MED ORDER — DIPHENHYDRAMINE HCL 50 MG OR CAPS: 50.0000 mg | ORAL_CAPSULE | ORAL | Status: DC | PRN

## 2024-01-02 MED ORDER — DIPHENHYDRAMINE HCL 50 MG/ML IJ SOLN: 50.0000 mg | INTRAMUSCULAR | Status: DC | PRN

## 2024-01-02 MED ORDER — METHYLPREDNISOLONE NA SUC (PF) 125 MG IJ SOLR: 125.0000 mg | Freq: Once | INTRAMUSCULAR | Status: DC | PRN

## 2024-01-02 MED ORDER — EPINEPHRINE (ANAPHYLAXIS) 1 MG/ML IJ SOLN: 0.3000 mg | Freq: Once | INTRAMUSCULAR | Status: DC | PRN

## 2024-01-02 MED ORDER — ACETAMINOPHEN 325 MG OR TABS: 650.0000 mg | ORAL_TABLET | ORAL | Status: DC | PRN

## 2024-01-02 MED ORDER — CALCIUM CARBONATE ANTACID 500 MG OR CHEW
500.0000 mg | CHEWABLE_TABLET | Freq: Once | ORAL | Status: AC
  Administered 2024-01-02: 500 mg via ORAL
  Filled 2024-01-02: qty 1

## 2024-01-02 MED ORDER — ZOLEDRONIC ACID 5 MG/100ML IV SOLN
5.0000 mg | Freq: Once | INTRAVENOUS | Status: AC
  Administered 2024-01-02: 5 mg via INTRAVENOUS
  Filled 2024-01-02: qty 100

## 2024-01-02 NOTE — Progress Notes (Signed)
 INFUSION RN NOTE    VISIT INFORMATION    Visit Diagnosis: (M81.0) Age-related osteoporosis without current pathological fracture  (primary encounter diagnosis)    Vitals:   Vitals:    01/02/24 1307   Temp: 36.1 C   Pulse: 70   BP: 114/65   Resp: 16   SpO2: 99%       Review of patient's allergies indicates:  Allergies   Allergen Reactions    Erythromycin Other     Strange smells but no rash or trouble breathing.    Ibuprofen       TX FROM PAPER CHART      Pneumovax [Pneumococcal Polysaccharide Vaccine]      Rash/fever (arm)     Sulfa Antibiotics      TX FROM PAPER CHART         MAR Administered Medications:  Medication Administrations This Visit         calcium  carbonate (Tums) chewable tablet 500 mg Admin Date  01/02/2024  11:53 Action  Given Dose  500 mg Rate   Route  Oral Site      Documented By: Herlene Norris, RN    Ordering Provider: Gwyneth Debby Cough, MD    NDC: [phone]      zoledronic  acid 5 mg in 100 mL IVPB (premix) 01/02/2024  12:03 New Bag/Syringe 5 mg 200 mL/hr Intravenous     Documented By: Herlene Norris, RN    Ordering Provider: Gwyneth Debby Cough, MD    [facility]: [id number]                01/02/2024    12:10 PM   Nursing Assessement   Orientation Level Oriented X4   Respiratory Pattern Normal   Respiratory Depth/Rhythm Regular   Head of Bed Elevated Self regulated   Integumentary (WDL) WDL   Wound 11/10/21 Incision Finger (Comment which one) Anterior;Right Wound 11/10/21 Incision Finger (Comment which one) Anterior;Right:  Date First Assessed: 11/10/21   Primary Wound Type: Incision   thumb   Location: Finger (Comment which one)   Wound Location Orientation: Anterior;Right   Musculoskeletal (WDL) WDL   Brace/Splint 11/10/21 Hand Anterior;Right Short;Splint Brace/Splint 11/10/21 Hand Anterior;Right Short;Splint:  Placement Date: 11/10/21   Placement Time: 10:38   Location: Hand   Orientation: Anterior;Right   Type: Short;Splint   Psychosocial (WDL) WDL       Comment       LABS:  No results  found for: CBCDIF, CMPBRCA12R, BMPROC, MAGNESIUM, PHOSPHATE, CRP    Laboratory Results Reviewed: Yes    Notes: Rudy arrived 4S on time, accompanied, for her second dose of Reclast  infusion. Pt. reported having flu like symptoms after her fist dose of Reclast .    Oriented to room, call light within reach.    Questionnaire    Special instructions regarding Zoledronic  Acid IV administration:  - Has the patient had recent severe diarrhea/vomiting? NO  - Has the patient been hospitalized within last 30 days of the infusion?  NO  - Has the patient had tooth extractions or dental implants done within the last 60 days of the infusion? NO    If yes, to any of the above questions, please notify provider before infusing.   - Encourage Oral Hydration during and post infusion.    Zoledronic  discharge educational leaflet reviewed and given to pt., pt. aware to go to the ED if they develop fevers/chills, SOB, lightheadedness, dizziness, chest pain, swelling of lips/tongue/throat.     IV PLACEMENT: IV placed uneventfully, Labs drawn  with IV start, Total number of attempts: 1, and Warmed with Hot Pack, blanket prior to insertion    Completed 5 rights.    Pre-medication given, except for tylenol , pt. declined taking tylenol , per provider stating not to take d/t elevated LFT's.    Pt. reviewed medication label, confirming their name, DOB, medication and dose.    PATIENT TOLERANCE: Patient tolerated infusion well with no signs and symptoms of infusion reaction observed or reported adverse effect and Patient monitored for 30 minutes post infusion    PLAN: Infusion series complete    Patient left unit independently: Ambulatory    Digitally signed by: Almarie Leghorn, RN

## 2024-01-07 ENCOUNTER — Ambulatory Visit (INDEPENDENT_AMBULATORY_CARE_PROVIDER_SITE_OTHER): Payer: Self-pay | Admitting: Medical-Surgical

## 2024-02-09 ENCOUNTER — Telehealth (HOSPITAL_COMMUNITY): Payer: Self-pay

## 2024-02-09 NOTE — Telephone Encounter (Signed)
 Thank you for reviewing your medication list with the inpatient pharmacy department prior to your procedure.      Reviewed medication list with patient prior to procedure via ML medrecon interview: phone interview with patient or caregiver who handles medications    Discrepancies:     Comments:

## 2024-02-12 NOTE — Addendum Note (Signed)
 Addended by: PRUDENCIO MAXWELL A on: 02/12/2024 10:04 AM     Modules accepted: Orders

## 2024-02-12 NOTE — Telephone Encounter (Signed)
 Cardiac clearance faxed to:  Dr. Wynell Gilles, MD   [facility]  [address], [place], [place] [address]  [phone] (phone)  [phone] (Fax)     UA order faxed to:  Dr. Porter Millard, MD  [address], [place] , [place] [address]  Phone:  [phone]  Fax:  [phone]    Patient provided hardcopy of UA order via [facility]    Information for upcoming surgery with Dr. Cleotilde on 02/28/2024 sent to the patient via [facility]. Patient reminded to obtain cardiac clearance.     calcium  carbonate 1500 (600 Ca) MG tablet - Hold morning of surgery    DOXYLAMINE SUCCINATE OR - Hold morning of surgery    estradiol  10 MCG vaginal tablet - Continue as directed    Glucosamine-Chondroitin (MOVE FREE OR) - Hold 7 days prior to surgery    magnesium oxide 400 MG tablet - Hold morning of surgery    memantine  10 MG tablet - Continue as directed    Multiple Vitamins-Minerals (PRESERVISION AREDS 2 OR) - Hold 7 days prior to surgery    nitrofurantoin  macrocrystal 100 MG capsule - Continue as directed    omeprazole  20 MG DR capsule - Continue as directed    PSYLLIUM FIBER OR - Hold morning of surgery

## 2024-02-15 ENCOUNTER — Encounter (INDEPENDENT_AMBULATORY_CARE_PROVIDER_SITE_OTHER): Payer: Self-pay | Admitting: Family Medicine

## 2024-02-18 ENCOUNTER — Telehealth (HOSPITAL_BASED_OUTPATIENT_CLINIC_OR_DEPARTMENT_OTHER): Payer: Self-pay | Admitting: Urology

## 2024-02-18 NOTE — Telephone Encounter (Signed)
 Patient called in and inquired if clearance for cardiology had been sent as patient stated cardiologist has not rcvd it yet     Patient left information to send to cardiologist for approval DOS 02/28/24 with Cleotilde MD:     - Cardiologist: Clancy MD // Willma RN  - [place]    - Fax: [phone]    Cardiologists requests the name of doc, the date of service, and the procedure that will be done     Informed that I will inform the care team for review and assistance

## 2024-02-19 NOTE — Telephone Encounter (Signed)
 Concerns addressed in TE from 11/02/23 for surgical planning.  Closing this encounter

## 2024-02-19 NOTE — Telephone Encounter (Signed)
 From TE on 02/18/24:  Patient called in and inquired if clearance for cardiology had been sent as patient stated cardiologist has not rcvd it yet      Patient left information to send to cardiologist for approval DOS 02/28/24 with Cleotilde MD:      - Cardiologist: Clancy MD // Willma RN  - [place]    - Fax: [phone]     Cardiologists requests the name of doc, the date of service, and the procedure that will be done      Informed that I will inform the care team for review and assistance           RN reviewed chart and unable to locate previous letter faxed.  New letter drafted and refaxed for cardiac clearance.    Contact info acquired from care everywhere cardiology note with Dr Clancy at clinic in [place] [facility]  Wynell Clancy, MD   [address], [place], [place] [address]  Dept: [phone] / Dept Fax: [phone]     RN attempted to reach clinic to confirm fax number and notify of letter sent, but no answer at number above and unable to leave message.    Letter faxed to [phone].  Confirmation receipt received.  Will f/u at end of week for return fax.

## 2024-02-22 ENCOUNTER — Encounter (HOSPITAL_BASED_OUTPATIENT_CLINIC_OR_DEPARTMENT_OTHER): Payer: Self-pay | Admitting: Urology

## 2024-02-28 ENCOUNTER — Ambulatory Visit
Admission: RE | Admit: 2024-02-28 | Discharge: 2024-02-28 | Disposition: A | Discharge status: Home / Self Care | Payer: Self-pay | Source: Home / Self Care | Attending: Urology | Admitting: Urology

## 2024-02-28 ENCOUNTER — Encounter (HOSPITAL_COMMUNITY): Payer: Self-pay

## 2024-02-28 ENCOUNTER — Ambulatory Visit (HOSPITAL_COMMUNITY): Admitting: Urology

## 2024-02-28 ENCOUNTER — Encounter (HOSPITAL_COMMUNITY)
Admission: RE | Disposition: A | Discharge status: Home / Self Care | Payer: Self-pay | Source: Home / Self Care | Attending: Urology

## 2024-02-28 DIAGNOSIS — N3642 Intrinsic sphincter deficiency (ISD): Secondary | ICD-10-CM | POA: Insufficient documentation

## 2024-02-28 DIAGNOSIS — N952 Postmenopausal atrophic vaginitis: Secondary | ICD-10-CM

## 2024-02-28 LAB — GLUCOSE POC, ~~LOC~~: Glucose (POC): 89 mg/dL (ref 62–125)

## 2024-02-28 MED ORDER — DEXAMETHASONE SOD PHOSPHATE PF 10 MG/ML IJ SOLN
INTRAMUSCULAR | Status: DC | PRN
  Administered 2024-02-28: 4 mg via INTRAVENOUS

## 2024-02-28 MED ORDER — FENTANYL CITRATE (PF) 50 MCG/ML IJ SOLN WRAPPER (ANESTHESIA OSM ONLY)
INTRAMUSCULAR | Status: DC | PRN
  Administered 2024-02-28 (×2): 50 ug via INTRAVENOUS

## 2024-02-28 MED ORDER — FENTANYL CITRATE (PF) 100 MCG/2ML IJ SOLN
INTRAMUSCULAR | Status: AC
  Filled 2024-02-28: qty 2

## 2024-02-28 MED ORDER — PHENYLEPHRINE HCL-NACL 1-0.9 MG/10ML-% IV SOSY
PREFILLED_SYRINGE | INTRAVENOUS | Status: DC | PRN
  Administered 2024-02-28 (×2): 100 ug via INTRAVENOUS

## 2024-02-28 MED ORDER — STERILE WATER FOR IRRIGATION IR SOLN 1000 ML BOTTLE - BACK TABLE USE ONLY (INTRAOP OSM ONLY)
Status: DC | PRN
  Administered 2024-02-28: 1000 mL

## 2024-02-28 MED ORDER — CEFTRIAXONE SODIUM IN DEXTROSE 20 MG/ML IV SOLN
1.0000 g | INTRAVENOUS | Status: AC
  Administered 2024-02-28: 1 g via INTRAVENOUS
  Filled 2024-02-28: qty 50

## 2024-02-28 MED ORDER — LIDOCAINE HCL PF 2% IV/IJ SOSY/SOLN WRAPPER (ANESTHESIA OSM ONLY)
INTRAVENOUS | Status: DC | PRN
  Administered 2024-02-28: 70 mg via INTRAVENOUS

## 2024-02-28 MED ORDER — PROPOFOL 10 MG/ML IV EMUL WRAPPER (OSM ONLY)
INTRAVENOUS | Status: DC | PRN
  Administered 2024-02-28: 130 mg via INTRAVENOUS
  Administered 2024-02-28: 50 mg via INTRAVENOUS

## 2024-02-28 MED ORDER — FENTANYL CITRATE (PF) 100 MCG/2ML IJ SOLN: 25.0000 ug | INTRAMUSCULAR | Status: DC | PRN

## 2024-02-28 MED ORDER — LACTATED RINGERS BOLUS: 500.0000 mL | Freq: Once | INTRAVENOUS | Status: DC | PRN

## 2024-02-28 MED ORDER — LACTATED RINGERS IV SOLN: 10.0000 mL/h | INTRAVENOUS | Status: DC

## 2024-02-28 MED ORDER — ACETAMINOPHEN 325 MG OR TABS
650.0000 mg | ORAL_TABLET | ORAL | Status: DC
  Administered 2024-02-28: 650 mg via ORAL
  Filled 2024-02-28: qty 2

## 2024-02-28 MED ORDER — LIDOCAINE 4 % EX CREA: TOPICAL_CREAM | CUTANEOUS | Status: DC | PRN

## 2024-02-28 MED ORDER — ONDANSETRON HCL 4 MG/2ML IJ SOLN
INTRAMUSCULAR | Status: DC | PRN
  Administered 2024-02-28: 4 mg via INTRAVENOUS

## 2024-02-28 MED ORDER — SODIUM CHLORIDE 0.9 % 1000 ML BAG FOR IRRIGATION (INTRAOP OSM ONLY)
INTRAVENOUS | Status: DC | PRN
  Administered 2024-02-28 (×2): 1000 mL

## 2024-02-28 MED ORDER — ESTRADIOL 0.01 % VA CREA: TOPICAL_CREAM | VAGINAL | 6 refills | Status: AC

## 2024-02-28 MED ORDER — ONDANSETRON HCL 4 MG/2ML IJ SOLN: 4.0000 mg | INTRAMUSCULAR | Status: DC | PRN

## 2024-02-28 MED ORDER — EPHEDRINE SULFATE (PRESSORS) 25 MG/5ML IV SOSY
PREFILLED_SYRINGE | INTRAVENOUS | Status: DC | PRN
  Administered 2024-02-28 (×2): 5 mg via INTRAVENOUS

## 2024-02-28 NOTE — Op Note (Signed)
 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  EBL: <1 mL   Specimens: None       Indication(s): Cassie Contreras is an 74 year old adult with urinary incontinence secondary to ISD.    Finding(s): Excellent coaptation of urethra after injections     Procedure Details:   After obtaining informed/written consent the patient was brought to the OR. Safety check was performed. After successful general anesthesia she was placed in the lithotomy position. Her genitalia were prepped and draped in the usual sterile manner. The Bulkamid sheath was then inserted into the urethra and advanced into the bladder. The bladder was drained. The sheath was then retracted into the urethra about 2cm from the bladder neck and a total of1.8cc of Bulkamid was injected at 4 standard sites with excellent coaptation of proximal urethra. All instruments were removed. .The bladder was I/O drained with a 10 fr catheter  and refilled with 150cc of saline. The catheter was then removed.  Patient was awakened from anesthesia and taken to PACU in satisfactory condition.I was present for the entire procedure.     Complications: None.     Condition: stable

## 2024-02-28 NOTE — Procedure Nursing Note (Signed)
 PACU Summary, Outpatient Discharge:    Patient summary/synopsis  Level of consciousness: awake, alert, and oriented  Nausea:None  Dressing(s) are: n/a, incision(s) open to air  Pain is present: No  Oral fluids taken: Yes  Voiding Status: Voided ; PVR 0  Precautions or restrictions: Fall    Home with: Written instructions.   Escort present:Yes

## 2024-02-28 NOTE — Discharge Instructions (Addendum)
 Self Care / Activity  OK to resume your regular diet  You may shower    Avoid vigorous activity for 24 hours then resume all normal activity.  Stay well hydrated. Drink at least 8 oz of water  every 2-3 hours  You may see some blood in your urine for a day or two. This is normal. Please call if it continues to get worse.     Medications  Take tylenol  and ibuprofen  as needed for pain. Continue your antibiotics. Can take 650 mg of Tylenol  again if needed at 2:00 pm. You received a dose in the Recovery Unit.    If prescribed pyridium , take as needed for bladder spasm or pain with urination. This medication turns your urine orange. This is normal.    Call / Return Precautions  Please call Urology clinic (during normal business hours) or [facility] [phone] (outside of normal business hours) if you develop any fevers greater than 101.5, chest pain, shortness of breath, severe nausea/vomiting, severe pain that is uncontrollable with medications and persistent, unilateral extremity swelling or any other urgent concerns. You may be instructed to go to the Emergency Room if necessary. You may need to tell them the name of your surgeon and which [place] ([facility], [facility] or [place]) you get your care    Follow up  Reach out to urology team when you are ready to schedule your next botox

## 2024-02-28 NOTE — Anesthesia Procedure Notes (Signed)
 Airway Placement    Staff:  Performing Provider: Caroll Mood, MD  Authorizing Provider: Caroll Mood, MD    Airway management:   Patient location: OR/Procedural area  Final airway type: Supraglottic airway  Intubation reason: General anesthesia/planned procedure    Induction:  Positioning: supine  Preoxygenation: Yes  Mask Ventilation: Grade 1 - Ventilated by mask     Intubation:    Number of Attempts: 1    Final Attempt   Airway Type: SGA  LMA Type: i-gel  LMA Size:4    Assessment:  Confirmation: waveform capnography  Difficult Airway: no  Procedure Abandoned: no    Date / Time Airway Secured / Re-Secured:  02/28/2024 9:18 AM    Final procedure comments:  Smooth SGA insertion assisted with tongue depressor

## 2024-02-28 NOTE — Anesthesia Postprocedure Evaluation (Addendum)
 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    Place of evaluation: PACU    Patient participation: patient participated    Level of consciousness: fully conscious    Patient pain control satisfaction: patient is satisfied with level of pain control    Airway patency: patent    Cardiovascular status during assessment: stable    Respiratory status during assessment: breathing comfortably    Anesthetic complications: no    Intravascular volume status assessment: euvolemic    Nausea / vomiting: patient is not experiencing nausea      Planned post-operative disposition at time of assessment: hospital discharge

## 2024-02-28 NOTE — H&P (Signed)
 History and Physical     Cassie Contreras [facility]) - DOB: 05/01/1950 (74 year old adult)  Gender Identity: Female  Pronouns: she/her/hers  PCP: Gwyneth Debby Cough, MD   Code Status: Full Code            SUBJECTIVE   HISTORY OF PRESENT ILLNESS:   Patient with  stress urinary incontinence secondary to ISD comes for transurethral bulking agent injections            HISTORY   Problem List[1]    Medical History[2]    Surgical History[3]    Social History[4]    Family History[5]       OUTPATIENT MEDICATIONS:   Current Outpatient Medications   Medication Instructions    calcium  carbonate 1500 (600 Ca) MG tablet 600 mg of ELEMENTAL calcium , 2 times daily    clindamycin  1 % gel Topical, 2 times daily, Apply to face.    clonazePAM  (KLONOPIN ) 0.5 mg, Oral, Daily PRN    DOXYLAMINE SUCCINATE OR 50 mg, Nightly    estradiol  (VAGIFEM ) 10 mcg, Vaginal, 2 times weekly    Glucosamine-Chondroitin (MOVE FREE OR) 1 tablet, Daily    ivermectin 1 % cream 1 application, Every evening    magnesium oxide (MAG-OX) 400 mg, Daily    memantine  (NAMENDA ) 10 mg, Oral, Daily    Multiple Vitamins-Minerals (PRESERVISION AREDS 2 OR) 1 tablet, 2 times daily    nitrofurantoin  macrocrystal (MACRODANTIN ) 100 mg, Oral, Daily    omeprazole  (PRILOSEC) 20 mg, Oral, Daily empty stomach    PSYLLIUM FIBER OR 3 capsules, Daily    tretinoin  0.05 % cream Topical, Nightly, Apply to affected areas on face.    valACYclovir  (VALTREX ) 1,000 mg, Oral, 2 times daily, For 5 days. May repeat if needed.       ALLERGIES:   Erythromycin, Ibuprofen , Pneumovax [pneumococcal polysaccharide vaccine], and Sulfa antibiotics      OBJECTIVE        T: 37 C (02/28/24 0726)  BP: 140/88 (02/28/24 0726)  HR: 68 (02/28/24 0726)  RR: 16 (02/28/24 0726)  SpO2: 99 % (02/28/24 0726) Room air   Vitals (Most recent in last 24 hrs)   T: 37 C (02/28/24 0726)  BP: 140/88 (02/28/24 0726)  HR: 68 (02/28/24 0726)  RR: 16 (02/28/24 0726)  SpO2: 99 % (02/28/24 0726) Room air  T range: Temp  Min: 37 C   Max: 37 C  Wt 149 lb 11.1 oz (67.9 kg)     Ht 5' 9.6 (1.768 m)     Body mass index is 21.73 kg/m.       Physical Exam  NAD  PULM: clear, normal excursion  CV: RR no audible murmur    Labs (last 24 hours):   Chemistries  CBC  LFT  Gases, other   - - - 89   -   AST: - ALT: -  -/-/-/-  -/-/-/-   - - -   - >< -  AP: - T bili: -  Lact (a): - Lact (v): -   eGFR: - Ca: -   -   Prot: - Alb: -  Trop I: - D-dimer: -   Mg: - PO4: -  ANC: -     BNP: - Anti-Xa: -     ALC: -    INR: -        UA negative        ASSESSMENT/PLAN      Stress urinary incontinence secondary to ISD to OR  for transurethral bulking agent injections.    Risks benefits reviewed. Patient states she wishes to proceed. Written consent obtained.                            [1]   Problem List  Diagnosis    Os peroneum syndrome of left foot    Neuritis of left sural nerve    Recurrent UTI    Osteopenia of spine    Atrophic vaginitis    Post-menopause on HRT (hormone replacement therapy)    Thyroid  nodule: u/s monitoring, benign bx April 2025    Mixed incontinence    Pelvic floor weakness    Obstructive sleep apnea    Benign hypertension    Anxiety    Breast lump on right side at 11 o'clock position    Migraine with aura and without status migrainosus, not intractable    New onset of headaches after age 31    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)    Rupture of ulnar collateral ligament of right thumb    Low back pain    Age-related osteoporosis without current pathological fracture    SUI (stress urinary incontinence, female)    Intrinsic sphincter deficiency (ISD)   [2]   Past Medical History:  Diagnosis Date    Abdominal pain     Anxiety     Arthritis     Asthma (HCC)     Constipation     Depression     GERD (gastroesophageal reflux disease)     Headache     Heart palpitations     History of irritable bowel syndrome     Hormone disorder     Metatarsal stress fracture of right foot     TX FROM PAPER CHART      Migraine     Osteoporosis     Primary  insomnia     Squamous cell carcinoma in situ of skin     Thyroid  disease     UTI (urinary tract infection)    [3]   Past Surgical History:  Procedure Laterality Date    PR UNLISTED PROCEDURE FOOT/TOES      PR UNLISTED PROCEDURE HANDS/FINGERS      PR UNLISTED PROCEDURE LEG/ANKLE      Tonsilectomy      Childhood    TRANSTHORACIC ECHO (TTE) COMPLETE  10/15/2023    Summary No significant valvular abnormalities. Normal left ventricular size and normal systolic function with EF 62%. No wall motion abnormalities. Normal right ventricular size and function. No significant pericardial effusion.    URETHRAL DILATION/DVIU     [4]   Social History  Tobacco Use    Smoking status: Former     Current packs/day: 0.00     Average packs/day: 0.5 packs/day for 15.0 years (7.5 ttl pk-yrs)     Types: Cigarettes     Start date: 01/24/1976     Quit date: 01/24/1991     Years since quitting: 33.1     Passive exposure: Past    Smokeless tobacco: Never    Tobacco comments:     I smoked for about 10 years, quit for about 6 years and smoked again for about 5. Ended 1993   Substance and Sexual Activity    Alcohol use: Yes     Alcohol/week: 3.0 standard drinks of alcohol     Types: 3 Glasses of wine per week     Comment: occ.  Drug use: No    Sexual activity: Yes     Partners: Male   [5]   Family History  Problem Relation Age of Onset    Heart Attack Sister 37        sudden heart attack in her sleep
